# Patient Record
Sex: Female | Born: 1990 | Race: White | Hispanic: No | Marital: Married | Smoking: Never smoker
Health system: Southern US, Community
[De-identification: ages and names within clinical notes are randomized; demographics above are authoritative.]

## PROBLEM LIST (undated history)

## (undated) DIAGNOSIS — E119 Type 2 diabetes mellitus without complications: Secondary | ICD-10-CM

## (undated) DIAGNOSIS — M549 Dorsalgia, unspecified: Secondary | ICD-10-CM

## (undated) DIAGNOSIS — K760 Fatty (change of) liver, not elsewhere classified: Secondary | ICD-10-CM

## (undated) DIAGNOSIS — E282 Polycystic ovarian syndrome: Secondary | ICD-10-CM

## (undated) DIAGNOSIS — G8929 Other chronic pain: Secondary | ICD-10-CM

## (undated) DIAGNOSIS — F419 Anxiety disorder, unspecified: Secondary | ICD-10-CM

## (undated) DIAGNOSIS — Z8619 Personal history of other infectious and parasitic diseases: Secondary | ICD-10-CM

## (undated) HISTORY — DX: Type 2 diabetes mellitus without complications: E11.9

## (undated) HISTORY — PX: EYE SURGERY: SHX253

## (undated) HISTORY — DX: Polycystic ovarian syndrome: E28.2

## (undated) HISTORY — DX: Fatty (change of) liver, not elsewhere classified: K76.0

## (undated) HISTORY — DX: Anxiety disorder, unspecified: F41.9

## (undated) HISTORY — DX: Other chronic pain: G89.29

## (undated) HISTORY — DX: Dorsalgia, unspecified: M54.9

## (undated) HISTORY — DX: Personal history of other infectious and parasitic diseases: Z86.19

---

## 2010-07-06 HISTORY — PX: WISDOM TOOTH EXTRACTION: SHX21

## 2014-02-03 LAB — HM PAP SMEAR: HM Pap smear: NORMAL

## 2014-09-19 ENCOUNTER — Telehealth: Payer: Self-pay

## 2014-09-19 NOTE — Telephone Encounter (Addendum)
Medication: Review, verify sig & reconcile(including outside meds):  yes Duplicates discarded: n/a DM supply source: n/a  Local pharmacy: [id number] - [place], [place] - [address]   Allergies verified: yes  Immunization Status: Prompted for insurance verification: yes Flu vaccine--declined Tdap--4-5 years ago  A/P:   Changes to FH, PSH or Personal Hx: Reviewed and updated Pap--02/2014-normal per patient--Jill Loreta AveWagner at [facility]   Care Teams Updated: ED/Hospital/Urgent Care Visits: No Prompted for: Updated insurance, contact information, forms:  yes Remind to bring: DPR information, advance directives: yes  To Discuss with Provider: Nothing at this time.

## 2014-09-20 ENCOUNTER — Ambulatory Visit (INDEPENDENT_AMBULATORY_CARE_PROVIDER_SITE_OTHER): Payer: BC Managed Care – PPO | Admitting: Family Medicine

## 2014-09-20 ENCOUNTER — Encounter: Payer: Self-pay | Admitting: Family Medicine

## 2014-09-20 VITALS — BP 120/78 | HR 86 | Temp 98.3°F | Resp 16 | Ht 65.5 in | Wt 210.4 lb

## 2014-09-20 DIAGNOSIS — F411 Generalized anxiety disorder: Secondary | ICD-10-CM | POA: Insufficient documentation

## 2014-09-20 DIAGNOSIS — E669 Obesity, unspecified: Secondary | ICD-10-CM

## 2014-09-20 DIAGNOSIS — E282 Polycystic ovarian syndrome: Secondary | ICD-10-CM | POA: Insufficient documentation

## 2014-09-20 NOTE — Progress Notes (Signed)
   Subjective:    Patient ID: Kimberly Diaz, female    DOB: 1991-02-07, 24 y.o.   MRN: [id number]  HPI New to establish.  OB/GYN- Dennard SchaumannJill Wagoner.  PCOS- no official dx b/c no US.  Started on OCPs in 2006.  Stopped OCPs last month.  Plan is to attempt pregnancy when desired and if difficulty will have complete work up.  Obesity- pt has family hx of DM, HTN.  Pt reports that recently she is very aware of hypoglycemia sxs.  Has acanthosis nigricans since HS.  Getting ~10,000 steps daily.  Not following a particular diet but is attempting healthy choices and moderation.  Pt admits to difficulty w/ soda or sugary drinks.  No CP, SOB, HAs, visual changes, abd pain, N/V.  Anxiety- ongoing problem.  Pt feels sxs are well managed on Celexa.   Review of Systems For ROS see HPI     Objective:   Physical Exam  Constitutional: She is oriented to person, place, and time. She appears well-developed and well-nourished. No distress.  HENT:  Head: Normocephalic and atraumatic.  Eyes: Conjunctivae and EOM are normal. Pupils are equal, round, and reactive to light.  Neck: Normal range of motion. Neck supple. No thyromegaly present.  Cardiovascular: Normal rate, regular rhythm, normal heart sounds and intact distal pulses.   No murmur heard. Pulmonary/Chest: Effort normal and breath sounds normal. No respiratory distress.  Abdominal: Soft. She exhibits no distension. There is no tenderness.  Musculoskeletal: She exhibits no edema.  Lymphadenopathy:    She has no cervical adenopathy.  Neurological: She is alert and oriented to person, place, and time.  Skin: Skin is warm and dry.  Psychiatric: She has a normal mood and affect. Her behavior is normal.  Vitals reviewed.         Assessment & Plan:

## 2014-09-20 NOTE — Patient Instructions (Signed)
Schedule your complete physical in 6 months We'll notify you of your lab results and make any changes if needed Continue to make healthy food choices and get regular exercise Call with any questions or concerns Welcome!  We're glad to have you!

## 2014-09-20 NOTE — Assessment & Plan Note (Signed)
New to provider, ongoing for pt.  Just stopped OCPs.  Following w/ GYN.  This, along w/ family hx and obesity, predisposes pt to glucost intolerance.  Check labs.  Will follow.

## 2014-09-20 NOTE — Assessment & Plan Note (Signed)
New to provider, ongoing for pt.  Will check labs to risk stratify and r/o thyroid abnormality or DM.  Stressed need for healthy food choices and regular exercise.  Will follow.

## 2014-09-20 NOTE — Progress Notes (Signed)
Pre visit review using our clinic review tool, if applicable. No additional management support is needed unless otherwise documented below in the visit note. 

## 2014-09-20 NOTE — Assessment & Plan Note (Signed)
New to provider, ongoing for pt.  sxs are well controlled on current dose of Celexa.  No changes at this time.

## 2014-09-21 LAB — BASIC METABOLIC PANEL
BUN: 11 mg/dL (ref 6–23)
CO2: 29 mEq/L (ref 19–32)
Calcium: 9.5 mg/dL (ref 8.4–10.5)
Chloride: 100 mEq/L (ref 96–112)
Creatinine, Ser: 0.64 mg/dL (ref 0.40–1.20)
GFR: 120.52 mL/min (ref >60.00)
Glucose, Bld: 181 mg/dL — ABNORMAL HIGH (ref 70–99)
Potassium: 3.9 mEq/L (ref 3.5–5.1)
Sodium: 135 mEq/L (ref 135–145)

## 2014-09-21 LAB — CBC WITH DIFFERENTIAL/PLATELET
Basophils Absolute: 0.1 10*3/uL (ref 0.0–0.1)
Basophils Relative: 0.5 % (ref 0.0–3.0)
Eosinophils Absolute: 0.3 10*3/uL (ref 0.0–0.7)
Eosinophils Relative: 2.8 % (ref 0.0–5.0)
HCT: 40.7 % (ref 36.0–46.0)
Hemoglobin: 14 g/dL (ref 12.0–15.0)
Lymphocytes Relative: 31.1 % (ref 12.0–46.0)
Lymphs Abs: 3.3 10*3/uL (ref 0.7–4.0)
MCHC: 34.5 g/dL (ref 30.0–36.0)
MCV: 85.2 fl (ref 78.0–100.0)
Monocytes Absolute: 0.5 10*3/uL (ref 0.1–1.0)
Monocytes Relative: 4.5 % (ref 3.0–12.0)
Neutro Abs: 6.5 10*3/uL (ref 1.4–7.7)
Neutrophils Relative %: 61.1 % (ref 43.0–77.0)
Platelets: 346 10*3/uL (ref 150.0–400.0)
RBC: 4.78 Mil/uL (ref 3.87–5.11)
RDW: 12.6 % (ref 11.5–15.5)
WBC: 10.7 10*3/uL — ABNORMAL HIGH (ref 4.0–10.5)

## 2014-09-21 LAB — HEPATIC FUNCTION PANEL
ALT: 63 U/L — ABNORMAL HIGH (ref 0–35)
AST: 61 U/L — ABNORMAL HIGH (ref 0–37)
Albumin: 4.2 g/dL (ref 3.5–5.2)
Alkaline Phosphatase: 68 U/L (ref 39–117)
Bilirubin, Direct: 0.1 mg/dL (ref 0.0–0.3)
Total Bilirubin: 0.6 mg/dL (ref 0.2–1.2)
Total Protein: 7.8 g/dL (ref 6.0–8.3)

## 2014-09-21 LAB — LIPID PANEL
Cholesterol: 142 mg/dL (ref 0–200)
HDL: 33.8 mg/dL — ABNORMAL LOW (ref >39.00)
LDL Cholesterol: 75 mg/dL (ref 0–99)
NonHDL: 108.2
Total CHOL/HDL Ratio: 4
Triglycerides: 167 mg/dL — ABNORMAL HIGH (ref 0.0–149.0)
VLDL: 33.4 mg/dL (ref 0.0–40.0)

## 2014-09-21 LAB — HEMOGLOBIN A1C: Hgb A1c MFr Bld: 6.8 % — ABNORMAL HIGH (ref 4.6–6.5)

## 2014-09-21 LAB — TSH: TSH: 1.79 u[IU]/mL (ref 0.35–4.50)

## 2014-09-26 ENCOUNTER — Other Ambulatory Visit: Payer: Self-pay | Admitting: General Practice

## 2014-09-26 DIAGNOSIS — R7309 Other abnormal glucose: Secondary | ICD-10-CM

## 2014-09-26 MED ORDER — METFORMIN HCL 500 MG PO TABS: 500.0000 mg | ORAL_TABLET | Freq: Two times a day (BID) | ORAL | Status: DC

## 2014-10-15 ENCOUNTER — Telehealth: Payer: Self-pay | Admitting: Family Medicine

## 2014-10-15 NOTE — Telephone Encounter (Signed)
[facility] Day - Client TELEPHONE ADVICE RECORD [facility] Patient Name: Kimberly KosJESSICA Diaz DOB: 1990-11-20 Initial Comment Caller states he has a fever of 101.7 and is congested. Nurse Assessment Nurse: Elwyn LadeBurress, RN, Misty StanleyLisa Date/Time (Eastern Time): 10/15/2014 3:32:35 PM Confirm and document reason for call. If symptomatic, describe symptoms. ---Caller states he has a fever of 101.7 and is congested; began yesterday with chest congestion and began fever yesterday; feels achy and weak with neck stiffness; mild cough and hurts neck more to cough; has vomited once today Has the patient traveled out of the country within the last 30 days? ---No Does the patient require triage? ---Yes Related visit to physician within the last 2 weeks? ---No Does the PT have any chronic conditions? (i.e. diabetes, asthma, etc.) ---Yes List chronic conditions. ---NIDDM Did the patient indicate they were pregnant? ---No Guidelines Guideline Title Affirmed Question Affirmed Notes Neck Pain or Stiffness [1] Fever > 100.5 F (38.1 C) AND [2] diabetes mellitus or weak immune system (e.g., HIV positive, cancer chemo, splenectomy, organ transplant, chronic steroids) Final Disposition User See Physician within 4 Hours (or PCP triage) Burress, RN, Misty StanleyLisa Comments No appts available today, [facility]

## 2014-10-16 NOTE — Telephone Encounter (Signed)
Called patient to follow-up.  Patient states she went to urgent care and is positive for flu.  She is resting and staying hydrated.

## 2014-10-29 ENCOUNTER — Ambulatory Visit: Payer: BC Managed Care – PPO | Admitting: Family Medicine

## 2014-10-31 ENCOUNTER — Ambulatory Visit: Payer: BC Managed Care – PPO | Admitting: Family Medicine

## 2014-11-15 ENCOUNTER — Ambulatory Visit: Payer: BC Managed Care – PPO | Admitting: Family Medicine

## 2014-11-19 ENCOUNTER — Ambulatory Visit (INDEPENDENT_AMBULATORY_CARE_PROVIDER_SITE_OTHER): Payer: BC Managed Care – PPO | Admitting: Family Medicine

## 2014-11-19 ENCOUNTER — Encounter: Payer: Self-pay | Admitting: Family Medicine

## 2014-11-19 VITALS — BP 122/80 | HR 84 | Temp 98.0°F | Resp 16 | Wt 201.0 lb

## 2014-11-19 DIAGNOSIS — E119 Type 2 diabetes mellitus without complications: Secondary | ICD-10-CM | POA: Insufficient documentation

## 2014-11-19 LAB — BASIC METABOLIC PANEL
BUN: 10 mg/dL (ref 6–23)
CO2: 27 mEq/L (ref 19–32)
Calcium: 9.6 mg/dL (ref 8.4–10.5)
Chloride: 100 mEq/L (ref 96–112)
Creatinine, Ser: 0.62 mg/dL (ref 0.40–1.20)
GFR: 124.85 mL/min (ref >60.00)
Glucose, Bld: 111 mg/dL — ABNORMAL HIGH (ref 70–99)
Potassium: 3.9 mEq/L (ref 3.5–5.1)
Sodium: 135 mEq/L (ref 135–145)

## 2014-11-19 NOTE — Assessment & Plan Note (Signed)
New dx for pt.  Last labs showed A1C of 6.8.  Pt is tolerating metformin w/o difficulty.  Has lost 9 lbs in 1 month- unclear if this was due to recent illness or improved dietary choices.  Check BMP to assess Cr and glucose.  Reviewed importance of regular exercise, low carb diet and what her new dx entails- quarterly visits, routine eye exams.  Pt expressed understanding and is in agreement w/ plan.

## 2014-11-19 NOTE — Patient Instructions (Signed)
Follow up in 3 months to recheck sugars We'll notify you of your lab results and make any changes if needed Keep up the good work on healthy, low carb food choices and regular exercise Call with any questions or concerns Happy Memorial Day!!!

## 2014-11-19 NOTE — Progress Notes (Signed)
Pre visit review using our clinic review tool, if applicable. No additional management support is needed unless otherwise documented below in the visit note. 

## 2014-11-19 NOTE — Progress Notes (Signed)
   Subjective:    Patient ID: Kimberly Diaz, female    DOB: 08-14-1990, 24 y.o.   MRN: [id number]  HPI DM- new dx for pt.  At last OV, A1C was 6.8.  Was started on Metformin.  Has lost 9 lbs in last month but had flu for last 3 weeks.  No nausea, vomiting, diarrhea w/ metformin.  Pt admits to missing AM dose frequently but is at least taking medication once daily.  No abd pain.   Review of Systems For ROS see HPI     Objective:   Physical Exam  Constitutional: She is oriented to person, place, and time. She appears well-developed and well-nourished. No distress.  HENT:  Head: Normocephalic and atraumatic.  Eyes: Conjunctivae and EOM are normal. Pupils are equal, round, and reactive to light.  Neck: Normal range of motion. Neck supple. No thyromegaly present.  Cardiovascular: Normal rate, regular rhythm, normal heart sounds and intact distal pulses.   No murmur heard. Pulmonary/Chest: Effort normal and breath sounds normal. No respiratory distress.  Abdominal: Soft. She exhibits no distension. There is no tenderness.  Musculoskeletal: She exhibits no edema.  Lymphadenopathy:    She has no cervical adenopathy.  Neurological: She is alert and oriented to person, place, and time.  Skin: Skin is warm and dry.  Psychiatric: She has a normal mood and affect. Her behavior is normal.  Vitals reviewed.         Assessment & Plan:

## 2015-01-09 LAB — HM DIABETES EYE EXAM

## 2015-02-05 ENCOUNTER — Telehealth: Payer: Self-pay | Admitting: Family Medicine

## 2015-02-05 NOTE — Telephone Encounter (Signed)
Called pt and LMOVM, advised of Dr. Abner Greenspan recommendations and left number for [facility] [phone]. She should contact them for further review of testing and follow up from travels.

## 2015-02-05 NOTE — Telephone Encounter (Signed)
There is a test in the computer but I am not sure where insurance sits with this if no symptoms if she went to [facility]/immunization clinic they would know better.

## 2015-02-05 NOTE — Telephone Encounter (Signed)
Please advise, can we do testing here or should pt contact [facility]?

## 2015-02-05 NOTE — Telephone Encounter (Signed)
Caller name: Stefanee Mckell Relationship to patient: self Can be reached: [phone]  Reason for call: Pt called to see if we offer Zeka testing. She was in [place] and was bit by mosquitos. She isn't showing symptoms but wanted to test as a precaution. Please f/u with pt if needed.

## 2015-02-19 ENCOUNTER — Ambulatory Visit (INDEPENDENT_AMBULATORY_CARE_PROVIDER_SITE_OTHER): Payer: BC Managed Care – PPO | Admitting: Family Medicine

## 2015-02-19 ENCOUNTER — Encounter: Payer: Self-pay | Admitting: Family Medicine

## 2015-02-19 ENCOUNTER — Telehealth: Payer: Self-pay | Admitting: Family Medicine

## 2015-02-19 VITALS — BP 120/80 | HR 84 | Temp 98.0°F | Resp 16 | Ht 66.0 in | Wt 201.5 lb

## 2015-02-19 DIAGNOSIS — E119 Type 2 diabetes mellitus without complications: Secondary | ICD-10-CM

## 2015-02-19 LAB — BASIC METABOLIC PANEL
BUN: 13 mg/dL (ref 6–23)
CO2: 29 mEq/L (ref 19–32)
Calcium: 9.9 mg/dL (ref 8.4–10.5)
Chloride: 101 mEq/L (ref 96–112)
Creatinine, Ser: 0.57 mg/dL (ref 0.40–1.20)
GFR: 137.29 mL/min (ref >60.00)
Glucose, Bld: 99 mg/dL (ref 70–99)
Potassium: 4 mEq/L (ref 3.5–5.1)
Sodium: 136 mEq/L (ref 135–145)

## 2015-02-19 LAB — MICROALBUMIN / CREATININE URINE RATIO
Creatinine,U: 139.8 mg/dL
Microalb Creat Ratio: 0.8 mg/g (ref 0.0–30.0)
Microalb, Ur: 1.1 mg/dL (ref 0.0–1.9)

## 2015-02-19 LAB — HEMOGLOBIN A1C: Hgb A1c MFr Bld: 5.5 % (ref 4.6–6.5)

## 2015-02-19 MED ORDER — METFORMIN HCL ER 500 MG PO TB24: 500.0000 mg | ORAL_TABLET | Freq: Every day | ORAL | Status: DC

## 2015-02-19 NOTE — Progress Notes (Signed)
Pre visit review using our clinic review tool, if applicable. No additional management support is needed unless otherwise documented below in the visit note. 

## 2015-02-19 NOTE — Patient Instructions (Signed)
Schedule your complete physical in 3-4 months We'll notify you of your lab results and make any changes if needed Continue to work on healthy diet and regular exercise Call with any questions or concerns GOOD LUCK WITH BACK TO SCHOOL!!!!

## 2015-02-19 NOTE — Telephone Encounter (Signed)
will notify provider that appt is for January. Ok to add to cancellation list. For fasting it is ok for pt's to eat breakfast and skip lunch for afternoon appts.

## 2015-02-19 NOTE — Telephone Encounter (Signed)
°  Relation to EX:BMWU Call back number:[phone] Pharmacy:  Reason for call: dr.t wanted to cancel pt appt for sept for her CPE, there are no available slots for Nov. Ok to use 2 slots? Pt states she need morning appt because she has to fast

## 2015-02-19 NOTE — Assessment & Plan Note (Signed)
Pt is tolerating metformin w/o difficulty.  UTD on eye exam.  Will check microalbumin today.  Goal is to eventually wean metformin as A1C is controlled w/ healthy diet and regular exercise.  Will continue to follow closely.  Referral made to diabetes education.

## 2015-02-19 NOTE — Progress Notes (Signed)
   Subjective:    Patient ID: Kimberly Diaz, female    DOB: Nov 17, 1990, 24 y.o.   MRN: [id number]  HPI DM- ongoing issue for pt.  On Metformin  BID- will occasionally forget the AM dose.  UTD on eye exam.  Due for microalbumin and foot exam.  Not checking CBGs at home.  No CP, SOB, HAs, visual changes, abd pain, N/V.  Some symptomatic lows when pt has not eaten.  No numbness/tingling of hands/feet.   Review of Systems For ROS see HPI     Objective:   Physical Exam  Constitutional: She is oriented to person, place, and time. She appears well-developed and well-nourished. No distress.  HENT:  Head: Normocephalic and atraumatic.  Eyes: Conjunctivae and EOM are normal. Pupils are equal, round, and reactive to light.  Neck: Normal range of motion. Neck supple. No thyromegaly present.  Cardiovascular: Normal rate, regular rhythm, normal heart sounds and intact distal pulses.   No murmur heard. Pulmonary/Chest: Effort normal and breath sounds normal. No respiratory distress.  Abdominal: Soft. She exhibits no distension. There is no tenderness.  Musculoskeletal: She exhibits no edema.  Lymphadenopathy:    She has no cervical adenopathy.  Neurological: She is alert and oriented to person, place, and time.  Skin: Skin is warm and dry.  Psychiatric: She has a normal mood and affect. Her behavior is normal.  Vitals reviewed.         Assessment & Plan:

## 2015-02-19 NOTE — Telephone Encounter (Signed)
Pt has been scheduled for 07/28/14

## 2015-02-20 ENCOUNTER — Encounter: Payer: Self-pay | Admitting: General Practice

## 2015-03-13 ENCOUNTER — Ambulatory Visit: Payer: BC Managed Care – PPO | Admitting: Family Medicine

## 2015-04-09 ENCOUNTER — Ambulatory Visit: Payer: BC Managed Care – PPO | Admitting: *Deleted

## 2015-07-29 ENCOUNTER — Ambulatory Visit (INDEPENDENT_AMBULATORY_CARE_PROVIDER_SITE_OTHER): Payer: BC Managed Care – PPO | Admitting: Family Medicine

## 2015-07-29 ENCOUNTER — Encounter: Payer: Self-pay | Admitting: Family Medicine

## 2015-07-29 VITALS — BP 138/83 | HR 83 | Temp 98.2°F | Ht 66.0 in | Wt 210.0 lb

## 2015-07-29 DIAGNOSIS — Z0000 Encounter for general adult medical examination without abnormal findings: Secondary | ICD-10-CM | POA: Diagnosis not present

## 2015-07-29 DIAGNOSIS — E119 Type 2 diabetes mellitus without complications: Secondary | ICD-10-CM | POA: Diagnosis not present

## 2015-07-29 DIAGNOSIS — Z23 Encounter for immunization: Secondary | ICD-10-CM

## 2015-07-29 LAB — BASIC METABOLIC PANEL
BUN: 9 mg/dL (ref 6–23)
CO2: 25 mEq/L (ref 19–32)
Calcium: 9.1 mg/dL (ref 8.4–10.5)
Chloride: 102 mEq/L (ref 96–112)
Creatinine, Ser: 0.57 mg/dL (ref 0.40–1.20)
GFR: 136.81 mL/min (ref >60.00)
Glucose, Bld: 114 mg/dL — ABNORMAL HIGH (ref 70–99)
Potassium: 4.1 mEq/L (ref 3.5–5.1)
Sodium: 137 mEq/L (ref 135–145)

## 2015-07-29 LAB — CBC WITH DIFFERENTIAL/PLATELET
Basophils Absolute: 0 10*3/uL (ref 0.0–0.1)
Basophils Relative: 0.4 % (ref 0.0–3.0)
Eosinophils Absolute: 0.4 10*3/uL (ref 0.0–0.7)
Eosinophils Relative: 3.4 % (ref 0.0–5.0)
HCT: 43.2 % (ref 36.0–46.0)
Hemoglobin: 14.6 g/dL (ref 12.0–15.0)
Lymphocytes Relative: 33.1 % (ref 12.0–46.0)
Lymphs Abs: 3.5 10*3/uL (ref 0.7–4.0)
MCHC: 33.9 g/dL (ref 30.0–36.0)
MCV: 86.2 fl (ref 78.0–100.0)
Monocytes Absolute: 0.5 10*3/uL (ref 0.1–1.0)
Monocytes Relative: 4.5 % (ref 3.0–12.0)
Neutro Abs: 6.3 10*3/uL (ref 1.4–7.7)
Neutrophils Relative %: 58.6 % (ref 43.0–77.0)
Platelets: 346 10*3/uL (ref 150.0–400.0)
RBC: 5.01 Mil/uL (ref 3.87–5.11)
RDW: 12.7 % (ref 11.5–15.5)
WBC: 10.7 10*3/uL — ABNORMAL HIGH (ref 4.0–10.5)

## 2015-07-29 LAB — HEPATIC FUNCTION PANEL
ALT: 59 U/L — ABNORMAL HIGH (ref 0–35)
AST: 43 U/L — ABNORMAL HIGH (ref 0–37)
Albumin: 4.4 g/dL (ref 3.5–5.2)
Alkaline Phosphatase: 64 U/L (ref 39–117)
Bilirubin, Direct: 0.1 mg/dL (ref 0.0–0.3)
Total Bilirubin: 0.6 mg/dL (ref 0.2–1.2)
Total Protein: 7.8 g/dL (ref 6.0–8.3)

## 2015-07-29 LAB — HEMOGLOBIN A1C: Hgb A1c MFr Bld: 5.7 % (ref 4.6–6.5)

## 2015-07-29 LAB — LIPID PANEL
Cholesterol: 144 mg/dL (ref 0–200)
HDL: 36.1 mg/dL — ABNORMAL LOW (ref >39.00)
LDL Cholesterol: 78 mg/dL (ref 0–99)
NonHDL: 107.64
Total CHOL/HDL Ratio: 4
Triglycerides: 147 mg/dL (ref 0.0–149.0)
VLDL: 29.4 mg/dL (ref 0.0–40.0)

## 2015-07-29 LAB — VITAMIN D 25 HYDROXY (VIT D DEFICIENCY, FRACTURES): VITD: 22.55 ng/mL — ABNORMAL LOW (ref 30.00–100.00)

## 2015-07-29 LAB — TSH: TSH: 1.59 u[IU]/mL (ref 0.35–4.50)

## 2015-07-29 NOTE — Progress Notes (Signed)
Pre visit review using our clinic review tool, if applicable. No additional management support is needed unless otherwise documented below in the visit note. 

## 2015-07-29 NOTE — Addendum Note (Signed)
Addended by: Lurline Hare on: 07/29/2015 08:46 AM   Modules accepted: Orders

## 2015-07-29 NOTE — Assessment & Plan Note (Signed)
Pt's PE WNL w/ exception of obesity.  UTD on GYN.  Check labs.  Anticipatory guidance provided.  

## 2015-07-29 NOTE — Patient Instructions (Signed)
Follow up in 6 months to recheck sugar and A1C We'll notify you of your lab results and make any changes if needed Continue to work on healthy diet and regular exercise- you can do it! Call with any questions or concerns If you want to join [place] at the new [place] office, any scheduled appointments will automatically transfer and we will see you at [address] [place] [address], [place], [place] [address] (OPENING [place]) Happy New Year!!!

## 2015-07-29 NOTE — Assessment & Plan Note (Signed)
Chronic problem.  Byproduct of her PCOS.  A1C's have been very well controlled.  Now on 1500 mg Metformin daily per GYN.  UTD on foot exam, eye exam, microalbumin.  Check labs.  Adjust meds prn

## 2015-07-29 NOTE — Progress Notes (Signed)
   Subjective:    Patient ID: Kimberly Diaz, female    DOB: 1990/10/14, 25 y.o.   MRN: [id number]  HPI CPE- UTD on pap w/ Dr Ardelle Anton.  UTD on foot exam, eye exam, microalbumin.  Pt has gained 8 lbs since August.  Pt is currently on  Metformin XR w/ Dr Ardelle Anton.  Also on Femara and Progestin for cycle regularity.   Review of Systems Patient reports no vision/ hearing changes, adenopathy,fever, weight change,  persistant/recurrent hoarseness , swallowing issues, chest pain, palpitations, edema, persistant/recurrent cough, hemoptysis, dyspnea (rest/exertional/paroxysmal nocturnal), gastrointestinal bleeding (melena, rectal bleeding), abdominal pain, significant heartburn, bowel changes, GU symptoms (dysuria, hematuria, incontinence), Gyn symptoms (abnormal  bleeding, pain),  syncope, focal weakness, memory loss, numbness & tingling, skin/hair/nail changes, abnormal bruising or bleeding, anxiety, or depression.     Objective:   Physical Exam General Appearance:    Alert, cooperative, no distress, appears stated age, obese  Head:    Normocephalic, without obvious abnormality, atraumatic  Eyes:    PERRL, conjunctiva/corneas clear, EOM's intact, fundi    benign, both eyes  Ears:    Normal TM's and external ear canals, both ears  Nose:   Nares normal, septum midline, mucosa normal, no drainage    or sinus tenderness  Throat:   Lips, mucosa, and tongue normal; teeth and gums normal  Neck:   Supple, symmetrical, trachea midline, no adenopathy;    Thyroid: no enlargement/tenderness/nodules  Back:     Symmetric, no curvature, ROM normal, no CVA tenderness  Lungs:     Clear to auscultation bilaterally, respirations unlabored  Chest Wall:    No tenderness or deformity   Heart:    Regular rate and rhythm, S1 and S2 normal, no murmur, rub   or gallop  Breast Exam:    Deferred to GYN  Abdomen:     Soft, non-tender, bowel sounds active all four quadrants,    no masses, no organomegaly  Genitalia:     Deferred to GYN  Rectal:    Extremities:   Extremities normal, atraumatic, no cyanosis or edema  Pulses:   2+ and symmetric all extremities  Skin:   Skin color, texture, turgor normal, no rashes or lesions  Lymph nodes:   Cervical, supraclavicular, and axillary nodes normal  Neurologic:   CNII-XII intact, normal strength, sensation and reflexes    throughout          Assessment & Plan:

## 2015-07-30 ENCOUNTER — Other Ambulatory Visit: Payer: Self-pay

## 2015-07-30 ENCOUNTER — Telehealth: Payer: Self-pay | Admitting: Family Medicine

## 2015-07-30 MED ORDER — VITAMIN D (ERGOCALCIFEROL) 1.25 MG (50000 UNIT) PO CAPS: 50000.0000 [IU] | ORAL_CAPSULE | ORAL | Status: DC

## 2015-07-30 NOTE — Telephone Encounter (Signed)
Pt is returning your call for lab results   Please assist.  CB: [phone]   Thanks.

## 2015-08-02 ENCOUNTER — Telehealth: Payer: Self-pay | Admitting: Family Medicine

## 2015-08-02 ENCOUNTER — Ambulatory Visit: Payer: BC Managed Care – PPO | Admitting: Medical

## 2015-08-02 ENCOUNTER — Encounter: Payer: Self-pay | Admitting: Family Medicine

## 2015-08-02 ENCOUNTER — Ambulatory Visit (INDEPENDENT_AMBULATORY_CARE_PROVIDER_SITE_OTHER): Payer: BC Managed Care – PPO | Admitting: Family Medicine

## 2015-08-02 VITALS — BP 121/75 | HR 98 | Temp 98.0°F | Resp 20 | Wt 211.5 lb

## 2015-08-02 DIAGNOSIS — H65192 Other acute nonsuppurative otitis media, left ear: Secondary | ICD-10-CM | POA: Insufficient documentation

## 2015-08-02 DIAGNOSIS — J029 Acute pharyngitis, unspecified: Secondary | ICD-10-CM | POA: Diagnosis not present

## 2015-08-02 DIAGNOSIS — J039 Acute tonsillitis, unspecified: Secondary | ICD-10-CM | POA: Diagnosis not present

## 2015-08-02 LAB — POCT RAPID STREP A (OFFICE): Rapid Strep A Screen: NEGATIVE

## 2015-08-02 MED ORDER — PREDNISONE 50 MG PO TABS
50.0000 mg | ORAL_TABLET | Freq: Every day | ORAL | Status: DC
Start: 2015-08-02 — End: 2016-11-08

## 2015-08-02 MED ORDER — AMOXICILLIN-POT CLAVULANATE 875-125 MG PO TABS: 1.0000 | ORAL_TABLET | Freq: Two times a day (BID) | ORAL | Status: DC

## 2015-08-02 NOTE — Patient Instructions (Signed)

## 2015-08-02 NOTE — Telephone Encounter (Signed)
Relation to BJ:YNWG Call back [phone]   Reason for call:  Patient scheduled an acute appointment today at 1pm with KUNEFF, RENEE A at the [facility] office patient prefers to see PCP, patient wanted me to inform PCP is there anyway she can be squeezed in today. Please advise

## 2015-08-02 NOTE — Progress Notes (Signed)
Patient ID: Kimberly Diaz, female   DOB: 28-Oct-1990, 25 y.o.   MRN: [id number]   Subjective:    Patient ID: Kimberly Diaz   DOB: 10-Dec-1990, 25 y.o.    MRN: [id number]  HPI  Sore throat: Patient states she woke this morning with an extreme sore throat, it brings tears to swallow. She is feeling achy, mild congestion and fatigued.  She has been able to eat and drink. No fever, chills,nausea, vomit, diarrhea or rash. She is a first [employer]. She denies shortness of breath,but states it is difficult to swallow her own saliva.  Flu UTD 4 days ago, UTD with tdap    Past Medical History  Diagnosis Date  . PCOS (polycystic ovarian syndrome)     possible   . Chronic back pain     muscle stiffness  . Diabetes mellitus without complication (HCC)    No Known Allergies Past Surgical History  Procedure Laterality Date  . Wisdom tooth extraction  2012   Social History  Substance Use Topics  . Smoking status: Never Smoker   . Smokeless tobacco: Never Used  . Alcohol Use: Yes    Review of Systems Negative, with the exception of above mentioned in HPI     Objective:   Physical Exam BP 121/75 mmHg  Pulse 98  Temp(Src) 98 F (36.7 C)  Resp 20  Wt 211 lb 8 oz (95.936 kg)  SpO2 96%  LMP 07/27/2015 Body mass index is 34.15 kg/(m^2). Gen: Afebrile. No acute distress. Nontoxic in appearance. Well developed, well nourished, obese pleasant female HENT: AT. [place]. Bilateral TM visualized, with left TM effusion, mild erythema. MMM, no oral lesions. Bilateral nares with erythema and swelling. Throat with erythema, no exudates. Enlarged tonsils.  Cough not present, Hoarseness present TTP facial sinus not present. Difficulty swallowing Eyes:Pupils Equal Round Reactive to light, Extraocular movements intact,  Conjunctiva without redness, discharge or icterus. Neck/lymp/endocrine: Supple, bilateral shotty cervical anterior lymphadenopathy CV: RRR  Chest: CTAB, no wheeze or crackles Abd: Soft.  NTND. BS present Skin: no rashes, purpura or petechiae.    Assessment & Plan:  Kimberly Diaz is a 25 y.o. present for acute OV tonsillitis and left ear effusion.  1. Sore throat/tonsillitis/Acute effusion of left ear - POCT rapid strep A--> Negative.  - Flu  Swab --> negative - augmentin prescribed. Prednisone burst - rest, hydrate - F/U PRN

## 2015-09-13 ENCOUNTER — Telehealth: Payer: Self-pay | Admitting: Family Medicine

## 2015-09-13 ENCOUNTER — Ambulatory Visit: Payer: BC Managed Care – PPO | Admitting: Family Medicine

## 2015-09-13 NOTE — Telephone Encounter (Signed)
Please see if this is pt's first no show/late cancellation

## 2015-09-13 NOTE — Telephone Encounter (Signed)
Pt scheduled appt this morning at 8:11. Pt called back and  lvm at 12:43 to cancel appt for today at 2:15.   Should pt be charged?

## 2015-09-16 ENCOUNTER — Encounter: Payer: Self-pay | Admitting: Family Medicine

## 2015-09-16 NOTE — Telephone Encounter (Signed)
Waiving fee - mailing no show letter

## 2015-09-16 NOTE — Telephone Encounter (Signed)
No charge since 1st no-show.  There will be a charge in the future if no reason given

## 2015-09-16 NOTE — Telephone Encounter (Signed)
1st w/in 12 months

## 2015-11-13 DIAGNOSIS — N926 Irregular menstruation, unspecified: Secondary | ICD-10-CM | POA: Diagnosis not present

## 2015-12-30 DIAGNOSIS — N926 Irregular menstruation, unspecified: Secondary | ICD-10-CM | POA: Diagnosis not present

## 2016-01-06 LAB — HEMOGLOBIN A1C: Hemoglobin A1C: 6.6

## 2016-01-09 DIAGNOSIS — E119 Type 2 diabetes mellitus without complications: Secondary | ICD-10-CM | POA: Diagnosis not present

## 2016-01-09 DIAGNOSIS — N970 Female infertility associated with anovulation: Secondary | ICD-10-CM | POA: Diagnosis not present

## 2016-01-09 DIAGNOSIS — E282 Polycystic ovarian syndrome: Secondary | ICD-10-CM | POA: Diagnosis not present

## 2016-01-09 DIAGNOSIS — Z7984 Long term (current) use of oral hypoglycemic drugs: Secondary | ICD-10-CM | POA: Diagnosis not present

## 2016-01-09 DIAGNOSIS — F419 Anxiety disorder, unspecified: Secondary | ICD-10-CM | POA: Diagnosis not present

## 2016-01-09 DIAGNOSIS — Z79899 Other long term (current) drug therapy: Secondary | ICD-10-CM | POA: Diagnosis not present

## 2016-01-15 LAB — HM DIABETES EYE EXAM

## 2016-01-27 ENCOUNTER — Ambulatory Visit: Payer: BC Managed Care – PPO | Admitting: Family Medicine

## 2016-02-06 ENCOUNTER — Ambulatory Visit (INDEPENDENT_AMBULATORY_CARE_PROVIDER_SITE_OTHER): Payer: BC Managed Care – PPO | Admitting: Family Medicine

## 2016-02-06 ENCOUNTER — Encounter: Payer: Self-pay | Admitting: Family Medicine

## 2016-02-06 VITALS — BP 122/84 | HR 82 | Temp 98.6°F | Resp 16 | Ht 66.0 in | Wt 204.4 lb

## 2016-02-06 DIAGNOSIS — E119 Type 2 diabetes mellitus without complications: Secondary | ICD-10-CM | POA: Diagnosis not present

## 2016-02-06 DIAGNOSIS — Z3141 Encounter for fertility testing: Secondary | ICD-10-CM | POA: Diagnosis not present

## 2016-02-06 LAB — LIPID PANEL
Cholesterol: 162 mg/dL (ref 0–200)
HDL: 41.9 mg/dL (ref >39.00)
LDL Cholesterol: 92 mg/dL (ref 0–99)
NonHDL: 120.08
Total CHOL/HDL Ratio: 4
Triglycerides: 139 mg/dL (ref 0.0–149.0)
VLDL: 27.8 mg/dL (ref 0.0–40.0)

## 2016-02-06 LAB — BASIC METABOLIC PANEL
BUN: 6 mg/dL (ref 6–23)
CO2: 28 mEq/L (ref 19–32)
Calcium: 9.8 mg/dL (ref 8.4–10.5)
Chloride: 99 mEq/L (ref 96–112)
Creatinine, Ser: 0.54 mg/dL (ref 0.40–1.20)
GFR: 145.01 mL/min (ref >60.00)
Glucose, Bld: 91 mg/dL (ref 70–99)
Potassium: 4.3 mEq/L (ref 3.5–5.1)
Sodium: 136 mEq/L (ref 135–145)

## 2016-02-06 LAB — HEPATIC FUNCTION PANEL
ALT: 111 U/L — ABNORMAL HIGH (ref 0–35)
AST: 149 U/L — ABNORMAL HIGH (ref 0–37)
Albumin: 4.6 g/dL (ref 3.5–5.2)
Alkaline Phosphatase: 63 U/L (ref 39–117)
Bilirubin, Direct: 0.2 mg/dL (ref 0.0–0.3)
Total Bilirubin: 1 mg/dL (ref 0.2–1.2)
Total Protein: 8.1 g/dL (ref 6.0–8.3)

## 2016-02-06 LAB — MICROALBUMIN / CREATININE URINE RATIO
Creatinine,U: 144.4 mg/dL
Microalb Creat Ratio: 2.4 mg/g (ref 0.0–30.0)
Microalb, Ur: 3.5 mg/dL — ABNORMAL HIGH (ref 0.0–1.9)

## 2016-02-06 MED ORDER — METFORMIN HCL ER (MOD) 1000 MG PO TB24
2000.0000 mg | ORAL_TABLET | Freq: Every day | ORAL | 3 refills | Status: DC
Start: 2016-02-06 — End: 2016-03-27

## 2016-02-06 NOTE — Progress Notes (Signed)
Pre visit review using our clinic review tool, if applicable. No additional management support is needed unless otherwise documented below in the visit note. 

## 2016-02-06 NOTE — Assessment & Plan Note (Signed)
Pt's A1C had climbed to 6.6 last month and fertility specialist increased Metformin to 2000mg  XR daily.  UTD on eye exam.  Foot exam done today.  Microalbumin collected.  Stressed need for low carb diet and regular exercise.  Will follow.

## 2016-02-06 NOTE — Progress Notes (Signed)
   Subjective:    Patient ID: Kimberly Diaz, female    DOB: [birth year], 25 y.o.   MRN: [id number]  HPI DM- new dx.  Had labs checked last month w/ fertility specialist and A1C was 6.6  Metformin was increased to 2000mg  XR daily.  Not having N/V/D w/ medication changes.  Due for microalbumin, foot exam.  UTD on eye exam- .  Denies symptomatic lows.  No CP, SOB, HAs, visual changes, edema, numbness/tingling of hands/feet.   Review of Systems For ROS see HPI     Objective:   Physical Exam  Constitutional: She is oriented to person, place, and time. She appears well-developed and well-nourished. No distress.  obese  HENT:  Head: Normocephalic and atraumatic.  Eyes: Conjunctivae and EOM are normal. Pupils are equal, round, and reactive to light.  Neck: Normal range of motion. Neck supple. No thyromegaly present.  Cardiovascular: Normal rate, regular rhythm, normal heart sounds and intact distal pulses.   No murmur heard. Pulmonary/Chest: Effort normal and breath sounds normal. No respiratory distress.  Abdominal: Soft. She exhibits no distension. There is no tenderness.  Musculoskeletal: She exhibits no edema.  Lymphadenopathy:    She has no cervical adenopathy.  Neurological: She is alert and oriented to person, place, and time.  Skin: Skin is warm and dry.  Psychiatric: She has a normal mood and affect. Her behavior is normal.  Vitals reviewed.         Assessment & Plan:

## 2016-02-06 NOTE — Patient Instructions (Signed)
Schedule your complete physical for after Jan 23rd [facility] notify you of your lab results and make any changes if needed Continue to work on healthy diet and regular exercise- you can do it!! Call with any questions or concerns GOOD LUCK!!!!

## 2016-02-11 ENCOUNTER — Other Ambulatory Visit: Payer: Self-pay | Admitting: Family Medicine

## 2016-02-11 DIAGNOSIS — R945 Abnormal results of liver function studies: Principal | ICD-10-CM

## 2016-02-11 DIAGNOSIS — R7989 Other specified abnormal findings of blood chemistry: Secondary | ICD-10-CM

## 2016-02-12 DIAGNOSIS — Z3141 Encounter for fertility testing: Secondary | ICD-10-CM | POA: Diagnosis not present

## 2016-02-15 DIAGNOSIS — Z3141 Encounter for fertility testing: Secondary | ICD-10-CM | POA: Diagnosis not present

## 2016-02-18 DIAGNOSIS — Z3141 Encounter for fertility testing: Secondary | ICD-10-CM | POA: Diagnosis not present

## 2016-02-26 ENCOUNTER — Other Ambulatory Visit (INDEPENDENT_AMBULATORY_CARE_PROVIDER_SITE_OTHER): Payer: BC Managed Care – PPO

## 2016-02-26 DIAGNOSIS — R945 Abnormal results of liver function studies: Principal | ICD-10-CM

## 2016-02-26 DIAGNOSIS — R7989 Other specified abnormal findings of blood chemistry: Secondary | ICD-10-CM

## 2016-02-26 LAB — HEPATIC FUNCTION PANEL
ALT: 91 U/L — ABNORMAL HIGH (ref 6–29)
AST: 97 U/L — ABNORMAL HIGH (ref 10–30)
Albumin: 4.4 g/dL (ref 3.6–5.1)
Alkaline Phosphatase: 60 U/L (ref 33–115)
Bilirubin, Direct: 0.1 mg/dL (ref <=0.2)
Indirect Bilirubin: 0.5 mg/dL (ref 0.2–1.2)
Total Bilirubin: 0.6 mg/dL (ref 0.2–1.2)
Total Protein: 7.1 g/dL (ref 6.1–8.1)

## 2016-02-27 ENCOUNTER — Other Ambulatory Visit: Payer: Self-pay | Admitting: Family Medicine

## 2016-02-27 DIAGNOSIS — R7989 Other specified abnormal findings of blood chemistry: Secondary | ICD-10-CM

## 2016-02-27 DIAGNOSIS — R945 Abnormal results of liver function studies: Principal | ICD-10-CM

## 2016-02-27 LAB — HEPATITIS PANEL, ACUTE
HCV Ab: NEGATIVE
Hep A IgM: NONREACTIVE
Hep B C IgM: NONREACTIVE
Hepatitis B Surface Ag: NEGATIVE

## 2016-02-27 NOTE — Progress Notes (Signed)
Called pt and lmovm to inform of the lab results and to schedule a follow up appt.  labs ordered

## 2016-03-04 DIAGNOSIS — Z3200 Encounter for pregnancy test, result unknown: Secondary | ICD-10-CM | POA: Diagnosis not present

## 2016-03-05 DIAGNOSIS — Z3200 Encounter for pregnancy test, result unknown: Secondary | ICD-10-CM | POA: Diagnosis not present

## 2016-03-06 DIAGNOSIS — Z3200 Encounter for pregnancy test, result unknown: Secondary | ICD-10-CM | POA: Diagnosis not present

## 2016-03-11 ENCOUNTER — Telehealth: Payer: Self-pay | Admitting: Emergency Medicine

## 2016-03-11 NOTE — Telephone Encounter (Signed)
[facility] approved for Metformin XR covered by the [facility] [employer] [facility] Approved 03/10/2016-03/10/2017 [facility] by [facility] of [facility]

## 2016-03-25 DIAGNOSIS — O0901 Supervision of pregnancy with history of infertility, first trimester: Secondary | ICD-10-CM | POA: Diagnosis not present

## 2016-03-25 DIAGNOSIS — Z3A00 Weeks of gestation of pregnancy not specified: Secondary | ICD-10-CM | POA: Diagnosis not present

## 2016-03-26 ENCOUNTER — Telehealth: Payer: Self-pay | Admitting: Family Medicine

## 2016-03-26 NOTE — Telephone Encounter (Signed)
Pt states that the metforin that was sent into pharmacy is not covered by ins and asking for a generic to be sent in. Pt states that she wants to stay on 1000mg  [facility] [address]

## 2016-03-27 MED ORDER — METFORMIN HCL ER (OSM) 1000 MG PO TB24: 1000.0000 mg | ORAL_TABLET | Freq: Every day | ORAL | 6 refills | Status: DC

## 2016-03-27 NOTE — Telephone Encounter (Signed)
Medication filled to pharmacy as requested.   

## 2016-03-30 LAB — HM PAP SMEAR

## 2016-04-01 DIAGNOSIS — O0901 Supervision of pregnancy with history of infertility, first trimester: Secondary | ICD-10-CM | POA: Diagnosis not present

## 2016-04-01 DIAGNOSIS — Z36 Encounter for antenatal screening of mother: Secondary | ICD-10-CM | POA: Diagnosis not present

## 2016-04-01 DIAGNOSIS — Z6834 Body mass index (BMI) 34.0-34.9, adult: Secondary | ICD-10-CM | POA: Diagnosis not present

## 2016-04-01 DIAGNOSIS — Z01419 Encounter for gynecological examination (general) (routine) without abnormal findings: Secondary | ICD-10-CM | POA: Diagnosis not present

## 2016-04-01 DIAGNOSIS — Z124 Encounter for screening for malignant neoplasm of cervix: Secondary | ICD-10-CM | POA: Diagnosis not present

## 2016-04-09 DIAGNOSIS — Z3A00 Weeks of gestation of pregnancy not specified: Secondary | ICD-10-CM | POA: Diagnosis not present

## 2016-04-09 DIAGNOSIS — O09811 Supervision of pregnancy resulting from assisted reproductive technology, first trimester: Secondary | ICD-10-CM | POA: Diagnosis not present

## 2016-04-28 ENCOUNTER — Encounter: Payer: Self-pay | Admitting: Family Medicine

## 2016-04-28 ENCOUNTER — Telehealth: Payer: Self-pay | Admitting: Family Medicine

## 2016-04-28 NOTE — Telephone Encounter (Signed)
Left voicemail having patient to contact her GYN to find out which OTC medications best to take during her pregnancy for possible sinus infection.

## 2016-04-28 NOTE — Telephone Encounter (Signed)
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

## 2016-04-28 NOTE — Telephone Encounter (Signed)
Pt would need to contact GYN for that recommendation.

## 2016-04-29 DIAGNOSIS — N760 Acute vaginitis: Secondary | ICD-10-CM | POA: Diagnosis not present

## 2016-04-29 DIAGNOSIS — Z3480 Encounter for supervision of other normal pregnancy, unspecified trimester: Secondary | ICD-10-CM | POA: Diagnosis not present

## 2016-04-29 DIAGNOSIS — Z3682 Encounter for antenatal screening for nuchal translucency: Secondary | ICD-10-CM | POA: Diagnosis not present

## 2016-04-29 DIAGNOSIS — Z6833 Body mass index (BMI) 33.0-33.9, adult: Secondary | ICD-10-CM | POA: Diagnosis not present

## 2016-04-29 LAB — OB RESULTS CONSOLE ABO/RH: RH Type: POSITIVE

## 2016-04-29 LAB — OB RESULTS CONSOLE GC/CHLAMYDIA
Chlamydia: NEGATIVE
Gonorrhea: NEGATIVE

## 2016-04-29 LAB — OB RESULTS CONSOLE HIV ANTIBODY (ROUTINE TESTING): HIV: NONREACTIVE

## 2016-04-29 LAB — OB RESULTS CONSOLE HEPATITIS B SURFACE ANTIGEN: Hepatitis B Surface Ag: NEGATIVE

## 2016-04-29 LAB — OB RESULTS CONSOLE RUBELLA ANTIBODY, IGM: Rubella: IMMUNE

## 2016-04-29 LAB — OB RESULTS CONSOLE RPR: RPR: NONREACTIVE

## 2016-04-29 LAB — OB RESULTS CONSOLE ANTIBODY SCREEN: Antibody Screen: NEGATIVE

## 2016-05-21 ENCOUNTER — Encounter: Payer: BC Managed Care – PPO | Attending: Family Medicine | Admitting: Skilled Nursing Facility1

## 2016-05-21 ENCOUNTER — Encounter: Payer: Self-pay | Admitting: Skilled Nursing Facility1

## 2016-05-21 DIAGNOSIS — Z713 Dietary counseling and surveillance: Secondary | ICD-10-CM | POA: Diagnosis not present

## 2016-05-21 DIAGNOSIS — Z3A15 15 weeks gestation of pregnancy: Secondary | ICD-10-CM | POA: Insufficient documentation

## 2016-05-21 DIAGNOSIS — O24112 Pre-existing diabetes mellitus, type 2, in pregnancy, second trimester: Secondary | ICD-10-CM | POA: Insufficient documentation

## 2016-05-21 DIAGNOSIS — O24111 Pre-existing diabetes mellitus, type 2, in pregnancy, first trimester: Secondary | ICD-10-CM

## 2016-05-21 NOTE — Patient Instructions (Addendum)
-  Try to go for a walk 4 times a week for 30 minutes  -How many Carb choices do I need:   How is my wt  How's my energy level  What are my numbers  How is my hunger  -Check your feet every day   -Always bring your meter with you everywhere you go -Always Properly dispose of your needles:  -Discard in a hard plastic/metal container with a lid (something the needle can't puncture)  -Write Do Not Recycle on the outside of the container  -Example: A laundry detergent bottle -Never use the same needle more than once

## 2016-05-21 NOTE — Progress Notes (Signed)
Diabetes Self-Management Education  Visit Type: First/Initial  Appt. Start Time: 4:00 Appt. End Time: 5:00  05/21/2016  [name], identified by name and date of birth, is a 25 y.o. female with a diagnosis of Diabetes: Type 2.   ASSESSMENT  Height 5' 5.5" (1.664 m), weight 198 lb (89.8 kg), last menstrual period 07/27/2015. Body mass index is 32.45 kg/m. Pt states she is currently pregnant. Pt states in July her A1C was 6.6. Pt states she is [redacted] weeks along in her pregnancy. Pt states she has had her feet and eyes checked which came out normal. Pt states since conception she has lost 10 pounds. Pt checked her blood sugar with a number of 89 having eaten at 12:30.     Diabetes Self-Management Education - 05/21/16 1559      Visit Information   Visit Type First/Initial     Initial Visit   Diabetes Type Type 2   Are you taking your medications as prescribed? Yes   Date Diagnosed 2016     Health Coping   How would you rate your overall health? Good     Psychosocial Assessment   Patient Belief/Attitude about Diabetes Afraid     Pre-Education Assessment   Patient understands the diabetes disease and treatment process. Needs Instruction   Patient understands incorporating nutritional management into lifestyle. Needs Instruction   Patient undertands incorporating physical activity into lifestyle. Needs Instruction   Patient understands using medications safely. Needs Instruction   Patient understands monitoring blood glucose, interpreting and using results Needs Instruction   Patient understands prevention, detection, and treatment of acute complications. Needs Instruction   Patient understands prevention, detection, and treatment of chronic complications. Needs Instruction   Patient understands how to develop strategies to address psychosocial issues. Needs Instruction   Patient understands how to develop strategies to promote health/change behavior. Needs Instruction      Complications   Last HgB A1C per patient/outside source 6.6 %   How often do you check your blood sugar? 1-2 times/day   Fasting Blood glucose range (mg/dL) [phone]   Have you had a dilated eye exam in the past 12 months? Yes   Have you had a dental exam in the past 12 months? Yes   Are you checking your feet? No     Exercise   Exercise Type Light (walking / raking leaves)   How many days per week to you exercise? 2   How many minutes per day do you exercise? 30   Total minutes per week of exercise 60     Patient Education   Previous Diabetes Education No   Disease state  Factors that contribute to the development of diabetes   Nutrition management  Role of diet in the treatment of diabetes and the relationship between the three main macronutrients and blood glucose level;Food label reading, portion sizes and measuring food.;Carbohydrate counting;Reviewed blood glucose goals for pre and post meals and how to evaluate the patients' food intake on their blood glucose level.;Meal timing in regards to the patients' current diabetes medication.;Effects of alcohol on blood glucose and safety factors with consumption of alcohol.;Information on hints to eating out and maintain blood glucose control.   Physical activity and exercise  Role of exercise on diabetes management, blood pressure control and cardiac health.;Identified with patient nutritional and/or medication changes necessary with exercise.;Helped patient identify appropriate exercises in relation to his/her diabetes, diabetes complications and other health issue.   Monitoring Purpose and frequency of SMBG.;Daily foot  exams;Yearly dilated eye exam   Chronic complications Lipid levels, blood glucose control and heart disease;Dental care;Assessed and discussed foot care and prevention of foot problems   Psychosocial adjustment Worked with patient to identify barriers to care and solutions;Role of stress on diabetes;Helped patient identify a  support system for diabetes management   Preconception care Reviewed with patient blood glucose goals with pregnancy;Role of family planning for patients with diabetes     Individualized Goals (developed by patient)   Nutrition Follow meal plan discussed;Adjust meds/carbs with exercise as discussed   Physical Activity Exercise 1-2 times per week;15 minutes per day   Medications take my medication as prescribed   Monitoring  test my blood glucose as discussed;test blood glucose pre and post meals as discussed     Post-Education Assessment   Patient understands the diabetes disease and treatment process. Demonstrates understanding / competency   Patient understands incorporating nutritional management into lifestyle. Demonstrates understanding / competency   Patient undertands incorporating physical activity into lifestyle. Demonstrates understanding / competency   Patient understands using medications safely. Demonstrates understanding / competency   Patient understands monitoring blood glucose, interpreting and using results Demonstrates understanding / competency   Patient understands prevention, detection, and treatment of acute complications. Demonstrates understanding / competency   Patient understands prevention, detection, and treatment of chronic complications. Demonstrates understanding / competency   Patient understands how to develop strategies to address psychosocial issues. Demonstrates understanding / competency   Patient understands how to develop strategies to promote health/change behavior. Demonstrates understanding / competency     Outcomes   Expected Outcomes Demonstrated interest in learning. Expect positive outcomes   Future DMSE PRN   Program Status Completed      Individualized Plan for Diabetes Self-Management Training:   Learning Objective:  Patient will have a greater understanding of diabetes self-management. Patient education plan is to attend individual  and/or group sessions per assessed needs and concerns.   Plan:   Patient Instructions  -Try to go for a walk 4 times a week for 30 minutes  -How many Carb choices do I need:   How is my wt  How's my energy level  What are my numbers  How is my hunger  -Check your feet every day   -Always bring your meter with you everywhere you go -Always Properly dispose of your needles:  -Discard in a hard plastic/metal container with a lid (something the needle can't puncture)  -Write Do Not Recycle on the outside of the container  -Example: A laundry detergent bottle -Never use the same needle more than once    Expected Outcomes:  Demonstrated interest in learning. Expect positive outcomes  Education material provided: Living Well with Diabetes, Food label handouts, Meal plan card, My Plate, Snack sheet and Carbohydrate counting sheet  If problems or questions, patient to contact team via:  Phone  Future DSME appointment: PRN

## 2016-05-26 DIAGNOSIS — Z3480 Encounter for supervision of other normal pregnancy, unspecified trimester: Secondary | ICD-10-CM | POA: Diagnosis not present

## 2016-06-15 DIAGNOSIS — Z363 Encounter for antenatal screening for malformations: Secondary | ICD-10-CM | POA: Diagnosis not present

## 2016-07-14 DIAGNOSIS — O99810 Abnormal glucose complicating pregnancy: Secondary | ICD-10-CM | POA: Diagnosis not present

## 2016-07-14 DIAGNOSIS — O359XX1 Maternal care for (suspected) fetal abnormality and damage, unspecified, fetus 1: Secondary | ICD-10-CM | POA: Diagnosis not present

## 2016-07-29 ENCOUNTER — Encounter: Payer: BC Managed Care – PPO | Admitting: Family Medicine

## 2016-08-06 DIAGNOSIS — O24119 Pre-existing diabetes mellitus, type 2, in pregnancy, unspecified trimester: Secondary | ICD-10-CM | POA: Diagnosis not present

## 2016-08-26 DIAGNOSIS — Z6834 Body mass index (BMI) 34.0-34.9, adult: Secondary | ICD-10-CM | POA: Diagnosis not present

## 2016-08-26 DIAGNOSIS — Z23 Encounter for immunization: Secondary | ICD-10-CM | POA: Diagnosis not present

## 2016-09-02 DIAGNOSIS — E119 Type 2 diabetes mellitus without complications: Secondary | ICD-10-CM | POA: Diagnosis not present

## 2016-09-02 DIAGNOSIS — Z6834 Body mass index (BMI) 34.0-34.9, adult: Secondary | ICD-10-CM | POA: Diagnosis not present

## 2016-09-17 DIAGNOSIS — Z6833 Body mass index (BMI) 33.0-33.9, adult: Secondary | ICD-10-CM | POA: Diagnosis not present

## 2016-09-17 DIAGNOSIS — E119 Type 2 diabetes mellitus without complications: Secondary | ICD-10-CM | POA: Diagnosis not present

## 2016-09-24 DIAGNOSIS — E119 Type 2 diabetes mellitus without complications: Secondary | ICD-10-CM | POA: Diagnosis not present

## 2016-09-24 DIAGNOSIS — Z6833 Body mass index (BMI) 33.0-33.9, adult: Secondary | ICD-10-CM | POA: Diagnosis not present

## 2016-09-30 DIAGNOSIS — Z6833 Body mass index (BMI) 33.0-33.9, adult: Secondary | ICD-10-CM | POA: Diagnosis not present

## 2016-09-30 DIAGNOSIS — E119 Type 2 diabetes mellitus without complications: Secondary | ICD-10-CM | POA: Diagnosis not present

## 2016-10-08 DIAGNOSIS — O24113 Pre-existing diabetes mellitus, type 2, in pregnancy, third trimester: Secondary | ICD-10-CM | POA: Diagnosis not present

## 2016-10-08 DIAGNOSIS — Z3480 Encounter for supervision of other normal pregnancy, unspecified trimester: Secondary | ICD-10-CM | POA: Diagnosis not present

## 2016-10-08 LAB — OB RESULTS CONSOLE GBS: GBS: NEGATIVE

## 2016-10-12 LAB — OB RESULTS CONSOLE RUBELLA ANTIBODY, IGM: Rubella: NON-IMMUNE/NOT IMMUNE

## 2016-10-13 DIAGNOSIS — Z6835 Body mass index (BMI) 35.0-35.9, adult: Secondary | ICD-10-CM | POA: Diagnosis not present

## 2016-10-13 DIAGNOSIS — O24119 Pre-existing diabetes mellitus, type 2, in pregnancy, unspecified trimester: Secondary | ICD-10-CM | POA: Diagnosis not present

## 2016-10-16 DIAGNOSIS — O24113 Pre-existing diabetes mellitus, type 2, in pregnancy, third trimester: Secondary | ICD-10-CM | POA: Diagnosis not present

## 2016-10-16 DIAGNOSIS — Z6835 Body mass index (BMI) 35.0-35.9, adult: Secondary | ICD-10-CM | POA: Diagnosis not present

## 2016-10-19 DIAGNOSIS — Z6835 Body mass index (BMI) 35.0-35.9, adult: Secondary | ICD-10-CM | POA: Diagnosis not present

## 2016-10-19 DIAGNOSIS — O24119 Pre-existing diabetes mellitus, type 2, in pregnancy, unspecified trimester: Secondary | ICD-10-CM | POA: Diagnosis not present

## 2016-10-22 DIAGNOSIS — E119 Type 2 diabetes mellitus without complications: Secondary | ICD-10-CM | POA: Diagnosis not present

## 2016-10-26 DIAGNOSIS — O24119 Pre-existing diabetes mellitus, type 2, in pregnancy, unspecified trimester: Secondary | ICD-10-CM | POA: Diagnosis not present

## 2016-10-26 DIAGNOSIS — Z6835 Body mass index (BMI) 35.0-35.9, adult: Secondary | ICD-10-CM | POA: Diagnosis not present

## 2016-10-29 ENCOUNTER — Encounter (HOSPITAL_COMMUNITY): Payer: Self-pay | Admitting: *Deleted

## 2016-10-29 ENCOUNTER — Telehealth (HOSPITAL_COMMUNITY): Payer: Self-pay | Admitting: *Deleted

## 2016-10-29 DIAGNOSIS — E119 Type 2 diabetes mellitus without complications: Secondary | ICD-10-CM | POA: Diagnosis not present

## 2016-10-29 DIAGNOSIS — Z6835 Body mass index (BMI) 35.0-35.9, adult: Secondary | ICD-10-CM | POA: Diagnosis not present

## 2016-10-29 NOTE — Telephone Encounter (Signed)
Preadmission screen  

## 2016-10-30 MED ORDER — INSULIN ASPART 100 UNIT/ML ~~LOC~~ SOLN
5.0000 [IU] | Freq: Three times a day (TID) | SUBCUTANEOUS | Status: DC
  Filled 2016-10-30: qty 0.05

## 2016-10-30 MED ORDER — DEXTROSE IN LACTATED RINGERS 5 % IV SOLN
INTRAVENOUS | Status: DC
  Filled 2016-10-30: qty 1000

## 2016-11-02 DIAGNOSIS — O24113 Pre-existing diabetes mellitus, type 2, in pregnancy, third trimester: Secondary | ICD-10-CM | POA: Diagnosis not present

## 2016-11-05 ENCOUNTER — Inpatient Hospital Stay (HOSPITAL_COMMUNITY): Payer: BC Managed Care – PPO | Admitting: Anesthesiology

## 2016-11-05 ENCOUNTER — Inpatient Hospital Stay (HOSPITAL_COMMUNITY)
Admission: RE | Admit: 2016-11-05 | Discharge: 2016-11-08 | DRG: 766 | Disposition: A | Discharge status: Home / Self Care | Payer: BC Managed Care – PPO | Source: Ambulatory Visit | Attending: Obstetrics | Admitting: Obstetrics

## 2016-11-05 ENCOUNTER — Encounter (HOSPITAL_COMMUNITY): Payer: Self-pay

## 2016-11-05 VITALS — BP 159/78 | HR 58 | Temp 97.8°F | Resp 19 | Ht 65.0 in | Wt 211.0 lb

## 2016-11-05 DIAGNOSIS — F419 Anxiety disorder, unspecified: Secondary | ICD-10-CM | POA: Diagnosis present

## 2016-11-05 DIAGNOSIS — Z3A39 39 weeks gestation of pregnancy: Secondary | ICD-10-CM | POA: Diagnosis not present

## 2016-11-05 DIAGNOSIS — E119 Type 2 diabetes mellitus without complications: Secondary | ICD-10-CM | POA: Diagnosis present

## 2016-11-05 DIAGNOSIS — Z9889 Other specified postprocedural states: Secondary | ICD-10-CM

## 2016-11-05 DIAGNOSIS — Z23 Encounter for immunization: Secondary | ICD-10-CM | POA: Diagnosis not present

## 2016-11-05 DIAGNOSIS — Z794 Long term (current) use of insulin: Secondary | ICD-10-CM

## 2016-11-05 DIAGNOSIS — O99344 Other mental disorders complicating childbirth: Secondary | ICD-10-CM | POA: Diagnosis present

## 2016-11-05 DIAGNOSIS — O2412 Pre-existing diabetes mellitus, type 2, in childbirth: Secondary | ICD-10-CM | POA: Diagnosis not present

## 2016-11-05 DIAGNOSIS — O324XX0 Maternal care for high head at term, not applicable or unspecified: Secondary | ICD-10-CM | POA: Diagnosis present

## 2016-11-05 DIAGNOSIS — O24113 Pre-existing diabetes mellitus, type 2, in pregnancy, third trimester: Secondary | ICD-10-CM | POA: Diagnosis not present

## 2016-11-05 DIAGNOSIS — Z3A00 Weeks of gestation of pregnancy not specified: Secondary | ICD-10-CM | POA: Diagnosis not present

## 2016-11-05 LAB — GLUCOSE, CAPILLARY
Glucose-Capillary: 91 mg/dL (ref 65–99)
Glucose-Capillary: 85 mg/dL (ref 65–99)
Glucose-Capillary: 78 mg/dL (ref 65–99)
Glucose-Capillary: 77 mg/dL (ref 65–99)
Glucose-Capillary: 78 mg/dL (ref 65–99)
Glucose-Capillary: 73 mg/dL (ref 65–99)
Glucose-Capillary: 81 mg/dL (ref 65–99)
Glucose-Capillary: 116 mg/dL — ABNORMAL HIGH (ref 65–99)

## 2016-11-05 LAB — BASIC METABOLIC PANEL
Anion gap: 8 (ref 5–15)
BUN: 7 mg/dL (ref 6–20)
CO2: 22 mmol/L (ref 22–32)
Calcium: 8.8 mg/dL — ABNORMAL LOW (ref 8.9–10.3)
Chloride: 103 mmol/L (ref 101–111)
Creatinine, Ser: 0.49 mg/dL (ref 0.44–1.00)
GFR calc Af Amer: >60 mL/min (ref >60)
GFR calc non Af Amer: >60 mL/min (ref >60)
Glucose, Bld: 78 mg/dL (ref 65–99)
Potassium: 3.8 mmol/L (ref 3.5–5.1)
Sodium: 133 mmol/L — ABNORMAL LOW (ref 135–145)

## 2016-11-05 LAB — CBC
HCT: 34.5 % — ABNORMAL LOW (ref 36.0–46.0)
Hemoglobin: 11.5 g/dL — ABNORMAL LOW (ref 12.0–15.0)
MCH: 27.4 pg (ref 26.0–34.0)
MCHC: 33.3 g/dL (ref 30.0–36.0)
MCV: 82.3 fL (ref 78.0–100.0)
Platelets: 298 10*3/uL (ref 150–400)
RBC: 4.19 MIL/uL (ref 3.87–5.11)
RDW: 15.1 % (ref 11.5–15.5)
WBC: 11.7 10*3/uL — ABNORMAL HIGH (ref 4.0–10.5)

## 2016-11-05 LAB — TYPE AND SCREEN
ABO/RH(D): B POS
Antibody Screen: NEGATIVE

## 2016-11-05 LAB — RPR: RPR Ser Ql: NONREACTIVE

## 2016-11-05 LAB — ABO/RH: ABO/RH(D): B POS

## 2016-11-05 MED ORDER — ACETAMINOPHEN 325 MG PO TABS: 650.0000 mg | ORAL_TABLET | ORAL | Status: DC | PRN

## 2016-11-05 MED ORDER — LIDOCAINE HCL (PF) 1 % IJ SOLN
30.0000 mL | INTRAMUSCULAR | Status: DC | PRN
  Filled 2016-11-05: qty 30

## 2016-11-05 MED ORDER — LIDOCAINE HCL (PF) 1 % IJ SOLN
INTRAMUSCULAR | Status: DC | PRN
  Administered 2016-11-05: 4 mL
  Administered 2016-11-05: 6 mL via EPIDURAL

## 2016-11-05 MED ORDER — OXYTOCIN BOLUS FROM INFUSION: 500.0000 mL | Freq: Once | INTRAVENOUS | Status: DC

## 2016-11-05 MED ORDER — PHENYLEPHRINE 40 MCG/ML (10ML) SYRINGE FOR IV PUSH (FOR BLOOD PRESSURE SUPPORT)
80.0000 ug | PREFILLED_SYRINGE | INTRAVENOUS | Status: DC | PRN
  Administered 2016-11-05: 80 ug via INTRAVENOUS

## 2016-11-05 MED ORDER — INSULIN ASPART 100 UNIT/ML ~~LOC~~ SOLN: 0.0000 [IU] | Freq: Three times a day (TID) | SUBCUTANEOUS | Status: DC

## 2016-11-05 MED ORDER — SOD CITRATE-CITRIC ACID 500-334 MG/5ML PO SOLN
30.0000 mL | ORAL | Status: DC | PRN
  Administered 2016-11-06: 30 mL via ORAL
  Filled 2016-11-05: qty 15

## 2016-11-05 MED ORDER — PHENYLEPHRINE 200 MCG/ML FOR PRIAPISM / HYPOTENSION: 80.0000 ug | Freq: Once | INTRAMUSCULAR | Status: DC

## 2016-11-05 MED ORDER — LACTATED RINGERS IV SOLN
500.0000 mL | INTRAVENOUS | Status: DC | PRN
  Administered 2016-11-05 (×3): 500 mL via INTRAVENOUS

## 2016-11-05 MED ORDER — FENTANYL 2.5 MCG/ML BUPIVACAINE 1/10 % EPIDURAL INFUSION (WH - ANES)
INTRAMUSCULAR | Status: AC
  Filled 2016-11-05: qty 100

## 2016-11-05 MED ORDER — LACTATED RINGERS IV SOLN: 500.0000 mL | Freq: Once | INTRAVENOUS | Status: DC

## 2016-11-05 MED ORDER — OXYTOCIN 40 UNITS IN LACTATED RINGERS INFUSION - SIMPLE MED
1.0000 m[IU]/min | INTRAVENOUS | Status: DC
  Administered 2016-11-05: 2 m[IU]/min via INTRAVENOUS
  Administered 2016-11-05: 18 m[IU]/min via INTRAVENOUS

## 2016-11-05 MED ORDER — EPHEDRINE 5 MG/ML INJ: 10.0000 mg | INTRAVENOUS | Status: DC | PRN

## 2016-11-05 MED ORDER — FENTANYL CITRATE (PF) 100 MCG/2ML IJ SOLN: 50.0000 ug | INTRAMUSCULAR | Status: DC | PRN

## 2016-11-05 MED ORDER — ONDANSETRON HCL 4 MG/2ML IJ SOLN: 4.0000 mg | Freq: Four times a day (QID) | INTRAMUSCULAR | Status: DC | PRN

## 2016-11-05 MED ORDER — LACTATED RINGERS IV SOLN
INTRAVENOUS | Status: DC
  Administered 2016-11-05 – 2016-11-06 (×3): via INTRAVENOUS

## 2016-11-05 MED ORDER — OXYCODONE-ACETAMINOPHEN 5-325 MG PO TABS: 1.0000 | ORAL_TABLET | ORAL | Status: DC | PRN

## 2016-11-05 MED ORDER — FENTANYL 2.5 MCG/ML BUPIVACAINE 1/10 % EPIDURAL INFUSION (WH - ANES)
14.0000 mL/h | INTRAMUSCULAR | Status: DC | PRN
  Administered 2016-11-05 (×2): 14 mL/h via EPIDURAL
  Filled 2016-11-05: qty 100

## 2016-11-05 MED ORDER — TERBUTALINE SULFATE 1 MG/ML IJ SOLN: 0.2500 mg | Freq: Once | INTRAMUSCULAR | Status: DC | PRN

## 2016-11-05 MED ORDER — PHENYLEPHRINE 40 MCG/ML (10ML) SYRINGE FOR IV PUSH (FOR BLOOD PRESSURE SUPPORT): 80.0000 ug | PREFILLED_SYRINGE | INTRAVENOUS | Status: DC | PRN

## 2016-11-05 MED ORDER — DIPHENHYDRAMINE HCL 50 MG/ML IJ SOLN: 12.5000 mg | INTRAMUSCULAR | Status: DC | PRN

## 2016-11-05 MED ORDER — PHENYLEPHRINE 40 MCG/ML (10ML) SYRINGE FOR IV PUSH (FOR BLOOD PRESSURE SUPPORT)
PREFILLED_SYRINGE | INTRAVENOUS | Status: AC
  Filled 2016-11-05: qty 20

## 2016-11-05 MED ORDER — OXYCODONE-ACETAMINOPHEN 5-325 MG PO TABS: 2.0000 | ORAL_TABLET | ORAL | Status: DC | PRN

## 2016-11-05 MED ORDER — OXYTOCIN 40 UNITS IN LACTATED RINGERS INFUSION - SIMPLE MED
2.5000 [IU]/h | INTRAVENOUS | Status: DC
  Filled 2016-11-05: qty 1000

## 2016-11-05 NOTE — Anesthesia Preprocedure Evaluation (Addendum)
Anesthesia Evaluation  Patient identified by MRN, date of birth, ID band Patient awake    Reviewed: Allergy & Precautions, H&P , Patient's Chart, lab work & pertinent test results  Airway Mallampati: II  TM Distance: >3 FB Neck ROM: full    Dental  (+) Teeth Intact   Pulmonary    breath sounds clear to auscultation       Cardiovascular  Rhythm:regular Rate:Normal     Neuro/Psych    GI/Hepatic   Endo/Other  diabetes, Type 2  Renal/GU      Musculoskeletal   Abdominal   Peds  Hematology   Anesthesia Other Findings       Reproductive/Obstetrics (+) Pregnancy                             Anesthesia Physical Anesthesia Plan  ASA: II  Anesthesia Plan: Epidural   Post-op Pain Management:    Induction:   Airway Management Planned:   Additional Equipment:   Intra-op Plan:   Post-operative Plan:   Informed Consent: I have reviewed the patients History and Physical, chart, labs and discussed the procedure including the risks, benefits and alternatives for the proposed anesthesia with the patient or authorized representative who has indicated his/her understanding and acceptance.   Dental Advisory Given  Plan Discussed with: CRNA and Surgeon  Anesthesia Plan Comments: (Labs checked- platelets confirmed with RN in room. Fetal heart tracing, per RN, reported to be stable enough for sitting procedure. Discussed epidural, and patient consents to the procedure:  included risk of possible headache,backache, failed block, allergic reaction, and nerve injury. This patient was asked if she had any questions or concerns before the procedure started. For C/S with labor epidural)       Anesthesia Quick Evaluation

## 2016-11-05 NOTE — Progress Notes (Signed)
Mild discomfort w ctx  BP 137/86 (BP Location: Left Arm)   Pulse 78   Temp 98.7 F (37.1 C) (Oral)   Resp 20   Ht 5\' 5"  (1.651 m)   Wt 95.7 kg (211 lb)   LMP 02/05/2016   BMI 35.11 kg/m   Toco: q3-4 min  EFM: 130s, mod var, + accels SVE: 4/60/-3/posterior, AROM clear fluid   BG: 70-90s  A&P: g1 @ 328w1d w IOL for DM2 Cont to titrate pitocin Epidural upon request BG at goal FSR / vtx

## 2016-11-05 NOTE — Anesthesia Procedure Notes (Signed)
Epidural Patient location during procedure: OB  Staffing Anesthesiologist: Dael Howland  Preanesthetic Checklist Completed: patient identified, site marked, surgical consent, pre-op evaluation, timeout performed, IV checked, risks and benefits discussed and monitors and equipment checked  Epidural Patient position: sitting Prep: site prepped and draped and DuraPrep Patient monitoring: continuous pulse ox and blood pressure Approach: midline Location: L3-L4 Injection technique: LOR air  Needle:  Needle type: Tuohy  Needle gauge: 17 G Needle length: 9 cm and 9 Needle insertion depth: 6 cm Catheter type: closed end flexible Catheter size: 19 Gauge Catheter at skin depth: 12 cm Test dose: negative  Assessment Events: blood not aspirated, injection not painful, no injection resistance, negative IV test and no paresthesia  Additional Notes Dosing of Epidural:  1st dose, through catheter .............................................  Xylocaine 40 mg  2nd dose, through catheter, after waiting 3 minutes.........Xylocaine 60 mg    As each dose occurred, patient was free of IV sx; and patient exhibited no evidence of SA injection.  Patient is more comfortable after epidural dosed. Please see RN's note for documentation of vital signs,and FHR which are stable.  Patient reminded not to try to ambulate with numb legs, and that an RN must be present when she attempts to get up.        

## 2016-11-05 NOTE — Anesthesia Pain Management Evaluation Note (Signed)
  CRNA Pain Management Visit Note  Patient: Kimberly Diaz, 26 y.o., female  "Hello I am a member of the anesthesia team at [facility]. We have an anesthesia team available at all times to provide care throughout the hospital, including epidural management and anesthesia for C-section. I don't know your plan for the delivery whether it a natural birth, water birth, IV sedation, nitrous supplementation, doula or epidural, but we want to meet your pain goals."   1.Was your pain managed to your expectations on prior hospitalizations?   No prior hospitalizations  2.What is your expectation for pain management during this hospitalization?     Epidural  3.How can we help you reach that goal?   Record the patient's initial score and the patient's pain goal.   Pain: 0  Pain Goal: 5 [facility] wants you to be able to say your pain was always managed very well.  Kimberly EmperorMalinova,Daveda Larock Diaz 11/05/2016

## 2016-11-05 NOTE — H&P (Signed)
26 y.o. G1P0 @ 3171w1d presents with for IOL for DM2.  Otherwise has good fetal movement and no bleeding.  Pregnancy c/b:  1. Type 2 DM:  Was on metformin 2000mg  qhs prior to start of pregnancy.  Now also on insulin (NPH 36/12, Novolog 0/6/6).  Last growth US on 3/28 5lb 15oz, 70%).  A1c was 6.6 prior to pregnancy, most recent 4.6 at 23 weeks.  Normal fetal ECHO 2. Anxiety: on celexa  Past Medical History:  Diagnosis Date  . Anxiety   . Chronic back pain    muscle stiffness  . Diabetes mellitus without complication (HCC)    metformin  . Fatty liver   . Hx of varicella   . PCOS (polycystic ovarian syndrome)    possible     Past Surgical History:  Procedure Laterality Date  . WISDOM TOOTH EXTRACTION  2012    OB History  Gravida Para Term Preterm AB Living  1            SAB TAB Ectopic Multiple Live Births               # Outcome Date GA Lbr Len/2nd Weight Sex Delivery Anes PTL Lv  1 Current               Social History   Social History  . Marital status: Married    Spouse name: N/A  . Number of children: N/A  . Years of education: N/A   Occupational History  . Not on file.   Social History Main Topics  . Smoking status: Never Smoker  . Smokeless tobacco: Never Used  . Alcohol use No  . Drug use: No  . Sexual activity: Yes    Birth control/ protection: Other-see comments     Comment: will discuss with MD    Other Topics Concern  . Not on file   Social History Narrative  . No narrative on file   Patient has no known allergies.    Prenatal Transfer Tool  Maternal Diabetes: Yes:  Diabetes Type:  Pre-pregnancy Genetic Screening: Normal Maternal Ultrasounds/Referrals: Normal Fetal Ultrasounds or other Referrals:  Fetal echo--wnl Maternal Substance Abuse:  No Significant Maternal Medications:  Meds include: Other:  celexa, metformin, insulin Significant Maternal Lab Results: Lab values include: Group B Strep negative, hgb A1c 4.6 at 23 wks  ABO, Rh:  B/Positive/-- (10/25 0000) Antibody: Negative (10/25 0000) Rubella: Nonimmune (04/09 0000) RPR: Nonreactive (10/25 0000)  HBsAg: Negative (10/25 0000)  HIV: Non-reactive (10/25 0000)  GBS: Negative (04/05 0000)     Vitals:   11/05/16 1013 11/05/16 1043  BP: 139/89 138/88  Pulse: 82 76  Resp: 18 18  Temp:       General:  NAD Abdomen:  soft, gravid, EFW 8# Ex:  trace edema SVE:  3/60/-3 FHTs:  130s, mod var, + accels, no decels Toco:  quiet  BG 91 on admission  A/P   26 y.o. G1P0 4971w1d presents with IOL for type 2 DM Admit to L&D Cervix favorable--start pitocin.  Epidural upon request DM2: Took AM NPH.  q2h FSBS with goal < 120.  SSI for meal coverage.  Insulin gtt as needed  FSR/ vtx/ GBS neg  Kayren Holck GEFFEL Cylah Fannin

## 2016-11-06 ENCOUNTER — Encounter (HOSPITAL_COMMUNITY)
Admission: RE | Disposition: A | Discharge status: Home / Self Care | Payer: Self-pay | Source: Ambulatory Visit | Attending: Obstetrics

## 2016-11-06 ENCOUNTER — Encounter (HOSPITAL_COMMUNITY): Payer: Self-pay

## 2016-11-06 LAB — GLUCOSE, CAPILLARY
Glucose-Capillary: 109 mg/dL — ABNORMAL HIGH (ref 65–99)
Glucose-Capillary: 133 mg/dL — ABNORMAL HIGH (ref 65–99)
Glucose-Capillary: 142 mg/dL — ABNORMAL HIGH (ref 65–99)
Glucose-Capillary: 67 mg/dL (ref 65–99)
Glucose-Capillary: 67 mg/dL (ref 65–99)
Glucose-Capillary: 70 mg/dL (ref 65–99)
Glucose-Capillary: 77 mg/dL (ref 65–99)
Glucose-Capillary: 79 mg/dL (ref 65–99)
Glucose-Capillary: 89 mg/dL (ref 65–99)

## 2016-11-06 SURGERY — Surgical Case
Anesthesia: Epidural

## 2016-11-06 MED ORDER — HYDROMORPHONE HCL 1 MG/ML IJ SOLN: 0.2500 mg | INTRAMUSCULAR | Status: DC | PRN

## 2016-11-06 MED ORDER — DIBUCAINE 1 % RE OINT: 1.0000 "application " | TOPICAL_OINTMENT | RECTAL | Status: DC | PRN

## 2016-11-06 MED ORDER — OXYTOCIN 40 UNITS IN LACTATED RINGERS INFUSION - SIMPLE MED: 2.5000 [IU]/h | INTRAVENOUS | Status: AC

## 2016-11-06 MED ORDER — NALBUPHINE HCL 10 MG/ML IJ SOLN: 5.0000 mg | INTRAMUSCULAR | Status: DC | PRN

## 2016-11-06 MED ORDER — CEFAZOLIN SODIUM-DEXTROSE 2-4 GM/100ML-% IV SOLN
2.0000 g | Freq: Once | INTRAVENOUS | Status: AC
  Administered 2016-11-06: 2 g via INTRAVENOUS

## 2016-11-06 MED ORDER — MORPHINE SULFATE (PF) 0.5 MG/ML IJ SOLN
INTRAMUSCULAR | Status: AC
  Filled 2016-11-06: qty 10

## 2016-11-06 MED ORDER — MENTHOL 3 MG MT LOZG: 1.0000 | LOZENGE | OROMUCOSAL | Status: DC | PRN

## 2016-11-06 MED ORDER — IBUPROFEN 600 MG PO TABS
600.0000 mg | ORAL_TABLET | Freq: Four times a day (QID) | ORAL | Status: DC | PRN
  Administered 2016-11-06: 600 mg via ORAL

## 2016-11-06 MED ORDER — ACETAMINOPHEN 500 MG PO TABS
1000.0000 mg | ORAL_TABLET | Freq: Four times a day (QID) | ORAL | Status: AC
  Administered 2016-11-06 (×2): 1000 mg via ORAL
  Filled 2016-11-06 (×2): qty 2

## 2016-11-06 MED ORDER — SIMETHICONE 80 MG PO CHEW: 80.0000 mg | CHEWABLE_TABLET | ORAL | Status: DC | PRN

## 2016-11-06 MED ORDER — ONDANSETRON HCL 4 MG/2ML IJ SOLN
INTRAMUSCULAR | Status: DC | PRN
  Administered 2016-11-06: 4 mg via INTRAVENOUS

## 2016-11-06 MED ORDER — TETANUS-DIPHTH-ACELL PERTUSSIS 5-2.5-18.5 LF-MCG/0.5 IM SUSP: 0.5000 mL | Freq: Once | INTRAMUSCULAR | Status: DC

## 2016-11-06 MED ORDER — SIMETHICONE 80 MG PO CHEW
80.0000 mg | CHEWABLE_TABLET | ORAL | Status: DC
  Administered 2016-11-06 – 2016-11-08 (×2): 80 mg via ORAL
  Filled 2016-11-06 (×2): qty 1

## 2016-11-06 MED ORDER — MEPERIDINE HCL 25 MG/ML IJ SOLN: 6.2500 mg | INTRAMUSCULAR | Status: DC | PRN

## 2016-11-06 MED ORDER — MEPERIDINE HCL 25 MG/ML IJ SOLN
INTRAMUSCULAR | Status: DC | PRN
  Administered 2016-11-06 (×2): 12.5 mg via INTRAVENOUS

## 2016-11-06 MED ORDER — DIPHENHYDRAMINE HCL 50 MG/ML IJ SOLN: 12.5000 mg | INTRAMUSCULAR | Status: DC | PRN

## 2016-11-06 MED ORDER — OXYTOCIN 10 UNIT/ML IJ SOLN
INTRAVENOUS | Status: DC | PRN
  Administered 2016-11-06: 40 [IU] via INTRAVENOUS

## 2016-11-06 MED ORDER — NALOXONE HCL 0.4 MG/ML IJ SOLN: 0.4000 mg | INTRAMUSCULAR | Status: DC | PRN

## 2016-11-06 MED ORDER — DEXTROSE 5 % IV SOLN
INTRAVENOUS | Status: AC
  Filled 2016-11-06: qty 3000

## 2016-11-06 MED ORDER — SCOPOLAMINE 1 MG/3DAYS TD PT72
MEDICATED_PATCH | TRANSDERMAL | Status: DC | PRN
  Administered 2016-11-06: 1 via TRANSDERMAL

## 2016-11-06 MED ORDER — OXYTOCIN 10 UNIT/ML IJ SOLN
INTRAMUSCULAR | Status: AC
  Filled 2016-11-06: qty 4

## 2016-11-06 MED ORDER — SCOPOLAMINE 1 MG/3DAYS TD PT72
MEDICATED_PATCH | TRANSDERMAL | Status: AC
  Filled 2016-11-06: qty 1

## 2016-11-06 MED ORDER — INSULIN NPH (HUMAN) (ISOPHANE) 100 UNIT/ML ~~LOC~~ SUSP
18.0000 [IU] | Freq: Every day | SUBCUTANEOUS | Status: DC
  Filled 2016-11-06: qty 10

## 2016-11-06 MED ORDER — PROMETHAZINE HCL 25 MG/ML IJ SOLN: 6.2500 mg | INTRAMUSCULAR | Status: DC | PRN

## 2016-11-06 MED ORDER — MORPHINE SULFATE (PF) 0.5 MG/ML IJ SOLN
INTRAMUSCULAR | Status: DC | PRN
  Administered 2016-11-06: 4 mg via EPIDURAL
  Administered 2016-11-06: 1 mg via INTRAVENOUS

## 2016-11-06 MED ORDER — OXYCODONE HCL 5 MG PO TABS: 5.0000 mg | ORAL_TABLET | ORAL | Status: DC | PRN

## 2016-11-06 MED ORDER — ACETAMINOPHEN 10 MG/ML IV SOLN: 1000.0000 mg | Freq: Once | INTRAVENOUS | Status: DC | PRN

## 2016-11-06 MED ORDER — CITALOPRAM HYDROBROMIDE 40 MG PO TABS
40.0000 mg | ORAL_TABLET | Freq: Every day | ORAL | Status: DC
  Administered 2016-11-06 – 2016-11-08 (×3): 40 mg via ORAL
  Filled 2016-11-06 (×4): qty 1

## 2016-11-06 MED ORDER — ONDANSETRON HCL 4 MG/2ML IJ SOLN
INTRAMUSCULAR | Status: AC
  Filled 2016-11-06: qty 2

## 2016-11-06 MED ORDER — LACTATED RINGERS IV SOLN
INTRAVENOUS | Status: DC
  Administered 2016-11-06: 14:00:00 via INTRAVENOUS

## 2016-11-06 MED ORDER — DIPHENHYDRAMINE HCL 25 MG PO CAPS: 25.0000 mg | ORAL_CAPSULE | ORAL | Status: DC | PRN

## 2016-11-06 MED ORDER — NALOXONE HCL 2 MG/2ML IJ SOSY
1.0000 ug/kg/h | PREFILLED_SYRINGE | INTRAVENOUS | Status: DC | PRN
  Filled 2016-11-06: qty 2

## 2016-11-06 MED ORDER — SCOPOLAMINE 1 MG/3DAYS TD PT72
1.0000 | MEDICATED_PATCH | Freq: Once | TRANSDERMAL | Status: DC
  Filled 2016-11-06: qty 1

## 2016-11-06 MED ORDER — NALBUPHINE HCL 10 MG/ML IJ SOLN: 5.0000 mg | Freq: Once | INTRAMUSCULAR | Status: DC | PRN

## 2016-11-06 MED ORDER — SODIUM BICARBONATE 8.4 % IV SOLN
INTRAVENOUS | Status: DC | PRN
  Administered 2016-11-06: 10 mL via EPIDURAL

## 2016-11-06 MED ORDER — ACETAMINOPHEN 325 MG PO TABS
650.0000 mg | ORAL_TABLET | ORAL | Status: DC | PRN
  Administered 2016-11-07: 650 mg via ORAL
  Filled 2016-11-06: qty 2

## 2016-11-06 MED ORDER — COCONUT OIL OIL
1.0000 "application " | TOPICAL_OIL | Status: DC | PRN
  Administered 2016-11-06: 1 via TOPICAL
  Filled 2016-11-06: qty 120

## 2016-11-06 MED ORDER — SENNOSIDES-DOCUSATE SODIUM 8.6-50 MG PO TABS
2.0000 | ORAL_TABLET | ORAL | Status: DC
  Administered 2016-11-06 – 2016-11-08 (×2): 2 via ORAL
  Filled 2016-11-06 (×2): qty 2

## 2016-11-06 MED ORDER — INSULIN NPH (HUMAN) (ISOPHANE) 100 UNIT/ML ~~LOC~~ SUSP
6.0000 [IU] | Freq: Every day | SUBCUTANEOUS | Status: DC
  Filled 2016-11-06 (×8): qty 10

## 2016-11-06 MED ORDER — LACTATED RINGERS IV SOLN
INTRAVENOUS | Status: DC | PRN
  Administered 2016-11-06: 03:00:00 via INTRAVENOUS

## 2016-11-06 MED ORDER — DIPHENHYDRAMINE HCL 25 MG PO CAPS
25.0000 mg | ORAL_CAPSULE | Freq: Four times a day (QID) | ORAL | Status: DC | PRN
Start: 2016-11-06 — End: 2016-11-08

## 2016-11-06 MED ORDER — MEPERIDINE HCL 25 MG/ML IJ SOLN
INTRAMUSCULAR | Status: AC
  Filled 2016-11-06: qty 1

## 2016-11-06 MED ORDER — IBUPROFEN 600 MG PO TABS
600.0000 mg | ORAL_TABLET | Freq: Four times a day (QID) | ORAL | Status: DC
  Administered 2016-11-06 – 2016-11-08 (×7): 600 mg via ORAL
  Filled 2016-11-06 (×8): qty 1

## 2016-11-06 MED ORDER — SODIUM CHLORIDE 0.9% FLUSH: 3.0000 mL | INTRAVENOUS | Status: DC | PRN

## 2016-11-06 MED ORDER — SODIUM CHLORIDE 0.9 % IR SOLN
Status: DC | PRN
  Administered 2016-11-06: 1000 mL

## 2016-11-06 MED ORDER — METFORMIN HCL ER 500 MG PO TB24
1000.0000 mg | ORAL_TABLET | Freq: Every day | ORAL | Status: DC
  Administered 2016-11-06: 1000 mg via ORAL
  Filled 2016-11-06: qty 2

## 2016-11-06 MED ORDER — CEFAZOLIN SODIUM-DEXTROSE 2-4 GM/100ML-% IV SOLN
INTRAVENOUS | Status: AC
  Filled 2016-11-06: qty 100

## 2016-11-06 MED ORDER — KETOROLAC TROMETHAMINE 30 MG/ML IJ SOLN: 30.0000 mg | Freq: Four times a day (QID) | INTRAMUSCULAR | Status: AC | PRN

## 2016-11-06 MED ORDER — SIMETHICONE 80 MG PO CHEW
80.0000 mg | CHEWABLE_TABLET | Freq: Three times a day (TID) | ORAL | Status: DC
  Administered 2016-11-06 – 2016-11-08 (×7): 80 mg via ORAL
  Filled 2016-11-06 (×7): qty 1

## 2016-11-06 MED ORDER — INSULIN ASPART 100 UNIT/ML ~~LOC~~ SOLN: 0.0000 [IU] | Freq: Three times a day (TID) | SUBCUTANEOUS | Status: DC

## 2016-11-06 MED ORDER — PRENATAL MULTIVITAMIN CH
1.0000 | ORAL_TABLET | Freq: Every day | ORAL | Status: DC
  Administered 2016-11-07 – 2016-11-08 (×2): 1 via ORAL
  Filled 2016-11-06 (×2): qty 1

## 2016-11-06 MED ORDER — OXYCODONE HCL 5 MG PO TABS
10.0000 mg | ORAL_TABLET | ORAL | Status: DC | PRN
Start: 2016-11-06 — End: 2016-11-08

## 2016-11-06 MED ORDER — WITCH HAZEL-GLYCERIN EX PADS: 1.0000 "application " | MEDICATED_PAD | CUTANEOUS | Status: DC | PRN

## 2016-11-06 MED ORDER — ONDANSETRON HCL 4 MG/2ML IJ SOLN: 4.0000 mg | Freq: Three times a day (TID) | INTRAMUSCULAR | Status: DC | PRN

## 2016-11-06 SURGICAL SUPPLY — 35 items
BENZOIN TINCTURE PRP APPL 2/3 (GAUZE/BANDAGES/DRESSINGS) ×2 IMPLANT
CHLORAPREP W/TINT 26ML (MISCELLANEOUS) ×2 IMPLANT
CLAMP CORD UMBIL (MISCELLANEOUS) IMPLANT
CLOTH BEACON ORANGE TIMEOUT ST (SAFETY) ×2 IMPLANT
DRSG OPSITE POSTOP 4X10 (GAUZE/BANDAGES/DRESSINGS) ×2 IMPLANT
ELECT REM PT RETURN 9FT ADLT (ELECTROSURGICAL) ×2
ELECTRODE REM PT RTRN 9FT ADLT (ELECTROSURGICAL) ×1 IMPLANT
EXTRACTOR VACUUM KIWI (MISCELLANEOUS) IMPLANT
GLOVE BIOGEL PI IND STRL 6.5 (GLOVE) ×1 IMPLANT
GLOVE BIOGEL PI IND STRL 7.0 (GLOVE) ×1 IMPLANT
GLOVE BIOGEL PI INDICATOR 6.5 (GLOVE) ×1
GLOVE BIOGEL PI INDICATOR 7.0 (GLOVE) ×1
GLOVE ECLIPSE 6.0 STRL STRAW (GLOVE) ×2 IMPLANT
GOWN STRL REUS W/TWL LRG LVL3 (GOWN DISPOSABLE) ×4 IMPLANT
KIT ABG SYR 3ML LUER SLIP (SYRINGE) IMPLANT
NEEDLE HYPO 25X5/8 SAFETYGLIDE (NEEDLE) IMPLANT
NS IRRIG 1000ML POUR BTL (IV SOLUTION) ×2 IMPLANT
PACK C SECTION WH (CUSTOM PROCEDURE TRAY) ×2 IMPLANT
PAD OB MATERNITY 4.3X12.25 (PERSONAL CARE ITEMS) ×2 IMPLANT
PENCIL SMOKE EVAC W/HOLSTER (ELECTROSURGICAL) ×2 IMPLANT
RTRCTR C-SECT PINK 25CM LRG (MISCELLANEOUS) ×2 IMPLANT
STRIP CLOSURE SKIN 1/2X4 (GAUZE/BANDAGES/DRESSINGS) ×2 IMPLANT
SUT MNCRL 0 VIOLET CTX 36 (SUTURE) ×2 IMPLANT
SUT MNCRL AB 3-0 PS2 27 (SUTURE) ×2 IMPLANT
SUT MONOCRYL 0 CTX 36 (SUTURE) ×2
SUT PDS AB 0 CTX 60 (SUTURE) ×4 IMPLANT
SUT PLAIN 0 NONE (SUTURE) IMPLANT
SUT PLAIN 2 0 (SUTURE) ×1
SUT PLAIN ABS 2-0 CT1 27XMFL (SUTURE) ×1 IMPLANT
SUT VIC AB 0 CTX 36 (SUTURE) ×2
SUT VIC AB 0 CTX36XBRD ANBCTRL (SUTURE) ×2 IMPLANT
SUT VIC AB 2-0 CT1 27 (SUTURE) ×1
SUT VIC AB 2-0 CT1 TAPERPNT 27 (SUTURE) ×1 IMPLANT
TOWEL OR 17X24 6PK STRL BLUE (TOWEL DISPOSABLE) ×2 IMPLANT
TRAY FOLEY BAG SILVER LF 14FR (SET/KITS/TRAYS/PACK) ×2 IMPLANT

## 2016-11-06 NOTE — Lactation Note (Signed)
This note was copied from a baby's chart. Lactation Consultation Note  Patient Name: Kimberly Diaz [id number]'XToday's Date: 11/06/2016 Reason for consult: Initial assessment    with this first time mom of a term baby, now 447 hours old. Mom has full, soft breasts, evert nipples that are small and short. The baby is having trouble staying latched, and mom feels pinching with latch. 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 After a few attempts at latching, mom agreed to trying  nipple shield. The baby was intermittently trying to suckle, but when latched to the shield, he just slept.  Mom was shown how to hand express, and she demonstrated very good technique. Mom was able to express at least 3 ml's , which was spoon fed to the baby. Dad helped mom with hand expressing and spoon feeding. Breast milk storage reviewed. Lactation information left with mom. Basic breast feeding teaching done while working with mom.  I set up a DEP, and had mom begin pumping in initiation setting, followed by hand expression.  Mom knows to call for questions/concerns, e.g.. Help with latching.    Maternal Data    Feeding Feeding Type: Breast Milk  LATCH Score/Interventions Latch: Too sleepy or reluctant, no latch achieved, no sucking elicited. Intervention(s): Skin to skin;Teach feeding cues;Waking techniques Intervention(s): Adjust position;Assist with latch;Breast compression  Audible Swallowing: None (mom taught HE and we spoon fed baby 3 ml'sof colostrum) Intervention(s): Skin to skin;Hand expression  Type of Nipple: Everted at rest and after stimulation (everted, small, soft, breast tissue, baby not able to maintain latch, cleft noted in tip of her tongue) Intervention(s): Double electric pump  Comfort (Breast/Nipple): Soft / non-tender (nipples feel compressed with latach, baby with cleft in tip of tongue)     Hold (Positioning): Assistance needed to correctly position infant at breast and maintain  latch. Intervention(s): Breastfeeding basics reviewed;Support Pillows;Position options;Skin to skin  LATCH Score: 5  Lactation Tools Discussed/Used Tools: Nipple Shields Nipple shield size: 20 WIC Program: No Pump Review: Setup, frequency, and cleaning;Milk Storage;Other (comment) Initiated by:: [name], [name] Date initiated:: 11/06/16   Consult Status Consult Status: Follow-up Date: 11/07/16 Follow-up type: In-patient    Alfred LevinsLee, Amberrose Friebel Anne 11/06/2016, 12:23 PM

## 2016-11-06 NOTE — Brief Op Note (Signed)
11/05/2016 - 11/06/2016  3:35 AM  PATIENT:  Theodoro KosJessica Jaskot  26 y.o. female  PRE-OPERATIVE DIAGNOSIS:  primary c/s, arrest of dilation, type 2 diabetes  POST-OPERATIVE DIAGNOSIS:  primary c/s, arrest of dilation, type 2 diabetes  PROCEDURE:  Procedure(s): CESAREAN SECTION (N/A)  SURGEON:  Surgeon(s) and Role:    * Marlow Baarsyanna Jeffery Bachmeier, MD - Primary  ANESTHESIA:   epidural  EBL:  Total I/O In: 1000 [I.V.:1000] Out: 1250 [Urine:450; Blood:800]  BLOOD ADMINISTERED:none  DRAINS: none   LOCAL MEDICATIONS USED:  NONE  SPECIMEN:  No Specimen  DISPOSITION OF SPECIMEN:  N/A  COUNTS:  YES  TOURNIQUET:  * No tourniquets in log *  DICTATION: .Note written in EPIC  PLAN OF CARE: Admit to inpatient   PATIENT DISPOSITION:  PACU - hemodynamically stable.   Delay start of Pharmacological VTE agent (>24hrs) due to surgical blood loss or risk of bleeding: not applicable

## 2016-11-06 NOTE — Progress Notes (Signed)
Pt blood sugar was 142 before dinner; states that she had already started eating a little bit before sugar was checked; states she has pizza coming for dinner; educated on carb modified diet because of diabetes; will recheck sugar 2 hours after meal

## 2016-11-06 NOTE — Anesthesia Postprocedure Evaluation (Addendum)
Anesthesia Post Note  Patient: Kimberly Diaz  Procedure(s) Performed: Procedure(s) (LRB): CESAREAN SECTION (N/A)  Patient location during evaluation: Mother Baby Anesthesia Type: Epidural Level of consciousness: awake and alert and oriented Pain management: pain level controlled Vital Signs Assessment: post-procedure vital signs reviewed and stable Respiratory status: spontaneous breathing and nonlabored ventilation Cardiovascular status: stable Postop Assessment: no headache, no backache, patient able to bend at knees, no signs of nausea or vomiting and adequate PO intake Anesthetic complications: no        Last Vitals:  Vitals:   11/06/16 0800 11/06/16 1000  BP: 120/66 115/66  Pulse: 70 81  Resp: 20 18  Temp: 37.1 C 36.9 C    Last Pain:  Vitals:   11/06/16 1210  TempSrc:   PainSc: 0-No pain   Pain Goal:                 GREGORY,SUZANNE

## 2016-11-06 NOTE — Progress Notes (Signed)
CBG (last 3)   Recent Labs  11/06/16 0224 11/06/16 0455 11/06/16 0742  GLUCAP [phone]

## 2016-11-06 NOTE — Progress Notes (Signed)
Patient rechecked and unchanged at 9/100/-1.  IUPC was placed two hours ago and MVUs more than adequate at 240.  Patient is a type 2 DM, requiring insulin, EFW 8-8.5lb, and still at -1 station, now arrested at 9 cm x 6 hours, I recommend proceeding to the OR for arrest of dilation at this time.  Patient agrees.  Discussed risks to include infection, bleeding, damage to surrounding structures (including but not limited to bowel, bladder, tubes, ovaries, nerves, vessels, baby), need for additional procedures, vte, risk of blood transfusion.  Consent signed.  Hgb 11.5, blood glucose 89

## 2016-11-06 NOTE — Op Note (Addendum)
Cesarean Section Procedure Note PRE-OPERATIVE DIAGNOSIS:  primary c/s, arrest of dilation, type 2 diabetes  POST-OPERATIVE DIAGNOSIS:  primary c/s, arrest of dilation, type 2 diabetes  PROCEDURE:  Procedure(s): CESAREAN SECTION (N/A)  SURGEON:  Surgeon(s) and Role:    * Marlow Baarsyanna Jaleen Finch, MD - Primary  ANESTHESIA:   epidural  EBL:  Total I/O In: 1000 [I.V.:1000] Out: 1250 [Urine:450; Blood:800]  BLOOD ADMINISTERED:none  DRAINS: none   LOCAL MEDICATIONS USED:  NONE  SPECIMEN:  No Specimen         Complications:  None; patient tolerated the procedure well.         Disposition: PACU - hemodynamically stable.  Findings:  Normal uterus, tubes and ovaries bilaterally.  Viable female infant, weight pending Apgars 6, 9.    Procedure Details   After epidural anesthesia was found to adequate , the patient was placed in the dorsal supine position with a leftward tilt, draped and prepped in the usual sterile manner. A Pfannenstiel incision was made and carried down through the subcutaneous tissue to the fascia. The fascia was incised in the midline and the fascial incision was extended laterally with Mayo scissors. The superior aspect of the fascial incision was grasped with two Kocher clamp, tented up and the rectus muscles dissected off sharply. The rectus was then dissected off with blunt dissection and Mayo scissors inferiorly. The rectus muscles were separated in the midline. The abdominal peritoneum was identified, tented up, entered sharply, and the incision was extended superiorly and inferiorly with good visualization of the bladder. The Alexis retractor was deployed. The vesicouterine peritoneum was identified and the bladder was noted to be low. A scalpel was then used to make a low transverse incision on the uterus which was extended in the cephalad-caudad direction with blunt dissection. The fluid was clear. The fetal vertex was identified, elevated out of the pelvis and  brought to the hysterotomy. The head was delivered easily followed by the shoulders and body. After a sixty second delayed cord clamping per neonatology protocol, the cord was clamped and cut and the infant was passed to the waiting neonatologist. Placenta was then delivered spontaneously, intact and appear normal, the uterus was cleared of all clot and debris   The hysterotomy was repaired with #0 Monocryl in running locked fashion.  A second imbricating layer was placed.     The hysterotomy was reexamined and excellent hemostasis was noted.  The Alexis retractor was removed from the abdomen. The peritoneum was examined and all vessels noted to be hemostatic. The abdominal cavity was cleared of all clot and debris.  The peritoneum was closed with 2-0 vicryl in a running fashion. The fascia and rectus muscles were inspected and were hemostatic. The fascia was closed with 0 PDS in a running fashion. The subcuticular layer was irrigated and all bleeders cauterized.  The subcutaneous layer was re approximated with interrupted 3-0 plain gut.  The skin was closed with 3-0 monocryl in a subcuticular fashion. The incision was dressed with benzoine, steri strips and pressure dressing. All sponge lap and needle counts were correct x3. Patient tolerated the procedure well and recovered in stable condition following the procedure.

## 2016-11-06 NOTE — Addendum Note (Signed)
Addendum  created 11/06/16 1333 by Shanon PayorSuzanne M Elisea Khader, CRNA   Sign clinical note

## 2016-11-06 NOTE — Progress Notes (Signed)
Patient seen and examined.  Was called 9.5/100/-1 at 2000.   On my exam, she is 9/100/-1.  OA position  Toco: q2-3 min EFM: 120s, mod var, + accels IUPC placed    A&P: g1 @ 2371w2d w IOL for DM2 BS have all been at goal Protracted active phase.  IUPC placed. Will re-evaluate in 1 hour to determine if contractions are adequate FSR

## 2016-11-06 NOTE — Transfer of Care (Signed)
Immediate Anesthesia Transfer of Care Note  Patient: Kimberly KosJessica Diaz  Procedure(s) Performed: Procedure(s): CESAREAN SECTION (N/A)  Patient Location: PACU  Anesthesia Type:Epidural  Level of Consciousness: awake, alert  and oriented  Airway & Oxygen Therapy: Patient Spontanous Breathing  Post-op Assessment: Report given to RN and Post -op Vital signs reviewed and stable  Post vital signs: Reviewed and stable  Last Vitals:  Vitals:   11/06/16 0201 11/06/16 0222  BP: (!) 138/108 113/67  Pulse: (!) 101 84  Resp:    Temp:      Last Pain:  Vitals:   11/06/16 0201  TempSrc:   PainSc: 0-No pain         Complications: No apparent anesthesia complications

## 2016-11-06 NOTE — Lactation Note (Signed)
This note was copied from a baby's chart. Mom noted to have significant edema in nipples bilaterally.  Baby was unable to achieve and hold deep latch.  Baby able to feed x 2 with teacup hold, but with repeated latches, unable to sustain latch for very long.  Mom assisted with putting on bra, inverted nipple shells placed.  Hand pump used to pre-pump. Able to hand express approx. of colostrum & fed to baby post-feed.  Mom noted to have linear stripe above nipple on areola.

## 2016-11-06 NOTE — Progress Notes (Signed)
RN was called to patient's room @ 1415 because pt didn't feel well after waking up from a nap (slept for an hour per family).  Felt like her BP was low, though she states she has never had low BP or low blood sugar.  BP is 115/65.  O2 sats dropped to 88% so RN had pt use her IS, then they came up to 95%.  Per H+P, pt has type 2 DM.  CBG was checked and is 67.  Pt has not eaten for about 4 hours and her lunch is available.  Encouraged pt to eat.  During the time the RN was assessing the pt, she started feeling better and looking more alert.  Delice BisonBettie Terrick Allred RCharity fundraiser

## 2016-11-06 NOTE — Progress Notes (Signed)
DM Coordinator called- believes pt does NOTneed insulin pp- sugars are currently very low.   Agree- will stop all meds for now and add back metformin if sugars increase.

## 2016-11-06 NOTE — Consult Note (Signed)
Neonatology Note:   Attendance at C-section:    I was asked by Dr. Chestine Sporelark to attend this primary C/S at term due to arrest of descent. The mother is a G1P0 B pos, GBS neg, and Rubella NI with Type 2 DM, on Metformin prior to pregnancy, now on insulin with good control during pregnancy. She also has PCOS and anxiety, on Celexa. ROM 14 hours prior to delivery, fluid bloody. Infant had good tone but a weak intermittent cry at birth; given stimulation and bulb suctioning during delayed cord clamping. Needed only minimal bulb suctioning, but the baby had marked periodic breathing and required stimulation several times in the first 1-3 minutes, with increasingly regular breathing noted. She was still dusky at 3-4 minutes, so we placed a pulse oximeter, O2 sat in 60s, BBO2 started, with good response. Lungs with coarse breath sounds, so chest PT performed and DeLee suctioning for retrieval of about 5 ml clear mucous. Gradually removed BBO2 and observed her for 3-4 more minutes, maintaining O2 sats in the 90s, no distress seen. Ap 6/9. Lungs clear to ausc in DR after above interventions. To CN to care of Pediatrician.   Doretha Souhristie C. Rainn Zupko, MD

## 2016-11-06 NOTE — Progress Notes (Signed)
Patient ID: [name], female   DOB: 1990/10/19, 26 y.o.   MRN: [id number]    Patient is eating, ambulating, voiding.  Pain control is good.  Vitals:   11/06/16 0504 11/06/16 0505 11/06/16 0517 11/06/16 0640  BP:   129/70 (!) 102/57  Pulse: 78 86 75 69  Resp: 16 (!) 22  18  Temp:   99.5 F (37.5 C) 98.6 F (37 C)  TempSrc:   Oral Oral  SpO2: 99% 98% 93% 94%  Weight:      Height:        lungs:   clear to auscultation cor:    RRR Abdomen:  soft, appropriate tenderness, incisions intact and without erythema or exudate ex:    no cords   Lab Results  Component Value Date   WBC 11.7 (H) 11/05/2016   HGB 11.5 (L) 11/05/2016   HCT 34.5 (L) 11/05/2016   MCV 82.3 11/05/2016   PLT 298 11/05/2016   CBG (last 3)   Recent Labs  11/06/16 0002 11/06/16 0224 11/06/16 0742  GLUCAP 89 77 67       --/--/B POS, B POS (05/03 0812)/RI  A/P    Post operative day 1. Type 2 DM- on half of her predelivery insulin.  Follow sugars and adjust as necessary.  Routine post op and postpartum care.  Expect d/c tomorrow.  Percocet for pain control.

## 2016-11-06 NOTE — Progress Notes (Signed)
MOB was referred for history of depression/anxiety. * Referral screened out by Clinical Social Worker because none of the following criteria appear to apply: ~ History of anxiety/depression during this pregnancy, or of post-partum depression. ~ Diagnosis of anxiety and/or depression within last 3 years OR * MOB's symptoms currently being treated with medication and/or therapy. Please contact the Clinical Social Worker if needs arise, or if MOB requests.  MOB has Rx for Celexa.

## 2016-11-06 NOTE — Progress Notes (Signed)
Spoke with patient and verified was patient only taking Metformin prior to pregnancy. Spoke with  Dr. Henderson CloudHorvath by phone and plans to D/C current insulin orders and continue to monitor CBGs. Will follow.  Thank you, Billy FischerJudy E. Laaibah Wartman, RN, MSN, CDE  Diabetes Coordinator Inpatient Glycemic Control Team Team Pager [phone] (8am-5pm) 11/06/2016 8:15 AM

## 2016-11-06 NOTE — Anesthesia Postprocedure Evaluation (Signed)
Anesthesia Post Note  Patient: Kimberly Diaz  Procedure(s) Performed: Procedure(s) (LRB): CESAREAN SECTION (N/A)  Patient location during evaluation: Mother Baby Anesthesia Type: Epidural Level of consciousness: awake Pain management: pain level controlled Vital Signs Assessment: post-procedure vital signs reviewed and stable Respiratory status: spontaneous breathing Cardiovascular status: stable Postop Assessment: no signs of nausea or vomiting, no headache, no backache, epidural receding and patient able to bend at knees Anesthetic complications: no        Last Vitals:  Vitals:   11/06/16 0517 11/06/16 0640  BP: 129/70 (!) 102/57  Pulse: 75 69  Resp:  18  Temp: 37.5 C     Last Pain:  Vitals:   11/06/16 0530  TempSrc:   PainSc: 3    Pain Goal:                 Kimberly Diaz,Kimberly Diaz

## 2016-11-07 LAB — GLUCOSE, CAPILLARY
Glucose-Capillary: 68 mg/dL (ref 65–99)
Glucose-Capillary: 80 mg/dL (ref 65–99)
Glucose-Capillary: 86 mg/dL (ref 65–99)

## 2016-11-07 LAB — CBC
HCT: 28 % — ABNORMAL LOW (ref 36.0–46.0)
Hemoglobin: 9.4 g/dL — ABNORMAL LOW (ref 12.0–15.0)
MCH: 28 pg (ref 26.0–34.0)
MCHC: 33.6 g/dL (ref 30.0–36.0)
MCV: 83.3 fL (ref 78.0–100.0)
Platelets: 234 10*3/uL (ref 150–400)
RBC: 3.36 MIL/uL — ABNORMAL LOW (ref 3.87–5.11)
RDW: 15.5 % (ref 11.5–15.5)
WBC: 13 10*3/uL — ABNORMAL HIGH (ref 4.0–10.5)

## 2016-11-07 NOTE — Progress Notes (Signed)
Patient is eating, ambulating, voiding.  Pain control is good.  Vitals:   11/06/16 1715 11/06/16 2210 11/07/16 0200 11/07/16 0603  BP:  (!) 123/57 112/67 112/71  Pulse: 82 75 64 64  Resp: 18 18 18 16   Temp: 98.7 F (37.1 C) 98 F (36.7 C) 97.7 F (36.5 C) 98 F (36.7 C)  TempSrc: Oral Axillary Oral Oral  SpO2: 94% 98% 97%   Weight:      Height:        Fundus firm Perineum without swelling.  Lab Results  Component Value Date   WBC 13.0 (H) 11/07/2016   HGB 9.4 (L) 11/07/2016   HCT 28.0 (L) 11/07/2016   MCV 83.3 11/07/2016   PLT 234 11/07/2016   CBG (last 3)   Recent Labs  11/06/16 2020 11/06/16 2334 11/07/16 0559  GLUCAP 142* 133* 68      --/--/B POS, B POS (05/03 0812)/RI  A/P Post partum day 2.  Routine care.  Expect d/c today.  Suagrs are excellent- will have pt track at home and RTC in 2 weeks for eval of restarting PO meds.   Annalucia Laino A

## 2016-11-07 NOTE — Lactation Note (Signed)
This note was copied from a baby's chart. Lactation Consultation Note  Patient Name: Kimberly Theodoro KosJessica Holstad ZOXWR'UToday's Date: 11/07/2016 Reason for consult: Follow-up assessment Infant is 3436 hours old & seen by Lactation for follow-up assessment. Mom was waking [facility]. Last feeding was ~2 hrs before. Mom had baby skin-to-skin and baby started rooting. Suggested mom try laid-back breastfeeding so mom did on right breast. First tried without the nipple shield but baby suckled on mom's areola and would not latch to mom's nipple (mom had bruising on her areola and this could be one reason). Once nipple shield was applied, after a few attempts, baby latched and suckled. Baby needed frequent stimulation to continue suckling. A few swallows were noted but there was only trace amounts of breastmilk in the nipple shield after baby unlatched. Baby fed for ~10 mins and then fell asleep. FOB burped baby and placed her skin-to-skin with mom after baby woke up again. Mom applied nipple shield to right breast and after several attempts, baby did latch in laid-back position but after a few suckles, baby fell asleep again. Mom did hand expression and was able to get ~322mL, which they spoon fed to baby. Mom then pumped with DEBP on Initiate setting. LC left while mom was pumping. Encouraged mom to continue BF 8-12 x in 24hrs followed by pumping and hand expression, which they will then supplement baby with the EBM. Mom reports no questions at this time. Encouraged mom to ask for help as needed.   Maternal Data    Feeding Feeding Type: Breast Milk Length of feed: 10 min  LATCH Score/Interventions Latch: Repeated attempts needed to sustain latch, nipple held in mouth throughout feeding, stimulation needed to elicit sucking reflex. Intervention(s): Skin to skin;Waking techniques Intervention(s): Adjust position;Assist with latch;Breast compression;Breast massage  Audible Swallowing: A few with  stimulation Intervention(s): Hand expression;Skin to skin Intervention(s): Alternate breast massage;Hand expression  Type of Nipple: Flat Intervention(s): Shells;Double electric pump  Comfort (Breast/Nipple): Filling, red/small blisters or bruises, mild/mod discomfort  Problem noted: Cracked, bleeding, blisters, bruises Interventions  (Cracked/bleeding/bruising/blister): Double electric pump;Expressed breast milk to nipple (brusing on areola)  Hold (Positioning): No assistance needed to correctly position infant at breast. Intervention(s): Breastfeeding basics reviewed;Support Pillows;Position options;Skin to skin  LATCH Score: 6  Lactation Tools Discussed/Used Tools: Nipple Shields Nipple shield size: 20   Consult Status Consult Status: Follow-up Date: 11/08/16 Follow-up type: In-patient    Oneal GroutLaura C Janiyah Beery 11/07/2016, 4:20 PM

## 2016-11-07 NOTE — Plan of Care (Signed)
Problem: Urinary Elimination: Goal: Ability to reestablish a normal urinary elimination pattern will improve Outcome: Progressing Has voided x1 post catheter removal; still needs 1 more

## 2016-11-08 ENCOUNTER — Encounter (HOSPITAL_COMMUNITY): Payer: Self-pay | Admitting: *Deleted

## 2016-11-08 LAB — GLUCOSE, CAPILLARY: Glucose-Capillary: 63 mg/dL — ABNORMAL LOW (ref 65–99)

## 2016-11-08 LAB — POCT CBG (FASTING - GLUCOSE)-MANUAL ENTRY: Glucose Fasting, POC: 92 mg/dL (ref 70–99)

## 2016-11-08 MED ORDER — OXYCODONE HCL 10 MG PO TABS: 10.0000 mg | ORAL_TABLET | ORAL | 0 refills | Status: DC | PRN

## 2016-11-08 NOTE — Progress Notes (Signed)
CBG did not manually transfer. CBG was 92.

## 2016-11-08 NOTE — Progress Notes (Signed)
  Patient is eating, ambulating, voiding.  Pain control is good.  Vitals:   11/07/16 0200 11/07/16 0603 11/07/16 1749 11/08/16 0546  BP: 112/67 112/71 123/82 (!) 159/78  Pulse: 64 64 76 (!) 58  Resp: 18 16 12 19   Temp: 97.7 F (36.5 C) 98 F (36.7 C) 98.6 F (37 C) 97.8 F (36.6 C)  TempSrc: Oral Oral Oral Oral  SpO2: 97%  100%   Weight:      Height:        lungs:   clear to auscultation cor:    RRR Abdomen:  soft, appropriate tenderness, incisions intact and without erythema or exudate ex:    no cords   Lab Results  Component Value Date   WBC 13.0 (H) 11/07/2016   HGB 9.4 (L) 11/07/2016   HCT 28.0 (L) 11/07/2016   MCV 83.3 11/07/2016   PLT 234 11/07/2016   CBG (last 3)   Recent Labs  11/07/16 1146 11/07/16 2046 11/08/16 0847  GLUCAP 80 86 63*     --/--/B POS, B POS (05/03 0812)/RI  A/P    Post operative day 2.  Routine post op and postpartum care.  Expect d/c today.  Percocet for pain control. Pt received once dose of metformin the night before but sugars have been great without metformin last night.  Pt to check sugars at home and call if elevated persistently above 150s.  Check with us at two weeks in office.

## 2016-11-08 NOTE — Lactation Note (Signed)
This note was copied from a baby's chart. Lactation Consultation Note Mom is gaining more confidence with breast feeding. Assisted with positioning. Optimal positioning is very important for Emerson to maintain latch.  She suckled and swallowed but suck:swallow ratio was high at 5-6:1.  Breast compression improved ratio.  Mom reported more comfort with better positioning. Encouraged her to post-pump 4-6 times in 24 hours related to NS use.  Stressed importance of breast softening. Deatra Cantermerson is occasionally spoon fed EBM. Gave parents foley cup with instructions in the event that supplementation amounts become greater. Follow-up with ped tomorrow. Informed parents that an OP appointment is important related to NS use. They may follow-up with Jimmye NormanBeth Sanders.   [facility]   Patient Name: Girl Theodoro KosJessica Lowdermilk ZOXWR'UToday's Date: 11/08/2016 Reason for consult: Follow-up assessment   Maternal Data    Feeding Feeding Type: Breast Fed Length of feed: 40 min  LATCH Score/Interventions Latch: Repeated attempts needed to sustain latch, nipple held in mouth throughout feeding, stimulation needed to elicit sucking reflex.  Audible Swallowing: A few with stimulation  Type of Nipple: Flat  Comfort (Breast/Nipple): Soft / non-tender     Hold (Positioning): Assistance needed to correctly position infant at breast and maintain latch.  LATCH Score: 6  Lactation Tools Discussed/Used Tools: Nipple Shields Nipple shield size: 20   Consult Status Consult Status: Follow-up Date: 11/09/16 Follow-up type: Physician    Soyla DryerJoseph, Tamala Manzer 11/08/2016, 10:17 AM

## 2016-11-20 NOTE — Discharge Summary (Signed)
Obstetric Discharge Summary Reason for Admission: induction of labor Prenatal Procedures: NST Intrapartum Procedures: cesarean: low cervical, transverse Postpartum Procedures: none Complications-Operative and Postpartum: none Hemoglobin  Date Value Ref Range Status  11/07/2016 9.4 (L) 12.0 - 15.0 g/dL Final   HCT  Date Value Ref Range Status  11/07/2016 28.0 (L) 36.0 - 46.0 % Final    Discharge Diagnoses: Term Pregnancy-delivered  Discharge Information: Date: 11/20/2016 Activity: pelvic rest Diet: routine Medications: Ibuprofen and Iron Condition: stable Instructions: refer to practice specific booklet Discharge to: home   Newborn Data: Live born female  Birth Weight: 7 lb 8.1 oz (3405 g) APGAR: 6, 9  Home with mother.  Porscha Axley A 11/20/2016, 1:36 AM

## 2016-12-07 DIAGNOSIS — Z6831 Body mass index (BMI) 31.0-31.9, adult: Secondary | ICD-10-CM | POA: Diagnosis not present

## 2016-12-11 NOTE — Addendum Note (Signed)
Addendum  created 12/11/16 1227 by Kamrin Spath, MD   Sign clinical note    

## 2017-01-03 LAB — HM DIABETES EYE EXAM

## 2017-03-10 ENCOUNTER — Telehealth: Payer: Self-pay | Admitting: Family Medicine

## 2017-03-10 NOTE — Telephone Encounter (Signed)
Pt states her baby has been diagnosed and being treated for thrush.  She was advised to contact her pcp to see if she needs to be treated as well since she is breast feeding.  Please advise.   Pharmacy:  [id number] [place] - [address] [phone] (Phone) [phone] (Fax)

## 2017-03-10 NOTE — Telephone Encounter (Signed)
Pt states that she is not having any irritations to the nipple are or any itching. Please advise?

## 2017-03-11 NOTE — Telephone Encounter (Signed)
Called and left a detailed message on pt phone to inform of PCP recommendations.

## 2017-03-11 NOTE — Telephone Encounter (Signed)
The literature is split as to whether mother's need to be treated.  Often babies are not treated unless the yeast is causing difficulty w/ feeding.  Since pt is asymptomatic at this time, no need for treatment

## 2017-03-15 NOTE — Telephone Encounter (Signed)
Will defer to PCP

## 2017-03-15 NOTE — Telephone Encounter (Signed)
Patient states that she now has symptoms of yeast on her right side from nursing the baby.  Skin irritation.  She is wondering of something can be called in for her.       [facility] [address]

## 2017-03-16 ENCOUNTER — Encounter: Payer: Self-pay | Admitting: Family Medicine

## 2017-03-16 ENCOUNTER — Ambulatory Visit: Payer: BC Managed Care – PPO | Admitting: Physician Assistant

## 2017-03-16 ENCOUNTER — Ambulatory Visit (INDEPENDENT_AMBULATORY_CARE_PROVIDER_SITE_OTHER): Payer: BLUE CROSS/BLUE SHIELD | Admitting: Family Medicine

## 2017-03-16 VITALS — BP 122/80 | HR 78 | Temp 98.0°F | Resp 16 | Ht 65.0 in | Wt 197.1 lb

## 2017-03-16 DIAGNOSIS — B369 Superficial mycosis, unspecified: Secondary | ICD-10-CM | POA: Diagnosis not present

## 2017-03-16 MED ORDER — CLOTRIMAZOLE-BETAMETHASONE 1-0.05 % EX CREA: TOPICAL_CREAM | CUTANEOUS | 0 refills | Status: DC

## 2017-03-16 NOTE — Telephone Encounter (Signed)
Offered 9:45 with PCP - patient states she could not get here by then.    Patient chose 11:30 with PA today.

## 2017-03-16 NOTE — Progress Notes (Signed)
Pre visit review using our clinic review tool, if applicable. No additional management support is needed unless otherwise documented below in the visit note. 

## 2017-03-16 NOTE — Telephone Encounter (Signed)
Can you see if you can schedule pt?

## 2017-03-16 NOTE — Telephone Encounter (Signed)
Skin irritation is not necessarily yeast- this is common when breastfeeding.  I would recommend an evaluation at GYN or in the office to assess

## 2017-03-16 NOTE — Patient Instructions (Signed)
Schedule a diabetes check in 2-3 weeks (we will also recheck your breasts) Apply the Lotrisone cream after feeding- apply a thin layer.  Wipe any visible medication off your nipple prior to feeding Call with any questions or concerns Hang in there!!! CONGRATS!!!

## 2017-03-16 NOTE — Progress Notes (Signed)
   Subjective:    Patient ID: Kimberly KosJessica Diaz, female    DOB: 12-22-1990, 26 y.o.   MRN: [id number]  HPI Yeast- infant daughter was diagnosed with yeast last week and pt had noticed increased irritation around her nipples.  Some discomfort w/ feeding.  Pt is also pumping in addition to breast feeding.   Review of Systems For ROS see HPI     Objective:   Physical Exam  Constitutional: She is oriented to person, place, and time. She appears well-developed and well-nourished. No distress.  HENT:  Head: Normocephalic and atraumatic.  Neurological: She is alert and oriented to person, place, and time.  Skin: Skin is warm and dry. There is erythema (R nipple is erythematous, almost violacious in color, with dry, cracking skin).  Psychiatric: She has a normal mood and affect. Her behavior is normal. Thought content normal.  Vitals reviewed.         Assessment & Plan:

## 2017-03-30 ENCOUNTER — Ambulatory Visit (INDEPENDENT_AMBULATORY_CARE_PROVIDER_SITE_OTHER): Payer: BLUE CROSS/BLUE SHIELD | Admitting: Family Medicine

## 2017-03-30 ENCOUNTER — Encounter: Payer: Self-pay | Admitting: Family Medicine

## 2017-03-30 VITALS — BP 110/78 | HR 79 | Temp 98.9°F | Resp 16 | Ht 65.0 in | Wt 197.5 lb

## 2017-03-30 DIAGNOSIS — E119 Type 2 diabetes mellitus without complications: Secondary | ICD-10-CM

## 2017-03-30 LAB — CBC WITH DIFFERENTIAL/PLATELET
Basophils Absolute: 0.1 10*3/uL (ref 0.0–0.1)
Basophils Relative: 0.8 % (ref 0.0–3.0)
Eosinophils Absolute: 0.3 10*3/uL (ref 0.0–0.7)
Eosinophils Relative: 2.8 % (ref 0.0–5.0)
HCT: 41.2 % (ref 36.0–46.0)
Hemoglobin: 13.7 g/dL (ref 12.0–15.0)
Lymphocytes Relative: 31.1 % (ref 12.0–46.0)
Lymphs Abs: 3 10*3/uL (ref 0.7–4.0)
MCHC: 33.4 g/dL (ref 30.0–36.0)
MCV: 81.1 fl (ref 78.0–100.0)
Monocytes Absolute: 0.5 10*3/uL (ref 0.1–1.0)
Monocytes Relative: 5.6 % (ref 3.0–12.0)
Neutro Abs: 5.7 10*3/uL (ref 1.4–7.7)
Neutrophils Relative %: 59.7 % (ref 43.0–77.0)
Platelets: 363 10*3/uL (ref 150.0–400.0)
RBC: 5.07 Mil/uL (ref 3.87–5.11)
RDW: 14.7 % (ref 11.5–15.5)
WBC: 9.6 10*3/uL (ref 4.0–10.5)

## 2017-03-30 LAB — LIPID PANEL
Cholesterol: 148 mg/dL (ref 0–200)
HDL: 39.9 mg/dL (ref >39.00)
LDL Cholesterol: 80 mg/dL (ref 0–99)
NonHDL: 108.07
Total CHOL/HDL Ratio: 4
Triglycerides: 140 mg/dL (ref 0.0–149.0)
VLDL: 28 mg/dL (ref 0.0–40.0)

## 2017-03-30 LAB — HEPATIC FUNCTION PANEL
ALT: 30 U/L (ref 0–35)
AST: 22 U/L (ref 0–37)
Albumin: 4.6 g/dL (ref 3.5–5.2)
Alkaline Phosphatase: 88 U/L (ref 39–117)
Bilirubin, Direct: 0.1 mg/dL (ref 0.0–0.3)
Total Bilirubin: 0.7 mg/dL (ref 0.2–1.2)
Total Protein: 7.5 g/dL (ref 6.0–8.3)

## 2017-03-30 LAB — BASIC METABOLIC PANEL
BUN: 11 mg/dL (ref 6–23)
CO2: 31 mEq/L (ref 19–32)
Calcium: 10.5 mg/dL (ref 8.4–10.5)
Chloride: 102 mEq/L (ref 96–112)
Creatinine, Ser: 0.55 mg/dL (ref 0.40–1.20)
GFR: 140.74 mL/min (ref >60.00)
Glucose, Bld: 103 mg/dL — ABNORMAL HIGH (ref 70–99)
Potassium: 4.3 mEq/L (ref 3.5–5.1)
Sodium: 139 mEq/L (ref 135–145)

## 2017-03-30 LAB — HEMOGLOBIN A1C: Hgb A1c MFr Bld: 5.7 % (ref 4.6–6.5)

## 2017-03-30 LAB — MICROALBUMIN / CREATININE URINE RATIO
Creatinine,U: 172.1 mg/dL
Microalb Creat Ratio: 1 mg/g (ref 0.0–30.0)
Microalb, Ur: 1.7 mg/dL (ref 0.0–1.9)

## 2017-03-30 LAB — TSH: TSH: 1.14 u[IU]/mL (ref 0.35–4.50)

## 2017-03-30 NOTE — Progress Notes (Signed)
   Subjective:    Patient ID: Kimberly Diaz, female    DOB: 1990/12/13, 26 y.o.   MRN: [id number]  HPI DM- chronic problem.  Pt was previously on Metformin and during pregnancy was on both short and long acting insulin, but now attempting to control w/ diet and exercise.  UTD on eye exam.  Due for foot exam and microalbumin.  Denies symptomatic lows.  Pt notes increased fatigue, feeling 'sluggish'.  Some muscle and joint aches.   Review of Systems For ROS see HPI     Objective:   Physical Exam  Constitutional: She is oriented to person, place, and time. She appears well-developed and well-nourished. No distress.  obese  HENT:  Head: Normocephalic and atraumatic.  Eyes: Pupils are equal, round, and reactive to light. Conjunctivae and EOM are normal.  Neck: Normal range of motion. Neck supple. No thyromegaly present.  Cardiovascular: Normal rate, regular rhythm, normal heart sounds and intact distal pulses.   No murmur heard. Pulmonary/Chest: Effort normal and breath sounds normal. No respiratory distress.  Abdominal: Soft. She exhibits no distension. There is no tenderness.  Musculoskeletal: She exhibits no edema.  Lymphadenopathy:    She has no cervical adenopathy.  Neurological: She is alert and oriented to person, place, and time.  Skin: Skin is warm and dry.  Psychiatric: She has a normal mood and affect. Her behavior is normal.  Vitals reviewed.         Assessment & Plan:

## 2017-03-30 NOTE — Assessment & Plan Note (Signed)
Pt has hx of DM and required insulin in 3rd trimester of recent pregnancy.  Was told that she needed to stop Metformin at time of postpartum d/c.  She is again gaining weight and feeling poorly, and feels this may be related to her sugar.  Check labs.  Restart meds prn.  Pt expressed understanding and is in agreement w/ plan.

## 2017-03-30 NOTE — Progress Notes (Signed)
Pre visit review using our clinic review tool, if applicable. No additional management support is needed unless otherwise documented below in the visit note. 

## 2017-03-30 NOTE — Patient Instructions (Signed)
Follow up as needed or as scheduled We'll notify you of your lab results and make any changes if needed Continue to work on healthy diet and regular exercise- you can do it! Please get your flu shot to protect you and baby!!! Call with any questions or concerns Hang in there!!  We'll figure this out!

## 2017-04-20 ENCOUNTER — Telehealth: Payer: Self-pay | Admitting: Family Medicine

## 2017-04-20 NOTE — Telephone Encounter (Signed)
Pt states that rash on her chest has not going away and is getting worse, pt asking if something stronger can be called in, she is concerned about this due to breast feeding, please advise

## 2017-04-21 NOTE — Telephone Encounter (Signed)
Pt will need appt to make sure we are treating this appropriately- especially in the setting of breast feeding.  If not able to get here, GYN would also be able to evaluate

## 2017-04-21 NOTE — Telephone Encounter (Signed)
Left detailed message on patient's Vm that this needs reassessment.  Let patient know that she can do that here at our office, or the GYN.   Asked that patient call to schedule appointment.

## 2017-04-21 NOTE — Telephone Encounter (Signed)
Could you call pt?

## 2017-04-21 NOTE — Telephone Encounter (Signed)
Please advise 

## 2017-05-14 ENCOUNTER — Other Ambulatory Visit: Payer: Self-pay

## 2017-05-14 ENCOUNTER — Ambulatory Visit (INDEPENDENT_AMBULATORY_CARE_PROVIDER_SITE_OTHER): Payer: BLUE CROSS/BLUE SHIELD | Admitting: Family Medicine

## 2017-05-14 ENCOUNTER — Encounter: Payer: Self-pay | Admitting: Family Medicine

## 2017-05-14 VITALS — BP 123/80 | HR 80 | Temp 98.2°F | Resp 16 | Ht 65.0 in | Wt 202.0 lb

## 2017-05-14 DIAGNOSIS — B369 Superficial mycosis, unspecified: Secondary | ICD-10-CM | POA: Diagnosis not present

## 2017-05-14 MED ORDER — FLUCONAZOLE 200 MG PO TABS: ORAL_TABLET | ORAL | 0 refills | Status: DC

## 2017-05-14 NOTE — Progress Notes (Signed)
   Subjective:    Patient ID: Kimberly Diaz, female    DOB: 12-01-1990, 26 y.o.   MRN: [id number]  HPI Breast rash- pt has been treating recurrent rash with topical antifungal and rash will improve but once she stops medication, rash will return.  Denies nipple pain but has round, flaky patches on breasts bilaterally.  Daughter w/ thrush.  Started as '1 or 2 spots in the cleavage area'.  Now washing bras regularly, changing breast pads.  Has sanitized pumping supplies.     Review of Systems For ROS see HPI     Objective:   Physical Exam  Constitutional: She is oriented to person, place, and time. She appears well-developed and well-nourished. No distress.  HENT:  Head: Normocephalic and atraumatic.  Neurological: She is alert and oriented to person, place, and time.  Skin: Skin is warm and dry. Rash (well circumscribed erythematous macules w/ central flaking on breasts bilaterally consistent w/ fungal rash) noted. There is erythema.  Vitals reviewed.         Assessment & Plan:  Fungal dermatitis- recurrent issue for pt.  Sxs improve w/ topical Lotrisone but recur when she stops treating.  Daughter continues to have thrush.  Start oral diflucan.  Reviewed supportive care and red flags that should prompt return.  Pt expressed understanding and is in agreement w/ plan.

## 2017-05-14 NOTE — Patient Instructions (Signed)
Follow up by phone or MyChart if no improvement in 2 weeks and we'll send you to derm Start the Diflucan as directed- take w/ food Continue the topical cream on the affected areas Call with any questions or concerns Hang in there!!!

## 2017-05-20 ENCOUNTER — Other Ambulatory Visit: Payer: Self-pay | Admitting: Family Medicine

## 2017-06-01 ENCOUNTER — Telehealth: Payer: Self-pay | Admitting: *Deleted

## 2017-06-01 DIAGNOSIS — R21 Rash and other nonspecific skin eruption: Secondary | ICD-10-CM

## 2017-06-01 NOTE — Telephone Encounter (Signed)
Patient states when she runs out of medication, rash comes right back.   She is asking for a referral to dermatology.  Okay to place referral?   Copied from CRM [phone]. Topic: Inquiry >> Jun 01, 2017 10:06 AM Cipriano BunkerLambe, Annette S wrote: Reason for CRM:  patient called said symptoms got a little better until ran out of medication and then rash came back.  Asking for referral for Derm.

## 2017-06-01 NOTE — Telephone Encounter (Signed)
Referral has been placed. Patient is aware.  

## 2017-06-01 NOTE — Telephone Encounter (Signed)
Ok to refer to derm for rash

## 2017-06-03 DIAGNOSIS — B354 Tinea corporis: Secondary | ICD-10-CM | POA: Diagnosis not present

## 2017-06-09 ENCOUNTER — Ambulatory Visit (INDEPENDENT_AMBULATORY_CARE_PROVIDER_SITE_OTHER): Payer: BLUE CROSS/BLUE SHIELD | Admitting: Family Medicine

## 2017-06-09 ENCOUNTER — Other Ambulatory Visit: Payer: Self-pay

## 2017-06-09 ENCOUNTER — Encounter: Payer: Self-pay | Admitting: Family Medicine

## 2017-06-09 VITALS — BP 121/81 | HR 81 | Temp 98.1°F | Resp 16 | Ht 65.0 in | Wt 200.0 lb

## 2017-06-09 DIAGNOSIS — E119 Type 2 diabetes mellitus without complications: Secondary | ICD-10-CM | POA: Diagnosis not present

## 2017-06-09 DIAGNOSIS — Z0000 Encounter for general adult medical examination without abnormal findings: Secondary | ICD-10-CM | POA: Diagnosis not present

## 2017-06-09 LAB — LIPID PANEL
Cholesterol: 189 mg/dL (ref 0–200)
HDL: 49.1 mg/dL (ref >39.00)
LDL Cholesterol: 105 mg/dL — ABNORMAL HIGH (ref 0–99)
NonHDL: 139.88
Total CHOL/HDL Ratio: 4
Triglycerides: 176 mg/dL — ABNORMAL HIGH (ref 0.0–149.0)
VLDL: 35.2 mg/dL (ref 0.0–40.0)

## 2017-06-09 LAB — HEPATIC FUNCTION PANEL
ALT: 51 U/L — ABNORMAL HIGH (ref 0–35)
AST: 34 U/L (ref 0–37)
Albumin: 5 g/dL (ref 3.5–5.2)
Alkaline Phosphatase: 88 U/L (ref 39–117)
Bilirubin, Direct: 0.1 mg/dL (ref 0.0–0.3)
Total Bilirubin: 0.6 mg/dL (ref 0.2–1.2)
Total Protein: 8.1 g/dL (ref 6.0–8.3)

## 2017-06-09 LAB — CBC WITH DIFFERENTIAL/PLATELET
Basophils Absolute: 0.1 10*3/uL (ref 0.0–0.1)
Basophils Relative: 0.6 % (ref 0.0–3.0)
Eosinophils Absolute: 0.3 10*3/uL (ref 0.0–0.7)
Eosinophils Relative: 2.5 % (ref 0.0–5.0)
HCT: 42.4 % (ref 36.0–46.0)
Hemoglobin: 14.5 g/dL (ref 12.0–15.0)
Lymphocytes Relative: 36.4 % (ref 12.0–46.0)
Lymphs Abs: 3.8 10*3/uL (ref 0.7–4.0)
MCHC: 34.2 g/dL (ref 30.0–36.0)
MCV: 82.7 fl (ref 78.0–100.0)
Monocytes Absolute: 0.6 10*3/uL (ref 0.1–1.0)
Monocytes Relative: 5.7 % (ref 3.0–12.0)
Neutro Abs: 5.7 10*3/uL (ref 1.4–7.7)
Neutrophils Relative %: 54.8 % (ref 43.0–77.0)
Platelets: 366 10*3/uL (ref 150.0–400.0)
RBC: 5.12 Mil/uL — ABNORMAL HIGH (ref 3.87–5.11)
RDW: 13.5 % (ref 11.5–15.5)
WBC: 10.4 10*3/uL (ref 4.0–10.5)

## 2017-06-09 LAB — BASIC METABOLIC PANEL
BUN: 12 mg/dL (ref 6–23)
CO2: 29 mEq/L (ref 19–32)
Calcium: 10 mg/dL (ref 8.4–10.5)
Chloride: 101 mEq/L (ref 96–112)
Creatinine, Ser: 0.56 mg/dL (ref 0.40–1.20)
GFR: 137.64 mL/min (ref >60.00)
Glucose, Bld: 82 mg/dL (ref 70–99)
Potassium: 4.2 mEq/L (ref 3.5–5.1)
Sodium: 138 mEq/L (ref 135–145)

## 2017-06-09 LAB — HEMOGLOBIN A1C: Hgb A1c MFr Bld: 5.8 % (ref 4.6–6.5)

## 2017-06-09 LAB — TSH: TSH: 2.37 u[IU]/mL (ref 0.35–4.50)

## 2017-06-09 NOTE — Patient Instructions (Signed)
Follow up in 6 months to recheck weight loss progress and sugar We'll notify you of your lab results and make any changes if needed Continue to work on healthy diet and regular exercise- you can do it! Don't forget to get your flu shot! Call with any questions or concerns Happy Holidays!!!

## 2017-06-09 NOTE — Assessment & Plan Note (Signed)
Pt's PE WNL w/ exception of obesity.  UTD on Tdap.  Due for flu- [facility].  UTD on GYN.  Check labs.  Anticipatory guidance provided.

## 2017-06-09 NOTE — Assessment & Plan Note (Signed)
Chronic problem.  Pt's sugars have been well controlled since delivery.  Pt is working on [employer] Grouphealthy diet and regular exercise.  UTD on eye exam, foot exam, and microalbumin.  Check labs and adjust tx plan prn.

## 2017-06-09 NOTE — Progress Notes (Signed)
   Subjective:    Patient ID: Kimberly Diaz, female    DOB: 17-Jun-1991, 26 y.o.   MRN: [id number]  HPI CPE- UTD on GYN, Tdap, due for flu (will get w/ husband at [facility]).     Review of Systems Patient reports no vision/ hearing changes, adenopathy,fever, weight change,  persistant/recurrent hoarseness , swallowing issues, chest pain, palpitations, edema, persistant/recurrent cough, hemoptysis, dyspnea (rest/exertional/paroxysmal nocturnal), gastrointestinal bleeding (melena, rectal bleeding), abdominal pain, significant heartburn, bowel changes, GU symptoms (dysuria, hematuria, incontinence), Gyn symptoms (abnormal  bleeding, pain),  syncope, focal weakness, memory loss, numbness & tingling, hair/nail changes, abnormal bruising or bleeding, anxiety, or depression.   + ongoing rash on breasts due to breastfeeding.  Seeing derm.    Objective:   Physical Exam General Appearance:    Alert, cooperative, no distress, appears stated age  Head:    Normocephalic, without obvious abnormality, atraumatic  Eyes:    PERRL, conjunctiva/corneas clear, EOM's intact, fundi    benign, both eyes  Ears:    Normal TM's and external ear canals, both ears  Nose:   Nares normal, septum midline, mucosa normal, no drainage    or sinus tenderness  Throat:   Lips, mucosa, and tongue normal; teeth and gums normal  Neck:   Supple, symmetrical, trachea midline, no adenopathy;    Thyroid: no enlargement/tenderness/nodules  Back:     Symmetric, no curvature, ROM normal, no CVA tenderness  Lungs:     Clear to auscultation bilaterally, respirations unlabored  Chest Wall:    No tenderness or deformity   Heart:    Regular rate and rhythm, S1 and S2 normal, no murmur, rub   or gallop  Breast Exam:    Deferred to GYN  Abdomen:     Soft, non-tender, bowel sounds active all four quadrants,    no masses, no organomegaly  Genitalia:    Deferred to GYN  Rectal:    Extremities:   Extremities normal, atraumatic, no cyanosis or  edema  Pulses:   2+ and symmetric all extremities  Skin:   Skin color, texture, turgor normal, no rashes or lesions  Lymph nodes:   Cervical, supraclavicular, and axillary nodes normal  Neurologic:   CNII-XII intact, normal strength, sensation and reflexes    throughout          Assessment & Plan:

## 2017-06-10 ENCOUNTER — Encounter: Payer: Self-pay | Admitting: General Practice

## 2017-09-01 ENCOUNTER — Telehealth: Payer: Self-pay | Admitting: Family Medicine

## 2017-09-01 NOTE — Telephone Encounter (Signed)
Copied from CRM [id number]. Topic: Quick Communication - See Telephone Encounter >> Sep 01, 2017 12:35 PM Lelon FrohlichGolden, Tashia, RMA wrote: CRM for notification. See Telephone encounter for:   09/01/17. Pt would like a call to discuss a billing and coding issues for visit on 06/09/17 please call pt at [phone] Pt stated that she had already spoken to billing and needs someone in the office to change the codes

## 2017-09-15 ENCOUNTER — Other Ambulatory Visit: Payer: Self-pay | Admitting: General Practice

## 2017-09-15 MED ORDER — CITALOPRAM HYDROBROMIDE 40 MG PO TABS: 40.0000 mg | ORAL_TABLET | Freq: Every day | ORAL | 0 refills | Status: DC

## 2017-11-27 ENCOUNTER — Other Ambulatory Visit: Payer: Self-pay | Admitting: Family Medicine

## 2017-12-06 ENCOUNTER — Ambulatory Visit (INDEPENDENT_AMBULATORY_CARE_PROVIDER_SITE_OTHER): Payer: BLUE CROSS/BLUE SHIELD | Admitting: Family Medicine

## 2017-12-06 ENCOUNTER — Other Ambulatory Visit: Payer: Self-pay

## 2017-12-06 ENCOUNTER — Encounter: Payer: Self-pay | Admitting: Family Medicine

## 2017-12-06 VITALS — BP 118/80 | HR 81 | Temp 98.1°F | Resp 16 | Ht 65.0 in | Wt 210.5 lb

## 2017-12-06 DIAGNOSIS — E119 Type 2 diabetes mellitus without complications: Secondary | ICD-10-CM

## 2017-12-06 DIAGNOSIS — F411 Generalized anxiety disorder: Secondary | ICD-10-CM | POA: Diagnosis not present

## 2017-12-06 DIAGNOSIS — E669 Obesity, unspecified: Secondary | ICD-10-CM

## 2017-12-06 LAB — BASIC METABOLIC PANEL
BUN: 8 mg/dL (ref 6–23)
CO2: 30 mEq/L (ref 19–32)
Calcium: 9.3 mg/dL (ref 8.4–10.5)
Chloride: 100 mEq/L (ref 96–112)
Creatinine, Ser: 0.55 mg/dL (ref 0.40–1.20)
GFR: 140.03 mL/min (ref >60.00)
Glucose, Bld: 206 mg/dL — ABNORMAL HIGH (ref 70–99)
Potassium: 4.2 mEq/L (ref 3.5–5.1)
Sodium: 136 mEq/L (ref 135–145)

## 2017-12-06 LAB — HEMOGLOBIN A1C: Hgb A1c MFr Bld: 7.5 % — ABNORMAL HIGH (ref 4.6–6.5)

## 2017-12-06 MED ORDER — CITALOPRAM HYDROBROMIDE 20 MG PO TABS: 20.0000 mg | ORAL_TABLET | Freq: Every day | ORAL | 3 refills | Status: DC

## 2017-12-06 NOTE — Assessment & Plan Note (Signed)
Pt was previously on Metformin but was able to stop when pregnant and last A1C was excellent.  Applauded her efforts at healthy diet and medication.  UTD on foot exam, eye exam, microalbumin.  Check labs.  Adjust tx prn.

## 2017-12-06 NOTE — Progress Notes (Signed)
   Subjective:    Patient ID: Kimberly Diaz, female    DOB: [birth year], 27 y.o.   MRN: [id number]  HPI DM- chronic problem.  Currently diet controlled.  No CP, SOB, HAs, visual changes, edema, numbness/tingling of hands feet.  No abd pain, N/V.  Obesity- pt is up 11 lbs since last visit.  [facility] and is walking ~2 miles 2-3x/week.  Pt feels she is eating well- knows she needs to eliminate sugary drinks.  Anxiety- ongoing issue.  She is interested in decreasing Citalopram.  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   Review of Systems For ROS see HPI     Objective:   Physical Exam  Constitutional: She is oriented to person, place, and time. She appears well-developed and well-nourished. No distress.  HENT:  Head: Normocephalic and atraumatic.  Eyes: Pupils are equal, round, and reactive to light. Conjunctivae and EOM are normal.  Neck: Normal range of motion. Neck supple. No thyromegaly present.  Cardiovascular: Normal rate, regular rhythm, normal heart sounds and intact distal pulses.  No murmur heard. Pulmonary/Chest: Effort normal and breath sounds normal. No respiratory distress.  Abdominal: Soft. She exhibits no distension. There is no tenderness.  Musculoskeletal: She exhibits no edema.  Lymphadenopathy:    She has no cervical adenopathy.  Neurological: She is alert and oriented to person, place, and time.  Skin: Skin is warm and dry.  Psychiatric: She has a normal mood and affect. Her behavior is normal.  Vitals reviewed.         Assessment & Plan:

## 2017-12-06 NOTE — Patient Instructions (Signed)
Schedule your complete physical in 6 months We'll notify you of your lab results and make any changes if needed Continue to work on healthy diet and regular exercise- you're doing great! DECREASE to 1/2 tab of Citalopram daily Call with any questions or concerns Have a great summer!!

## 2017-12-06 NOTE — Assessment & Plan Note (Signed)
Improved since last visit.  Pt is interested in weaning off Celexa.  Decrease to 20mg  daily.  Will follow.

## 2017-12-06 NOTE — Assessment & Plan Note (Signed)
Ongoing issue.  Pt has gained 11 lbs since last visit.  Encouraged healthy diet and regular exercise.  Will continue to follow.

## 2017-12-07 ENCOUNTER — Other Ambulatory Visit: Payer: Self-pay | Admitting: Emergency Medicine

## 2017-12-07 MED ORDER — METFORMIN HCL ER 500 MG PO TB24: 500.0000 mg | ORAL_TABLET | Freq: Two times a day (BID) | ORAL | 0 refills | Status: DC

## 2017-12-28 DIAGNOSIS — Z01419 Encounter for gynecological examination (general) (routine) without abnormal findings: Secondary | ICD-10-CM | POA: Diagnosis not present

## 2017-12-28 DIAGNOSIS — Z124 Encounter for screening for malignant neoplasm of cervix: Secondary | ICD-10-CM | POA: Diagnosis not present

## 2017-12-28 DIAGNOSIS — Z6835 Body mass index (BMI) 35.0-35.9, adult: Secondary | ICD-10-CM | POA: Diagnosis not present

## 2017-12-28 LAB — HM PAP SMEAR

## 2017-12-31 ENCOUNTER — Encounter: Payer: Self-pay | Admitting: General Practice

## 2018-02-19 ENCOUNTER — Other Ambulatory Visit: Payer: Self-pay | Admitting: Family Medicine

## 2018-03-03 DIAGNOSIS — E119 Type 2 diabetes mellitus without complications: Secondary | ICD-10-CM | POA: Diagnosis not present

## 2018-03-21 DIAGNOSIS — Z6835 Body mass index (BMI) 35.0-35.9, adult: Secondary | ICD-10-CM | POA: Diagnosis not present

## 2018-03-21 DIAGNOSIS — N939 Abnormal uterine and vaginal bleeding, unspecified: Secondary | ICD-10-CM | POA: Diagnosis not present

## 2018-04-13 LAB — HM DIABETES EYE EXAM

## 2018-04-25 DIAGNOSIS — Z6834 Body mass index (BMI) 34.0-34.9, adult: Secondary | ICD-10-CM | POA: Diagnosis not present

## 2018-04-25 DIAGNOSIS — Q51810 Arcuate uterus: Secondary | ICD-10-CM | POA: Diagnosis not present

## 2018-04-25 DIAGNOSIS — N921 Excessive and frequent menstruation with irregular cycle: Secondary | ICD-10-CM | POA: Diagnosis not present

## 2018-05-09 ENCOUNTER — Telehealth: Payer: Self-pay | Admitting: General Practice

## 2018-05-09 DIAGNOSIS — E119 Type 2 diabetes mellitus without complications: Secondary | ICD-10-CM

## 2018-05-09 DIAGNOSIS — E559 Vitamin D deficiency, unspecified: Secondary | ICD-10-CM

## 2018-05-09 NOTE — Telephone Encounter (Signed)
Would I be ok to have pt come in earlier for labs? Last CPE was 06/09/17.   Copied from CRM [id number]. Topic: General - Other >> May 09, 2018 11:16 AM Marylen Ponto wrote: Reason for CRM: Pt states she has a form (Biometric screening) that needs to be completed by 06/10/18. Pt has an appt for a physical scheduled on 06/13/18 at 10 am. Pt would like to know if her previous labs could be used to complete the form. Pt requests a call back.

## 2018-05-10 NOTE — Addendum Note (Signed)
Addended by: Geannie Risen on: 05/10/2018 11:47 AM   Modules accepted: Orders

## 2018-05-10 NOTE — Telephone Encounter (Signed)
Ok for labs- Dx- DM CBC BMP LFTs Lipids TSH A1C Urine Microalbumin Dx- Vit D deficiency Vit D 25 hydroxy

## 2018-05-10 NOTE — Telephone Encounter (Signed)
Labs ordered, will call pt to schedule.

## 2018-05-10 NOTE — Telephone Encounter (Signed)
Pt scheduled for 06/07/18. Labs ordered.

## 2018-05-22 ENCOUNTER — Other Ambulatory Visit: Payer: Self-pay | Admitting: Family Medicine

## 2018-05-31 ENCOUNTER — Other Ambulatory Visit (INDEPENDENT_AMBULATORY_CARE_PROVIDER_SITE_OTHER): Payer: BLUE CROSS/BLUE SHIELD

## 2018-05-31 DIAGNOSIS — E559 Vitamin D deficiency, unspecified: Secondary | ICD-10-CM

## 2018-05-31 DIAGNOSIS — E669 Obesity, unspecified: Secondary | ICD-10-CM | POA: Diagnosis not present

## 2018-05-31 DIAGNOSIS — E119 Type 2 diabetes mellitus without complications: Secondary | ICD-10-CM | POA: Diagnosis not present

## 2018-05-31 LAB — LIPID PANEL
Cholesterol: 148 mg/dL (ref 0–200)
HDL: 34.1 mg/dL — ABNORMAL LOW (ref >39.00)
NonHDL: 113.7
Total CHOL/HDL Ratio: 4
Triglycerides: 271 mg/dL — ABNORMAL HIGH (ref 0.0–149.0)
VLDL: 54.2 mg/dL — ABNORMAL HIGH (ref 0.0–40.0)

## 2018-05-31 LAB — CBC WITH DIFFERENTIAL/PLATELET
Basophils Absolute: 0.1 10*3/uL (ref 0.0–0.1)
Basophils Relative: 0.9 % (ref 0.0–3.0)
Eosinophils Absolute: 0.2 10*3/uL (ref 0.0–0.7)
Eosinophils Relative: 2 % (ref 0.0–5.0)
HCT: 40.2 % (ref 36.0–46.0)
Hemoglobin: 13.4 g/dL (ref 12.0–15.0)
Lymphocytes Relative: 26 % (ref 12.0–46.0)
Lymphs Abs: 2.2 10*3/uL (ref 0.7–4.0)
MCHC: 33.3 g/dL (ref 30.0–36.0)
MCV: 81.1 fl (ref 78.0–100.0)
Monocytes Absolute: 0.4 10*3/uL (ref 0.1–1.0)
Monocytes Relative: 4.8 % (ref 3.0–12.0)
Neutro Abs: 5.6 10*3/uL (ref 1.4–7.7)
Neutrophils Relative %: 66.3 % (ref 43.0–77.0)
Platelets: 351 10*3/uL (ref 150.0–400.0)
RBC: 4.95 Mil/uL (ref 3.87–5.11)
RDW: 14 % (ref 11.5–15.5)
WBC: 8.5 10*3/uL (ref 4.0–10.5)

## 2018-05-31 LAB — HEMOGLOBIN A1C: Hgb A1c MFr Bld: 9 % — ABNORMAL HIGH (ref 4.6–6.5)

## 2018-05-31 LAB — BASIC METABOLIC PANEL
BUN: 8 mg/dL (ref 6–23)
CO2: 24 mEq/L (ref 19–32)
Calcium: 9.7 mg/dL (ref 8.4–10.5)
Chloride: 96 mEq/L (ref 96–112)
Creatinine, Ser: 0.57 mg/dL (ref 0.40–1.20)
GFR: 133.91 mL/min (ref >60.00)
Glucose, Bld: 308 mg/dL — ABNORMAL HIGH (ref 70–99)
Potassium: 4.3 mEq/L (ref 3.5–5.1)
Sodium: 132 mEq/L — ABNORMAL LOW (ref 135–145)

## 2018-05-31 LAB — HEPATIC FUNCTION PANEL
ALT: 52 U/L — ABNORMAL HIGH (ref 0–35)
AST: 142 U/L — ABNORMAL HIGH (ref 0–37)
Albumin: 4.3 g/dL (ref 3.5–5.2)
Alkaline Phosphatase: 70 U/L (ref 39–117)
Bilirubin, Direct: 0.1 mg/dL (ref 0.0–0.3)
Total Bilirubin: 0.5 mg/dL (ref 0.2–1.2)
Total Protein: 8 g/dL (ref 6.0–8.3)

## 2018-05-31 LAB — MICROALBUMIN / CREATININE URINE RATIO
Creatinine,U: 208.9 mg/dL
Microalb Creat Ratio: 15.9 mg/g (ref 0.0–30.0)
Microalb, Ur: 33.2 mg/dL — ABNORMAL HIGH (ref 0.0–1.9)

## 2018-05-31 LAB — LDL CHOLESTEROL, DIRECT: Direct LDL: 99 mg/dL

## 2018-05-31 LAB — VITAMIN D 25 HYDROXY (VIT D DEFICIENCY, FRACTURES): VITD: 23.72 ng/mL — ABNORMAL LOW (ref 30.00–100.00)

## 2018-05-31 LAB — TSH: TSH: 3.78 u[IU]/mL (ref 0.35–4.50)

## 2018-06-01 ENCOUNTER — Other Ambulatory Visit: Payer: Self-pay | Admitting: General Practice

## 2018-06-01 DIAGNOSIS — R7989 Other specified abnormal findings of blood chemistry: Secondary | ICD-10-CM

## 2018-06-01 DIAGNOSIS — R945 Abnormal results of liver function studies: Principal | ICD-10-CM

## 2018-06-01 MED ORDER — METFORMIN HCL 1000 MG PO TABS: 1000.0000 mg | ORAL_TABLET | Freq: Two times a day (BID) | ORAL | 1 refills | Status: DC

## 2018-06-01 MED ORDER — VITAMIN D (ERGOCALCIFEROL) 1.25 MG (50000 UNIT) PO CAPS: 50000.0000 [IU] | ORAL_CAPSULE | ORAL | 0 refills | Status: DC

## 2018-06-07 ENCOUNTER — Other Ambulatory Visit: Payer: BLUE CROSS/BLUE SHIELD

## 2018-06-13 ENCOUNTER — Other Ambulatory Visit: Payer: Self-pay

## 2018-06-13 ENCOUNTER — Encounter: Payer: Self-pay | Admitting: Family Medicine

## 2018-06-13 ENCOUNTER — Other Ambulatory Visit: Payer: BLUE CROSS/BLUE SHIELD

## 2018-06-13 ENCOUNTER — Ambulatory Visit (INDEPENDENT_AMBULATORY_CARE_PROVIDER_SITE_OTHER): Payer: BLUE CROSS/BLUE SHIELD | Admitting: Family Medicine

## 2018-06-13 VITALS — BP 121/81 | HR 89 | Temp 98.1°F | Resp 16 | Ht 65.0 in | Wt 199.2 lb

## 2018-06-13 DIAGNOSIS — R945 Abnormal results of liver function studies: Secondary | ICD-10-CM | POA: Diagnosis not present

## 2018-06-13 DIAGNOSIS — Z0000 Encounter for general adult medical examination without abnormal findings: Secondary | ICD-10-CM | POA: Diagnosis not present

## 2018-06-13 DIAGNOSIS — R7989 Other specified abnormal findings of blood chemistry: Secondary | ICD-10-CM

## 2018-06-13 LAB — HEPATIC FUNCTION PANEL
ALT: 58 U/L — ABNORMAL HIGH (ref 0–35)
AST: 124 U/L — ABNORMAL HIGH (ref 0–37)
Albumin: 4.3 g/dL (ref 3.5–5.2)
Alkaline Phosphatase: 64 U/L (ref 39–117)
Bilirubin, Direct: 0.1 mg/dL (ref 0.0–0.3)
Total Bilirubin: 0.5 mg/dL (ref 0.2–1.2)
Total Protein: 8 g/dL (ref 6.0–8.3)

## 2018-06-13 LAB — BASIC METABOLIC PANEL
BUN: 7 mg/dL (ref 6–23)
CO2: 25 mEq/L (ref 19–32)
Calcium: 9.5 mg/dL (ref 8.4–10.5)
Chloride: 100 mEq/L (ref 96–112)
Creatinine, Ser: 0.51 mg/dL (ref 0.40–1.20)
GFR: 152.21 mL/min (ref >60.00)
Glucose, Bld: 175 mg/dL — ABNORMAL HIGH (ref 70–99)
Potassium: 4.3 mEq/L (ref 3.5–5.1)
Sodium: 135 mEq/L (ref 135–145)

## 2018-06-13 NOTE — Patient Instructions (Addendum)
Follow up in Feb/March to recheck diabetes Continue to work on healthy diet and regular exercise- you can do it! Take the Metformin as directed. Call with any questions or concerns Happy Holidays!

## 2018-06-13 NOTE — Progress Notes (Signed)
   Subjective:    Patient ID: Kimberly Diaz, female    DOB: 1990/07/18, 27 y.o.   MRN: [id number]  HPI CPE- UTD on eye exam, due for foot exam.  UTD on GYN.  Pt is now exercising and working on low carb diet.   Review of Systems Patient reports no vision/ hearing changes, adenopathy,fever, weight change,  persistant/recurrent hoarseness , swallowing issues, chest pain, palpitations, edema, persistant/recurrent cough, hemoptysis, dyspnea (rest/exertional/paroxysmal nocturnal), gastrointestinal bleeding (melena, rectal bleeding), abdominal pain, significant heartburn, bowel changes, GU symptoms (dysuria, hematuria, incontinence), Gyn symptoms (abnormal  bleeding, pain),  syncope, focal weakness, memory loss, numbness & tingling, skin/hair/nail changes, abnormal bruising or bleeding, anxiety, or depression.     Objective:   Physical Exam General Appearance:    Alert, cooperative, no distress, appears stated age  Head:    Normocephalic, without obvious abnormality, atraumatic  Eyes:    PERRL, conjunctiva/corneas clear, EOM's intact, fundi    benign, both eyes  Ears:    Normal TM's and external ear canals, both ears  Nose:   Nares normal, septum midline, mucosa normal, no drainage    or sinus tenderness  Throat:   Lips, mucosa, and tongue normal; teeth and gums normal  Neck:   Supple, symmetrical, trachea midline, no adenopathy;    Thyroid: no enlargement/tenderness/nodules  Back:     Symmetric, no curvature, ROM normal, no CVA tenderness  Lungs:     Clear to auscultation bilaterally, respirations unlabored  Chest Wall:    No tenderness or deformity   Heart:    Regular rate and rhythm, S1 and S2 normal, no murmur, rub   or gallop  Breast Exam:    Deferred to GYN  Abdomen:     Soft, non-tender, bowel sounds active all four quadrants,    no masses, no organomegaly  Genitalia:    Deferred to GYN  Rectal:    Extremities:   Extremities normal, atraumatic, no cyanosis or edema  Pulses:   2+  and symmetric all extremities  Skin:   Skin color, texture, turgor normal, no rashes or lesions  Lymph nodes:   Cervical, supraclavicular, and axillary nodes normal  Neurologic:   CNII-XII intact, normal strength, sensation and reflexes    throughout         Assessment & Plan:

## 2018-06-13 NOTE — Assessment & Plan Note (Signed)
Pt's PE WNL w/ exception of obesity.  UTD on GYN, plans to get flu shot at [employer].  Reviewed recent labs.  Anticipatory guidance provided.

## 2018-06-17 ENCOUNTER — Telehealth: Payer: Self-pay | Admitting: Emergency Medicine

## 2018-06-17 NOTE — Telephone Encounter (Signed)
Copied from CRM [phone]. Topic: General - Other >> May 09, 2018 11:16 AM Kimberly Diaz, Ja-Kwan wrote: Reason for CRM: Pt states she has a form (Biometric screening) that needs to be completed by 06/10/18. Pt has an appt for a physical scheduled on 06/13/18 at 10 am. Pt would like to know if her previous labs could be used to complete the form. Pt requests a call back. >> Jun 17, 2018  1:20 PM [name], [name] wrote: Patient is calling in about form. She states she has received a notification from insurance that they havent received it yet. Please advise

## 2018-06-17 NOTE — Telephone Encounter (Signed)
Paperwork ws sent to scanning. Fax was confirmed. Resent again today.

## 2018-07-07 ENCOUNTER — Telehealth: Payer: Self-pay | Admitting: Family Medicine

## 2018-07-07 MED ORDER — METFORMIN HCL 1000 MG PO TABS: 1000.0000 mg | ORAL_TABLET | Freq: Two times a day (BID) | ORAL | 1 refills | Status: DC

## 2018-07-07 NOTE — Telephone Encounter (Signed)
Medication filled to pharmacy as requested.   

## 2018-07-07 NOTE — Telephone Encounter (Signed)
Copied from CRM [id number]. Topic: Quick Communication - Rx Refill/Question >> Jul 07, 2018  3:05 PM Fanny Bien wrote: Medication: metFORMIN (GLUCOPHAGE) 1000 MG tablet [[id number]]  insurance will no longer pay at [facility].   Has the patient contacted their pharmacy? no Preferred Pharmacy (with phone number or street name): [facility] - [place], [place] - [address] [phone] (Phone) [phone] (Fax)    Agent: Please be advised that RX refills may take up to 3 business days. We ask that you follow-up with your pharmacy.

## 2018-08-31 ENCOUNTER — Encounter: Payer: Self-pay | Admitting: Family Medicine

## 2018-08-31 ENCOUNTER — Other Ambulatory Visit: Payer: Self-pay

## 2018-08-31 ENCOUNTER — Ambulatory Visit (INDEPENDENT_AMBULATORY_CARE_PROVIDER_SITE_OTHER): Payer: BLUE CROSS/BLUE SHIELD | Admitting: Family Medicine

## 2018-08-31 VITALS — BP 120/80 | HR 78 | Temp 98.1°F | Resp 16 | Ht 65.0 in | Wt 193.2 lb

## 2018-08-31 DIAGNOSIS — E669 Obesity, unspecified: Secondary | ICD-10-CM | POA: Diagnosis not present

## 2018-08-31 DIAGNOSIS — E119 Type 2 diabetes mellitus without complications: Secondary | ICD-10-CM

## 2018-08-31 LAB — BASIC METABOLIC PANEL
BUN: 9 mg/dL (ref 6–23)
CO2: 28 mEq/L (ref 19–32)
Calcium: 9.5 mg/dL (ref 8.4–10.5)
Chloride: 99 mEq/L (ref 96–112)
Creatinine, Ser: 0.61 mg/dL (ref 0.40–1.20)
GFR: 116.29 mL/min (ref >60.00)
Glucose, Bld: 164 mg/dL — ABNORMAL HIGH (ref 70–99)
Potassium: 4.3 mEq/L (ref 3.5–5.1)
Sodium: 137 mEq/L (ref 135–145)

## 2018-08-31 LAB — HEMOGLOBIN A1C: Hgb A1c MFr Bld: 8.4 % — ABNORMAL HIGH (ref 4.6–6.5)

## 2018-08-31 NOTE — Assessment & Plan Note (Signed)
Ongoing issue for pt.  She is not able to tolerate AM dose of Metformin due to GI issues.  Will decrease to 500mg  daily.  Applauded her efforts at diet and exercise.  UTD on foot exam, eye exam, and microalbumin.  Check labs.  Adjust meds prn

## 2018-08-31 NOTE — Progress Notes (Signed)
   Subjective:    Patient ID: Kimberly Diaz, female    DOB: Mar 02, 1991, 28 y.o.   MRN: [id number]  HPI DM- chronic problem, on Metformin 1000mg  BID.  UTD on eye exam, foot exam, microalbumin.  Pt is down 6 lbs since last visit.  Having difficulty w/ Metformin in the AM due to GI issues.  No CP, SOB, HAs, numbness/tingling hands/feet.  Denies symptomatic lows.  Was able to take Metformin XR 500mg .  Obesity- pt is down 6 lbs.  Pt is tracking meals, trainging for 5k in April.   Review of Systems For ROS see HPI     Objective:   Physical Exam Vitals signs reviewed.  Constitutional:      General: She is not in acute distress.    Appearance: She is well-developed. She is obese.  HENT:     Head: Normocephalic and atraumatic.  Eyes:     Conjunctiva/sclera: Conjunctivae normal.     Pupils: Pupils are equal, round, and reactive to light.  Neck:     Musculoskeletal: Normal range of motion and neck supple.     Thyroid: No thyromegaly.  Cardiovascular:     Rate and Rhythm: Normal rate and regular rhythm.     Heart sounds: Normal heart sounds. No murmur.  Pulmonary:     Effort: Pulmonary effort is normal. No respiratory distress.     Breath sounds: Normal breath sounds.  Abdominal:     General: There is no distension.     Palpations: Abdomen is soft.     Tenderness: There is no abdominal tenderness.  Lymphadenopathy:     Cervical: No cervical adenopathy.  Skin:    General: Skin is warm and dry.  Neurological:     Mental Status: She is alert and oriented to person, place, and time.  Psychiatric:        Behavior: Behavior normal.           Assessment & Plan:

## 2018-08-31 NOTE — Assessment & Plan Note (Signed)
Pt is down 6 lbs.  Applauded her efforts.  Will continue to follow.   

## 2018-08-31 NOTE — Patient Instructions (Addendum)
Follow up in 3-4 months to recheck sugar and cholesterol We'll notify you of your lab results and make any changes if needed Keep up the good work on healthy diet and regular exercise- you're doing great!!! DECREASE the AM Metformin to 1/2 tab Call with any questions or concerns Happy Spring!!!

## 2018-12-26 ENCOUNTER — Ambulatory Visit: Payer: BLUE CROSS/BLUE SHIELD | Admitting: Family Medicine

## 2019-01-02 ENCOUNTER — Encounter: Payer: Self-pay | Admitting: Family Medicine

## 2019-01-02 ENCOUNTER — Ambulatory Visit (INDEPENDENT_AMBULATORY_CARE_PROVIDER_SITE_OTHER): Payer: BC Managed Care – PPO | Admitting: Family Medicine

## 2019-01-02 ENCOUNTER — Other Ambulatory Visit: Payer: Self-pay

## 2019-01-02 VITALS — BP 123/80 | Ht 65.0 in | Wt 191.2 lb

## 2019-01-02 DIAGNOSIS — E119 Type 2 diabetes mellitus without complications: Secondary | ICD-10-CM | POA: Diagnosis not present

## 2019-01-02 DIAGNOSIS — R945 Abnormal results of liver function studies: Secondary | ICD-10-CM | POA: Diagnosis not present

## 2019-01-02 DIAGNOSIS — E669 Obesity, unspecified: Secondary | ICD-10-CM

## 2019-01-02 DIAGNOSIS — R7989 Other specified abnormal findings of blood chemistry: Secondary | ICD-10-CM | POA: Insufficient documentation

## 2019-01-02 NOTE — Progress Notes (Signed)
   Virtual Visit via Video   I connected with patient on 01/02/19 at  9:00 AM EDT by a video enabled telemedicine application and verified that I am speaking with the correct person using two identifiers.  Location patient: Home Location provider: Acupuncturist, Office Persons participating in the virtual visit: Patient, Provider, [place] (Jess B)  I discussed the limitations of evaluation and management by telemedicine and the availability of in person appointments. The patient expressed understanding and agreed to proceed.  Subjective:   HPI:   DM- chronic problem, on Metformin 1000mg  BID but only able to tolerate once daily.  Pt reports feeling good.  Has switched from going to the gym to walking in the neighborhood ~3x/week.  No CP, SOB, HAs, visual changes, abd pain, N/V.  No numbness/tingling of hands/feet.  Denies symptomatic lows.  Not checking home CBGs.  Elevated LFTs- pt has hx of elevated AST/ALT.  Plan was to get sugar under control and work on carb intake to lower these numbers  Obesity- ongoing issue for pt.  She has not lost weight since last visit.  Working on low carb diet.  No longer eating out.  ROS:   See pertinent positives and negatives per HPI.  Patient Active Problem List   Diagnosis Date Noted  . Physical exam 07/29/2015  . Type 2 diabetes mellitus without complication ([place]) 82/50/5397  . Obesity (BMI 30-39.9) 09/20/2014  . Anxiety state 09/20/2014  . PCOS (polycystic ovarian syndrome) 09/20/2014    Social History   Tobacco Use  . Smoking status: Never Smoker  . Smokeless tobacco: Never Used  Substance Use Topics  . Alcohol use: No    Current Outpatient Medications:  .  citalopram (CELEXA) 40 MG tablet, TAKE 1 TABLET DAILY, Disp: 90 tablet, Rfl: 4 .  ESTARYLLA 0.25-35 MG-MCG tablet, , Disp: , Rfl:  .  metFORMIN (GLUCOPHAGE) 1000 MG tablet, Take 1 tablet (1,000 mg total) by mouth 2 (two) times daily with a meal., Disp: 180 tablet, Rfl: 1  No  Known Allergies  Objective:   BP 123/80   Ht 5\' 5"  (1.651 m)   Wt 191 lb 4 oz (86.8 kg)   BMI 31.83 kg/m  AAOx3, NAD NCAT, EOMI No obvious CN deficits Coloring WNL Pt is able to speak clearly, coherently without shortness of breath or increased work of breathing.  Thought process is linear.  Mood is appropriate.   Assessment and Plan:   DM- chronic problem.  Taking Metformin 1000mg  daily but was not able to tolerate BID.  UTD on foot exam, eye exam, and microalbumin.  Encouraged low carb diet and regular exercise.  Check labs.  Adjust meds prn   Elevated LFTs- this occurred when pt's sugars were very high.  Had started to improve w/ low carb diet.  Check labs.  If still elevated, may need GI referral.  Obesity- ongoing issue for pt.  Stressed need for healthy diet and regular exercise.  Continue to follow.  Annye Asa, MD 01/02/2019

## 2019-01-02 NOTE — Progress Notes (Signed)
I have discussed the procedure for the virtual visit with the patient who has given consent to proceed with assessment and treatment.   Kimberly Diaz Charleen Madera, CMA     

## 2019-01-03 ENCOUNTER — Telehealth: Payer: Self-pay

## 2019-01-03 NOTE — Telephone Encounter (Signed)

## 2019-01-04 ENCOUNTER — Other Ambulatory Visit (INDEPENDENT_AMBULATORY_CARE_PROVIDER_SITE_OTHER): Payer: BC Managed Care – PPO

## 2019-01-04 DIAGNOSIS — E119 Type 2 diabetes mellitus without complications: Secondary | ICD-10-CM | POA: Diagnosis not present

## 2019-01-04 DIAGNOSIS — R7989 Other specified abnormal findings of blood chemistry: Secondary | ICD-10-CM

## 2019-01-04 DIAGNOSIS — E669 Obesity, unspecified: Secondary | ICD-10-CM

## 2019-01-04 DIAGNOSIS — R945 Abnormal results of liver function studies: Secondary | ICD-10-CM

## 2019-01-04 LAB — LIPID PANEL
Cholesterol: 151 mg/dL (ref 0–200)
HDL: 35.1 mg/dL — ABNORMAL LOW (ref >39.00)
NonHDL: 116.35
Total CHOL/HDL Ratio: 4
Triglycerides: 366 mg/dL — ABNORMAL HIGH (ref 0.0–149.0)
VLDL: 73.2 mg/dL — ABNORMAL HIGH (ref 0.0–40.0)

## 2019-01-04 LAB — HEPATIC FUNCTION PANEL
ALT: 37 U/L — ABNORMAL HIGH (ref 0–35)
AST: 93 U/L — ABNORMAL HIGH (ref 0–37)
Albumin: 4.3 g/dL (ref 3.5–5.2)
Alkaline Phosphatase: 61 U/L (ref 39–117)
Bilirubin, Direct: 0.1 mg/dL (ref 0.0–0.3)
Total Bilirubin: 0.5 mg/dL (ref 0.2–1.2)
Total Protein: 7.7 g/dL (ref 6.0–8.3)

## 2019-01-04 LAB — CBC WITH DIFFERENTIAL/PLATELET
Basophils Absolute: 0.1 10*3/uL (ref 0.0–0.1)
Basophils Relative: 0.7 % (ref 0.0–3.0)
Eosinophils Absolute: 0.4 10*3/uL (ref 0.0–0.7)
Eosinophils Relative: 4.3 % (ref 0.0–5.0)
HCT: 42.2 % (ref 36.0–46.0)
Hemoglobin: 14 g/dL (ref 12.0–15.0)
Lymphocytes Relative: 29.3 % (ref 12.0–46.0)
Lymphs Abs: 2.5 10*3/uL (ref 0.7–4.0)
MCHC: 33.2 g/dL (ref 30.0–36.0)
MCV: 82.4 fl (ref 78.0–100.0)
Monocytes Absolute: 0.4 10*3/uL (ref 0.1–1.0)
Monocytes Relative: 5.3 % (ref 3.0–12.0)
Neutro Abs: 5.1 10*3/uL (ref 1.4–7.7)
Neutrophils Relative %: 60.4 % (ref 43.0–77.0)
Platelets: 308 10*3/uL (ref 150.0–400.0)
RBC: 5.12 Mil/uL — ABNORMAL HIGH (ref 3.87–5.11)
RDW: 14.1 % (ref 11.5–15.5)
WBC: 8.5 10*3/uL (ref 4.0–10.5)

## 2019-01-04 LAB — BASIC METABOLIC PANEL
BUN: 7 mg/dL (ref 6–23)
CO2: 27 mEq/L (ref 19–32)
Calcium: 9.4 mg/dL (ref 8.4–10.5)
Chloride: 99 mEq/L (ref 96–112)
Creatinine, Ser: 0.59 mg/dL (ref 0.40–1.20)
GFR: 120.56 mL/min (ref >60.00)
Glucose, Bld: 166 mg/dL — ABNORMAL HIGH (ref 70–99)
Potassium: 4.9 mEq/L (ref 3.5–5.1)
Sodium: 138 mEq/L (ref 135–145)

## 2019-01-04 LAB — HEMOGLOBIN A1C: Hgb A1c MFr Bld: 8.4 % — ABNORMAL HIGH (ref 4.6–6.5)

## 2019-01-04 LAB — TSH: TSH: 5.44 u[IU]/mL — ABNORMAL HIGH (ref 0.35–4.50)

## 2019-01-04 LAB — LDL CHOLESTEROL, DIRECT: Direct LDL: 84 mg/dL

## 2019-01-09 DIAGNOSIS — Z124 Encounter for screening for malignant neoplasm of cervix: Secondary | ICD-10-CM | POA: Diagnosis not present

## 2019-01-09 DIAGNOSIS — Z6832 Body mass index (BMI) 32.0-32.9, adult: Secondary | ICD-10-CM | POA: Diagnosis not present

## 2019-01-09 DIAGNOSIS — Z01419 Encounter for gynecological examination (general) (routine) without abnormal findings: Secondary | ICD-10-CM | POA: Diagnosis not present

## 2019-01-09 LAB — HM PAP SMEAR

## 2019-01-10 ENCOUNTER — Other Ambulatory Visit: Payer: Self-pay | Admitting: General Practice

## 2019-01-10 DIAGNOSIS — R7989 Other specified abnormal findings of blood chemistry: Secondary | ICD-10-CM

## 2019-01-10 DIAGNOSIS — E039 Hypothyroidism, unspecified: Secondary | ICD-10-CM

## 2019-01-10 MED ORDER — FENOFIBRATE 160 MG PO TABS: 160.0000 mg | ORAL_TABLET | Freq: Every day | ORAL | 3 refills | Status: DC

## 2019-01-10 MED ORDER — LEVOTHYROXINE SODIUM 50 MCG PO TABS: 50.0000 ug | ORAL_TABLET | Freq: Every day | ORAL | 3 refills | Status: DC

## 2019-01-13 ENCOUNTER — Encounter: Payer: Self-pay | Admitting: General Practice

## 2019-02-10 ENCOUNTER — Telehealth: Payer: Self-pay

## 2019-02-10 NOTE — Telephone Encounter (Signed)

## 2019-02-13 ENCOUNTER — Other Ambulatory Visit (INDEPENDENT_AMBULATORY_CARE_PROVIDER_SITE_OTHER): Payer: BC Managed Care – PPO

## 2019-02-13 DIAGNOSIS — E039 Hypothyroidism, unspecified: Secondary | ICD-10-CM

## 2019-02-13 LAB — TSH: TSH: 3.89 u[IU]/mL (ref 0.35–4.50)

## 2019-02-15 ENCOUNTER — Other Ambulatory Visit: Payer: BC Managed Care – PPO

## 2019-02-15 DIAGNOSIS — E119 Type 2 diabetes mellitus without complications: Secondary | ICD-10-CM | POA: Diagnosis not present

## 2019-02-15 LAB — HM DIABETES EYE EXAM

## 2019-02-20 ENCOUNTER — Telehealth: Payer: Self-pay | Admitting: *Deleted

## 2019-02-20 NOTE — Telephone Encounter (Signed)
Patient called in to see if Dr. Birdie Riddle thinks she is a candidate for [facility].  She states she has discussed this with her previously but was never really given an answer.  Patient aware that PCP is out of the office this week.

## 2019-02-20 NOTE — Telephone Encounter (Signed)
Spoke with pt. She was advised of PCP recommendations. She advised that because eye doctor did not see any evidence of retinopathy she was scheduled for surgery on 03/17/19. Pt would like to know if a fasting glucose would show her sugars have come down. I advised her that typically insurance will only cover A1c testing every 3 months and hers was just checked a month ago.

## 2019-02-20 NOTE — Telephone Encounter (Signed)
I would wait until A1C is under 8 before considering Lasix.  Elevated sugars can impact your visual acuity and I would hate for her to have surgery on something that is due to diabetes

## 2019-02-21 ENCOUNTER — Encounter: Payer: Self-pay | Admitting: General Practice

## 2019-02-22 NOTE — Telephone Encounter (Signed)
Left message for patient to call back  

## 2019-02-22 NOTE — Telephone Encounter (Signed)
Patient returned call and was given information. Stated verbal understanding.

## 2019-02-22 NOTE — Telephone Encounter (Signed)
As long as eye doctor is ok to proceed there is no reason to reschedule

## 2019-03-06 ENCOUNTER — Encounter (INDEPENDENT_AMBULATORY_CARE_PROVIDER_SITE_OTHER): Payer: Self-pay | Admitting: Ophthalmology

## 2019-03-07 ENCOUNTER — Other Ambulatory Visit: Payer: Self-pay

## 2019-03-07 ENCOUNTER — Encounter (INDEPENDENT_AMBULATORY_CARE_PROVIDER_SITE_OTHER): Payer: BC Managed Care – PPO | Admitting: Ophthalmology

## 2019-03-07 DIAGNOSIS — H43813 Vitreous degeneration, bilateral: Secondary | ICD-10-CM | POA: Diagnosis not present

## 2019-03-07 DIAGNOSIS — H5213 Myopia, bilateral: Secondary | ICD-10-CM

## 2019-04-12 ENCOUNTER — Other Ambulatory Visit: Payer: Self-pay | Admitting: Family Medicine

## 2019-07-15 ENCOUNTER — Other Ambulatory Visit: Payer: Self-pay | Admitting: Family Medicine

## 2019-08-14 DIAGNOSIS — Z3141 Encounter for fertility testing: Secondary | ICD-10-CM | POA: Diagnosis not present

## 2019-08-14 DIAGNOSIS — Z3169 Encounter for other general counseling and advice on procreation: Secondary | ICD-10-CM | POA: Diagnosis not present

## 2019-08-15 DIAGNOSIS — Z3141 Encounter for fertility testing: Secondary | ICD-10-CM | POA: Diagnosis not present

## 2019-08-16 ENCOUNTER — Encounter: Payer: Self-pay | Admitting: Family Medicine

## 2019-08-17 ENCOUNTER — Ambulatory Visit (INDEPENDENT_AMBULATORY_CARE_PROVIDER_SITE_OTHER): Payer: BC Managed Care – PPO | Admitting: Family Medicine

## 2019-08-17 ENCOUNTER — Other Ambulatory Visit: Payer: Self-pay

## 2019-08-17 ENCOUNTER — Encounter: Payer: Self-pay | Admitting: Family Medicine

## 2019-08-17 VITALS — BP 136/90 | HR 89 | Ht 65.0 in | Wt 185.4 lb

## 2019-08-17 DIAGNOSIS — E119 Type 2 diabetes mellitus without complications: Secondary | ICD-10-CM | POA: Diagnosis not present

## 2019-08-17 NOTE — Progress Notes (Signed)
I have discussed the procedure for the virtual visit with the patient who has given consent to proceed with assessment and treatment.   Glady L Suesan Mohrmann, CMA     

## 2019-08-17 NOTE — Progress Notes (Signed)
   Virtual Visit via Video   I connected with patient on 08/17/19 at  2:00 PM EST by a video enabled telemedicine application and verified that I am speaking with the correct person using two identifiers.  Location patient: Home Location provider: Astronomer, Office Persons participating in the virtual visit: Patient, Provider, CMA (Jess B)  I discussed the limitations of evaluation and management by telemedicine and the availability of in person appointments. The patient expressed understanding and agreed to proceed.  Subjective:   HPI:   DM- pt's A1C 9.8 at fertility specialist [facility]).  Pt is currently taking Metformin 1000mg  nightly.  She took a 1/2 tab this AM and had 'stomach ache'.  Pt reports she is trying to reduce her carb intake.  Pt reports she is walking regularly and started the 'Couch to 5K' program.  Pt has been losing weight, has cut out soda.  Pt reports feeling good- denies blood sugar spikes or crashes.   ROS:   See pertinent positives and negatives per HPI.  Patient Active Problem List   Diagnosis Date Noted  . Elevated LFTs 01/02/2019  . Physical exam 07/29/2015  . Type 2 diabetes mellitus without complication ([facility]) 11/19/2014  . Obesity (BMI 30-39.9) 09/20/2014  . Anxiety state 09/20/2014  . PCOS (polycystic ovarian syndrome) 09/20/2014    Social History   Tobacco Use  . Smoking status: Never Smoker  . Smokeless tobacco: Never Used  Substance Use Topics  . Alcohol use: No    Current Outpatient Medications:  .  citalopram (CELEXA) 40 MG tablet, TAKE 1 TABLET DAILY, Disp: 90 tablet, Rfl: 3 .  ESTARYLLA 0.25-35 MG-MCG tablet, , Disp: , Rfl:  .  metFORMIN (GLUCOPHAGE) 1000 MG tablet, TAKE 1 TABLET TWICE A DAY WITH MEALS, Disp: 180 tablet, Rfl: 3 .  fenofibrate 160 MG tablet, Take 1 tablet (160 mg total) by mouth daily. (Patient not taking: Reported on 08/17/2019), Disp: 30 tablet, Rfl: 3 .  levothyroxine (SYNTHROID) 50 MCG tablet, Take 1  tablet (50 mcg total) by mouth daily. (Patient not taking: Reported on 08/17/2019), Disp: 90 tablet, Rfl: 3  No Known Allergies  Objective:   BP 136/90   Pulse 89   Ht 5\' 5"  (1.651 m)   Wt 185 lb 6 oz (84.1 kg)   BMI 30.85 kg/m   AAOx3, NAD NCAT, EOMI No obvious CN deficits Coloring WNL Pt is able to speak clearly, coherently without shortness of breath or increased work of breathing.  Thought process is linear.  Mood is appropriate.   Assessment and Plan:   DM- deteriorated.  Pt's A1C at fertility specialist 9.8 despite improving diet, increasing exercise, and taking Metformin 1000mg  nightly.  She has not been doing the twice daily dose until this AM and it did cause upset stomach.  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  Since her desire is pregnancy, we need to get her A1C into a safe range but do so while using pregnancy friendly medications.  Will refer to Endo for complete evaluation and tx.  Pt expressed understanding and is in agreement w/ plan.   10/15/2019, MD 08/17/2019

## 2019-08-21 ENCOUNTER — Other Ambulatory Visit: Payer: Self-pay

## 2019-08-30 ENCOUNTER — Encounter: Payer: Self-pay | Admitting: Family Medicine

## 2019-09-05 ENCOUNTER — Encounter (INDEPENDENT_AMBULATORY_CARE_PROVIDER_SITE_OTHER): Payer: Self-pay | Admitting: Otolaryngology

## 2019-09-05 ENCOUNTER — Other Ambulatory Visit: Payer: Self-pay

## 2019-09-05 ENCOUNTER — Ambulatory Visit (INDEPENDENT_AMBULATORY_CARE_PROVIDER_SITE_OTHER): Payer: BC Managed Care – PPO | Admitting: Otolaryngology

## 2019-09-05 ENCOUNTER — Encounter: Payer: Self-pay | Admitting: Internal Medicine

## 2019-09-05 VITALS — Temp 98.1°F

## 2019-09-05 DIAGNOSIS — R040 Epistaxis: Secondary | ICD-10-CM

## 2019-09-05 NOTE — Progress Notes (Signed)
HPI: Kimberly Diaz is a 29 y.o. female who presents for evaluation of recurrent nosebleeds.  She has been having recurrent nosebleeds over the past month.  It frequently occurs from both sides and sometimes at the same time.  The last nosebleed she had was about a week ago.  Since cutting the heat down her nosebleeds have been better.  Past Medical History:  Diagnosis Date  . Anxiety   . Chronic back pain    muscle stiffness  . Diabetes mellitus without complication (HCC)    metformin  . Fatty liver   . Hx of varicella   . PCOS (polycystic ovarian syndrome)    possible    Past Surgical History:  Procedure Laterality Date  . CESAREAN SECTION N/A 11/06/2016   Procedure: CESAREAN SECTION;  Surgeon: Marlow Baars, MD;  Location: [facility] BIRTHING SUITES;  Service: Obstetrics;  Laterality: N/A;  . WISDOM TOOTH EXTRACTION  2012   Social History   Socioeconomic History  . Marital status: Married    Spouse name: Not on file  . Number of children: Not on file  . Years of education: Not on file  . Highest education level: Not on file  Occupational History  . Not on file  Tobacco Use  . Smoking status: Never Smoker  . Smokeless tobacco: Never Used  Substance and Sexual Activity  . Alcohol use: No  . Drug use: No  . Sexual activity: Yes    Birth control/protection: Other-see comments    Comment: will discuss with MD   Other Topics Concern  . Not on file  Social History Narrative  . Not on file   Social Determinants of Health   Financial Resource Strain:   . Difficulty of Paying Living Expenses: Not on file  Food Insecurity:   . Worried About Programme researcher, broadcasting/film/video in the Last Year: Not on file  . Ran Out of Food in the Last Year: Not on file  Transportation Needs:   . Lack of Transportation (Medical): Not on file  . Lack of Transportation (Non-Medical): Not on file  Physical Activity:   . Days of Exercise per Week: Not on file  . Minutes of Exercise per Session: Not on file   Stress:   . Feeling of Stress : Not on file  Social Connections:   . Frequency of Communication with Friends and Family: Not on file  . Frequency of Social Gatherings with Friends and Family: Not on file  . Attends Religious Services: Not on file  . Active Member of Clubs or Organizations: Not on file  . Attends Banker Meetings: Not on file  . Marital Status: Not on file   Family History  Problem Relation Age of Onset  . Hypertension Father   . Diabetes Father   . Cancer Maternal Grandfather   . Heart disease Maternal Grandfather   . Cancer Paternal Grandfather   . Breast cancer Paternal Grandmother    No Known Allergies Prior to Admission medications   Medication Sig Start Date End Date Taking? Authorizing Provider  citalopram (CELEXA) 40 MG tablet TAKE 1 TABLET DAILY 07/17/19  Yes Sheliah Hatch, MD  ESTARYLLA 0.25-35 MG-MCG tablet  08/15/18  Yes [provider]  metFORMIN (GLUCOPHAGE) 1000 MG tablet TAKE 1 TABLET TWICE A DAY WITH MEALS 04/12/19  Yes Sheliah Hatch, MD  fenofibrate 160 MG tablet Take 1 tablet (160 mg total) by mouth daily. Patient not taking: Reported on 08/17/2019 01/10/19   Sheliah Hatch,  MD  levothyroxine (SYNTHROID) 50 MCG tablet Take 1 tablet (50 mcg total) by mouth daily. Patient not taking: Reported on 08/17/2019 01/10/19   Midge Minium, MD     Positive ROS: Otherwise negative  All other systems have been reviewed and were otherwise negative with the exception of those mentioned in the HPI and as above.  Physical Exam: Constitutional: Alert, well-appearing, no acute distress Ears: External ears without lesions or tenderness. Ear canals are clear bilaterally with intact, clear TMs.  Nasal: External nose without lesions. Septum midline..  She has several vessels anteriorly along the septum on both sides but nothing has bled recently.  Remaining nasal cavity is clear.  On nasal endoscopy the posterior nasal cavity  and nasopharynx was clear. Oral: Lips and gums without lesions. Tongue and palate mucosa without lesions. Posterior oropharynx clear. Neck: No palpable adenopathy or masses Respiratory: Breathing comfortably  Skin: No facial/neck lesions or rash noted.  Nasal/sinus endoscopy  Date/Time: 09/05/2019 6:24 PM Performed by: Rozetta Nunnery, MD Authorized by: Rozetta Nunnery, MD   Consent:    Consent obtained:  Verbal   Consent given by:  Patient   Risks discussed:  Pain Procedure details:    Indications: sino-nasal symptoms     Medication:  Afrin   Instrument: flexible fiberoptic nasal endoscope     Scope location: bilateral nare   Septum:    Prominent septal vessels: anterior   Sinus:    Left middle meatus: normal     Right nasopharynx: normal   Comments:     On nasal endoscopy posterior nasal cavity was clear bilaterally.  Nasopharynx was clear bilaterally.  I suspect the bleeding originated from anterior septal vessels although she states that it bleeds from both sides.    Assessment: Recurrent epistaxis.  I cannot identify definite site or origin of recent epistaxis.  Plan: Recommended using nasal gel or lubrication to help reduce nosebleeds as well as reduce the dry heat. She will follow-up for cauterization if she continues to have recurrent nosebleeds.  Radene Journey, MD

## 2019-09-06 ENCOUNTER — Other Ambulatory Visit: Payer: Self-pay

## 2019-09-06 ENCOUNTER — Encounter: Payer: Self-pay | Admitting: Internal Medicine

## 2019-09-06 ENCOUNTER — Ambulatory Visit (INDEPENDENT_AMBULATORY_CARE_PROVIDER_SITE_OTHER): Payer: BC Managed Care – PPO | Admitting: Internal Medicine

## 2019-09-06 ENCOUNTER — Telehealth: Payer: Self-pay | Admitting: Internal Medicine

## 2019-09-06 VITALS — BP 142/92 | HR 81 | Temp 98.1°F | Ht 65.0 in | Wt 188.6 lb

## 2019-09-06 DIAGNOSIS — E781 Pure hyperglyceridemia: Secondary | ICD-10-CM

## 2019-09-06 DIAGNOSIS — E119 Type 2 diabetes mellitus without complications: Secondary | ICD-10-CM

## 2019-09-06 LAB — GLUCOSE, POCT (MANUAL RESULT ENTRY): POC Glucose: 266 mg/dl — AB (ref 70–99)

## 2019-09-06 MED ORDER — GLYBURIDE 2.5 MG PO TABS: 2.5000 mg | ORAL_TABLET | Freq: Every day | ORAL | 4 refills | Status: DC

## 2019-09-06 MED ORDER — METFORMIN HCL 1000 MG PO TABS: 1000.0000 mg | ORAL_TABLET | Freq: Two times a day (BID) | ORAL | 3 refills | Status: DC

## 2019-09-06 MED ORDER — GLUCOSE BLOOD VI STRP: ORAL_STRIP | 12 refills | Status: DC

## 2019-09-06 MED ORDER — METFORMIN HCL 1000 MG PO TABS: 1000.0000 mg | ORAL_TABLET | Freq: Every day | ORAL | 3 refills | Status: DC

## 2019-09-06 NOTE — Progress Notes (Signed)
Name: Kimberly Diaz  MRN/ DOB: [id number], August 10, 1990   Age/ Sex: 29 y.o., female    PCP: Sheliah Hatch, MD   Reason for Endocrinology Evaluation: Type 2 Diabetes Mellitus     Date of Initial Endocrinology Visit: 09/06/2019     PATIENT IDENTIFIER: Kimberly Diaz is a 29 y.o. female with a past medical history of T2DM and PCOS. The patient presented for initial endocrinology clinic visit on 09/06/2019 for consultative assistance with her diabetes management.    HPI: Kimberly Diaz was    Diagnosed with DM in 2016 Prior Medications tried/Intolerance: Metformin . Required insulin during 3rd trimester in 2018.  Currently checking blood sugars 0 x / day Hypoglycemia episodes : no  Hemoglobin A1c has ranged from 5.8% in 2018, peaking at 9.0% in 2019. Patient required assistance for hypoglycemia: no Patient has required hospitalization within the last 1 year from hyper or hypoglycemia:   In terms of diet, the patient eats 3 meals, snacks between meals. Cut out sodas recently but still would drink homemade sweet tea  Patient has a 51-year-old daughter at home, and is planning on conceiving in the near future  HOME DIABETES REGIMEN: Metformin 1000 mg daily - doesn't tolerate more   Statin: no ACE-I/ARB: no Prior Diabetic Education: yes   METER DOWNLOAD SUMMARY: Did not bring   DIABETIC COMPLICATIONS: Microvascular complications:    Denies: CKD, neuropathy   Last eye exam: Completed 03/2019  Macrovascular complications:    Denies: CAD, PVD, CVA   PAST HISTORY: Past Medical History:  Past Medical History:  Diagnosis Date  . Anxiety   . Chronic back pain    muscle stiffness  . Diabetes mellitus without complication (HCC)    metformin  . Fatty liver   . Hx of varicella   . PCOS (polycystic ovarian syndrome)    possible    Past Surgical History:  Past Surgical History:  Procedure Laterality Date  . CESAREAN SECTION N/A 11/06/2016   Procedure: CESAREAN  SECTION;  Surgeon: Marlow Baars, MD;  Location: [facility] BIRTHING SUITES;  Service: Obstetrics;  Laterality: N/A;  . WISDOM TOOTH EXTRACTION  2012      Social History:  reports that she has never smoked. She has never used smokeless tobacco. She reports that she does not drink alcohol or use drugs. Family History:  Family History  Problem Relation Age of Onset  . Hypertension Father   . Diabetes Father   . Cancer Maternal Grandfather   . Heart disease Maternal Grandfather   . Cancer Paternal Grandfather   . Breast cancer Paternal Grandmother      HOME MEDICATIONS: Allergies as of 09/06/2019   No Known Allergies     Medication List       Accurate as of September 06, 2019 12:22 PM. If you have any questions, ask your nurse or doctor.        citalopram 40 MG tablet Commonly known as: CELEXA TAKE 1 TABLET DAILY   Estarylla 0.25-35 MG-MCG tablet Generic drug: norgestimate-ethinyl estradiol   fenofibrate 160 MG tablet Take 1 tablet (160 mg total) by mouth daily.   glucose blood test strip Use as instructed to test blood sugar 2 times daily E11.9 Started by: Emilia Beck, CMA   glyBURIDE 2.5 MG tablet Commonly known as: DIABETA Take 1 tablet (2.5 mg total) by mouth daily with breakfast. Started by: Scarlette Shorts, MD   levothyroxine 50 MCG tablet Commonly known as: SYNTHROID Take 1 tablet (50 mcg total)  by mouth daily.   Melatonin 3 MG Tabs Take by mouth.   metFORMIN 1000 MG tablet Commonly known as: GLUCOPHAGE TAKE 1 TABLET TWICE A DAY WITH MEALS   ONE TOUCH COMBO PACK Misc by Does not apply route.   OneTouch Delica Plus [place]ancing Misc by Does not apply route.   PRE-NATAL PO Take by mouth.        ALLERGIES: No Known Allergies   REVIEW OF SYSTEMS: A comprehensive ROS was conducted with the patient and is negative except as per HPI and below:  Review of Systems  Gastrointestinal: Negative for diarrhea and nausea.  Genitourinary: Negative for  frequency.  Neurological: Negative for tingling.  Endo/Heme/Allergies: Negative for polydipsia.      OBJECTIVE:   VITAL SIGNS: BP (!) 142/92 (BP Location: Left Arm, Patient Position: Sitting, Cuff Size: Normal)   Pulse 81   Temp 98.1 F (36.7 C)   Ht 5\' 5"  (1.651 m)   Wt 188 lb 9.6 oz (85.5 kg)   SpO2 99%   BMI 31.38 kg/m    PHYSICAL EXAM:  General: Pt appears well and is in NAD  HEENT:  Eyes: External eye exam normal without stare, lid lag or exophthalmos.  EOM intact.    Neck: General: Supple without adenopathy or carotid bruits. Thyroid: Thyroid size normal.  No goiter or nodules appreciated. No thyroid bruit.  Lungs: Clear with good BS bilat with no rales, rhonchi, or wheezes  Heart: RRR with normal S1 and S2 and no gallops; no murmurs; no rub  Abdomen: Normoactive bowel sounds, soft, nontender, without masses or organomegaly palpable  Extremities:  Lower extremities - No pretibial edema. No lesions.  Skin: Normal texture and temperature to palpation.  Neuro: MS is good with appropriate affect, pt is alert and Ox3    DM foot exam: 09/06/2019  The skin of the feet is intact without sores or ulcerations. The pedal pulses are 2+ on right and 2+ on left. The sensation is intact to a screening 5.07, 10 gram monofilament bilaterally     DATA REVIEWED:  Lab Results  Component Value Date   HGBA1C 8.4 (H) 01/04/2019   HGBA1C 8.4 (H) 08/31/2018   HGBA1C 9.0 (H) 05/31/2018   Lab Results  Component Value Date   MICROALBUR 33.2 (H) 05/31/2018   LDLCALC 105 (H) 06/09/2017   CREATININE 0.59 01/04/2019   Lab Results  Component Value Date   MICRALBCREAT 15.9 05/31/2018    Lab Results  Component Value Date   CHOL 151 01/04/2019   HDL 35.10 (L) 01/04/2019   LDLCALC 105 (H) 06/09/2017   LDLDIRECT 84.0 01/04/2019   TRIG 366.0 (H) 01/04/2019   CHOLHDL 4 01/04/2019        ASSESSMENT / PLAN / RECOMMENDATIONS:   1) Type 2 Diabetes Mellitus, Poorly controlled, With  complications - Most recent A1c of 9.8% %. Goal A1c < 7.0 %.    Plan: GENERAL: I have discussed with the patient the pathophysiology of diabetes. We went over the natural progression of the disease. We talked about both insulin resistance and insulin deficiency. We stressed the importance of lifestyle changes including diet and exercise. I explained the complications associated with diabetes including retinopathy, nephropathy, neuropathy as well as increased risk of cardiovascular disease. We went over the benefit seen with glycemic control.    I explained to the patient that diabetic patients are at higher than normal risk for amputations.   I have advised the patient to avoid sugar sweetened beverages, and  to avoid snacks when possible, we did discuss low carb options for snacks if necessary.  She understands the goal of preconception A1c is 7.0%, we discussed A1c goal during pregnancy 6-6.5%  She is intolerant to higher doses of Metformin, she has been taking this at bedtime, patient encouraged to take this with dinner.  We did discuss add-on therapy with GLP-1 agonists, SGLT2 inhibitors, and sulfonylureas.  We discussed the risks the benefits of all classes, we also discussed that with GLP-1 agonist and SGLT2 inhibitors she will need to stop prior to conception due to lack of data.  We did discuss that glyburide is safe and has been used during pregnancy if it controls her, we also discussed that she may end up being on MDI regimen if glucose remains out of control during pregnancy.  Patient opted to try glyburide at this point, we did discuss the risk of hypoglycemia and weight gain  I have encouraged her to exercise at 175 minutes/week to offset the weight gain from the sulfonylurea.  Patient encouraged to check glucose at home  MEDICATIONS: Continue Metformin 1000 mg with Dinner Start Glyburide 2.5 mg with breakfast   EDUCATION / INSTRUCTIONS:  BG monitoring instructions: Patient  is instructed to check her blood sugars 2 times a day, fasting and bedtime.  Call [facility] if: BG persistently < 70 or > 300. . I reviewed the Rule of 15 for the treatment of hypoglycemia in detail with the patient. Literature supplied.   2) Diabetic complications:   Eye: Does not have known diabetic retinopathy.   Neuro/ Feet: Does not have known diabetic peripheral neuropathy.  Renal: Patient does not have known baseline CKD. She is  not on an ACEI/ARB at present.  3) Hypertriglyceridemia: We discussed low fat diet, we also discussed that improving glycemic control is going to improve her triglycerides.  No treatment will be offered at this time.   Follow-up in 3 months          Signed electronically by: Mack Guise, MD  [facility] Endocrinology  [facility] [address] [place], [place] [address] Phone: [phone] FAX: [phone]   CC: Midge Minium, MD [address] [place] [address] Phone: [phone]  Fax: [phone]    Return to Endocrinology clinic as below: Future Appointments  Date Time Provider [place]  12/07/2019  8:30 AM Taheera Thomann, Melanie Crazier, MD [id number] None

## 2019-09-06 NOTE — Telephone Encounter (Signed)
Patient called stating she needs glucose test scripts sent to  [id number] [place], [place] - [address] [facility] Phone:  [phone]  Fax:  [phone]     Patient also states her meter is the onetouch ultra mini.

## 2019-09-06 NOTE — Telephone Encounter (Signed)
Meter added to pt med list and refills for strips sent to requested pharmacy.

## 2019-09-06 NOTE — Telephone Encounter (Signed)
Patient is

## 2019-09-06 NOTE — Patient Instructions (Addendum)
-   Continue Metformin 1000 mg with Dinner - Start Glyburide 2.5 mg with breakfast     - Check sugar fasting and bedtime  - Exercise for 175 minutes per week    Choose healthy, lower carb lower calorie snacks: toss salad, cooked vegetables, cottage cheese, peanut butter, low fat cheese / string cheese, lower sodium deli meat, tuna salad or chicken salad     HOW TO TREAT LOW BLOOD SUGARS (Blood sugar LESS THAN 70 MG/DL)  Please follow the RULE OF 15 for the treatment of hypoglycemia treatment (when your (blood sugars are less than 70 mg/dL)    STEP 1: Take 15 grams of carbohydrates when your blood sugar is low, which includes:   3-4 GLUCOSE TABS  OR  3-4 OZ OF JUICE OR REGULAR SODA OR  ONE TUBE OF GLUCOSE GEL     STEP 2: RECHECK blood sugar in 15 MINUTES STEP 3: If your blood sugar is still low at the 15 minute recheck --> then, go back to STEP 1 and treat AGAIN with another 15 grams of carbohydrates.

## 2019-09-11 ENCOUNTER — Ambulatory Visit (INDEPENDENT_AMBULATORY_CARE_PROVIDER_SITE_OTHER): Payer: BC Managed Care – PPO | Admitting: Otolaryngology

## 2019-09-11 ENCOUNTER — Encounter (INDEPENDENT_AMBULATORY_CARE_PROVIDER_SITE_OTHER): Payer: Self-pay | Admitting: Otolaryngology

## 2019-09-11 ENCOUNTER — Other Ambulatory Visit: Payer: Self-pay

## 2019-09-11 VITALS — Temp 98.1°F

## 2019-09-11 DIAGNOSIS — R040 Epistaxis: Secondary | ICD-10-CM

## 2019-09-11 NOTE — Progress Notes (Signed)
HPI: Kimberly Diaz is a 29 y.o. female who returns today for evaluation of recurrent left-sided epistaxis.  She apparently had a bad nosebleed last night and presents here for follow-up as I was unable to identify a definite site of origin of her previous epistaxis.Marland Kitchen  Past Medical History:  Diagnosis Date  . Anxiety   . Chronic back pain    muscle stiffness  . Diabetes mellitus without complication (HCC)    metformin  . Fatty liver   . Hx of varicella   . PCOS (polycystic ovarian syndrome)    possible    Past Surgical History:  Procedure Laterality Date  . CESAREAN SECTION N/A 11/06/2016   Procedure: CESAREAN SECTION;  Surgeon: Marlow Baars, MD;  Location: [facility] BIRTHING SUITES;  Service: Obstetrics;  Laterality: N/A;  . WISDOM TOOTH EXTRACTION  2012   Social History   Socioeconomic History  . Marital status: Married    Spouse name: Not on file  . Number of children: Not on file  . Years of education: Not on file  . Highest education level: Not on file  Occupational History  . Not on file  Tobacco Use  . Smoking status: Never Smoker  . Smokeless tobacco: Never Used  Substance and Sexual Activity  . Alcohol use: No  . Drug use: No  . Sexual activity: Yes    Birth control/protection: Other-see comments    Comment: will discuss with MD   Other Topics Concern  . Not on file  Social History Narrative  . Not on file   Social Determinants of Health   Financial Resource Strain:   . Difficulty of Paying Living Expenses: Not on file  Food Insecurity:   . Worried About Programme researcher, broadcasting/film/video in the Last Year: Not on file  . Ran Out of Food in the Last Year: Not on file  Transportation Needs:   . Lack of Transportation (Medical): Not on file  . Lack of Transportation (Non-Medical): Not on file  Physical Activity:   . Days of Exercise per Week: Not on file  . Minutes of Exercise per Session: Not on file  Stress:   . Feeling of Stress : Not on file  Social Connections:   .  Frequency of Communication with Friends and Family: Not on file  . Frequency of Social Gatherings with Friends and Family: Not on file  . Attends Religious Services: Not on file  . Active Member of Clubs or Organizations: Not on file  . Attends Banker Meetings: Not on file  . Marital Status: Not on file   Family History  Problem Relation Age of Onset  . Hypertension Father   . Diabetes Father   . Cancer Maternal Grandfather   . Heart disease Maternal Grandfather   . Cancer Paternal Grandfather   . Breast cancer Paternal Grandmother    No Known Allergies Prior to Admission medications   Medication Sig Start Date End Date Taking? Authorizing Provider  citalopram (CELEXA) 40 MG tablet TAKE 1 TABLET DAILY 07/17/19   Sheliah Hatch, MD  ESTARYLLA 0.25-35 MG-MCG tablet  08/15/18   [provider]  glucose blood test strip Use as instructed to test blood sugar 2 times daily E11.9 09/06/19   Shamleffer, Konrad Dolores, MD  glyBURIDE (DIABETA) 2.5 MG tablet Take 1 tablet (2.5 mg total) by mouth daily with breakfast. 09/06/19   Shamleffer, Konrad Dolores, MD  Lancet Devices [facility] PLUS LANCING) MISC by Does not apply route.  [provider]  Lancets (ONE TOUCH COMBO PACK) MISC by Does not apply route.    [provider]  levothyroxine (SYNTHROID) 50 MCG tablet Take 1 tablet (50 mcg total) by mouth daily. Patient not taking: Reported on 08/17/2019 01/10/19   Midge Minium, MD  Melatonin 3 MG TABS Take by mouth.    [provider]  metFORMIN (GLUCOPHAGE) 1000 MG tablet Take 1 tablet (1,000 mg total) by mouth daily. 09/06/19   Shamleffer, Melanie Crazier, MD  Prenatal Multivit-Min-Fe-FA (PRE-NATAL PO) Take by mouth.    [provider]     Positive ROS: Otherwise negative  All other systems have been reviewed and were otherwise negative with the exception of those mentioned in the HPI and as above.  Physical  Exam: Constitutional: Alert, well-appearing, no acute distress Ears: External ears without lesions or tenderness. Ear canals are clear bilaterally with intact, clear TMs.  Nasal: External nose without lesions.  She has some scabbing and some fresh blood on the left side of the septum.  The right side was clear.  After cleaning the left side of the septum with a Q-tip and hydrogen peroxide she has some slight oozing along the inferior region of Kiesselbach's plexus.  This was cauterized using silver nitrate. Oral: Lips and gums without lesions. Tongue and palate mucosa without lesions. Posterior oropharynx clear. Neck: No palpable adenopathy or masses Respiratory: Breathing comfortably  Skin: No facial/neck lesions or rash noted.  Control of epistaxis  Date/Time: 09/11/2019 6:49 PM Performed by: Rozetta Nunnery, MD Authorized by: Rozetta Nunnery, MD   Consent:    Consent obtained:  Verbal   Consent given by:  Patient   Risks discussed:  Bleeding and pain   Alternatives discussed:  No treatment and observation Procedure details:    Treatment site:  L anterior   Treatment method:  Silver nitrate   Treatment complexity:  Limited Post-procedure details:    Patient tolerance of procedure:  Tolerated well, no immediate complications Comments:     Left anterior inferior septal Kiesselbach's plexus was cauterized using silver nitrate.    Assessment: Left-sided epistaxis  Plan: This was cauterized in the office today.  She will follow-up as needed any further bleeding.   Radene Journey, MD

## 2019-09-18 ENCOUNTER — Encounter: Payer: Self-pay | Admitting: Internal Medicine

## 2019-09-18 MED ORDER — GLYBURIDE 5 MG PO TABS: 5.0000 mg | ORAL_TABLET | Freq: Every day | ORAL | 1 refills | Status: DC

## 2019-09-29 ENCOUNTER — Encounter: Payer: Self-pay | Admitting: Internal Medicine

## 2019-09-29 MED ORDER — GLYBURIDE 5 MG PO TABS: 10.0000 mg | ORAL_TABLET | Freq: Every day | ORAL | 1 refills | Status: DC

## 2019-09-29 MED ORDER — METFORMIN HCL 1000 MG PO TABS: 1000.0000 mg | ORAL_TABLET | Freq: Two times a day (BID) | ORAL | 3 refills | Status: DC

## 2019-11-29 ENCOUNTER — Encounter: Payer: Self-pay | Admitting: Internal Medicine

## 2019-12-05 ENCOUNTER — Other Ambulatory Visit: Payer: Self-pay

## 2019-12-07 ENCOUNTER — Other Ambulatory Visit: Payer: Self-pay

## 2019-12-07 ENCOUNTER — Encounter: Payer: Self-pay | Admitting: Internal Medicine

## 2019-12-07 ENCOUNTER — Ambulatory Visit (INDEPENDENT_AMBULATORY_CARE_PROVIDER_SITE_OTHER): Payer: BC Managed Care – PPO | Admitting: Internal Medicine

## 2019-12-07 VITALS — BP 132/88 | HR 85 | Temp 97.8°F | Ht 65.0 in | Wt 191.2 lb

## 2019-12-07 DIAGNOSIS — E119 Type 2 diabetes mellitus without complications: Secondary | ICD-10-CM | POA: Diagnosis not present

## 2019-12-07 LAB — POCT GLYCOSYLATED HEMOGLOBIN (HGB A1C): Hemoglobin A1C: 7 % — AB (ref 4.0–5.6)

## 2019-12-07 MED ORDER — METFORMIN HCL 1000 MG PO TABS: ORAL_TABLET | ORAL | 3 refills | Status: DC

## 2019-12-07 MED ORDER — GLYBURIDE 5 MG PO TABS: 10.0000 mg | ORAL_TABLET | Freq: Two times a day (BID) | ORAL | 3 refills | Status: DC

## 2019-12-07 NOTE — Patient Instructions (Addendum)
-   Continue Metformin 1000 mg, Half a tablet with breakfast and 1 tablet with Supper - Increase Glyburide 5 mg, 2 tablets before breakfast and supper     HOW TO TREAT LOW BLOOD SUGARS (Blood sugar LESS THAN 70 MG/DL)  Please follow the RULE OF 15 for the treatment of hypoglycemia treatment (when your (blood sugars are less than 70 mg/dL)    STEP 1: Take 15 grams of carbohydrates when your blood sugar is low, which includes:   3-4 GLUCOSE TABS  OR  3-4 OZ OF JUICE OR REGULAR SODA OR  ONE TUBE OF GLUCOSE GEL     STEP 2: RECHECK blood sugar in 15 MINUTES STEP 3: If your blood sugar is still low at the 15 minute recheck --> then, go back to STEP 1 and treat AGAIN with another 15 grams of carbohydrates.

## 2019-12-07 NOTE — Progress Notes (Signed)
Name: Kimberly Diaz  Age/ Sex: 29 y.o., female   MRN/ DOB: [id number], 1990/10/07     PCP: Sheliah Hatch, MD   Reason for Endocrinology Evaluation: Type 2 Diabetes Mellitus  Initial Endocrine Consultative Visit: 09/06/2019    PATIENT IDENTIFIER: Kimberly Diaz is a 29 y.o. female with a past medical history of T2DM and PCOS. The patient has followed with Endocrinology clinic since 09/06/2019 for consultative assistance with management of her diabetes.  DIABETIC HISTORY:  Kimberly Diaz was diagnosed with DM in 2016, she has been on metformin since her diagnosis. She has required insulin during 3rd trimester in 2018. Her hemoglobin A1c has ranged from 5.8% in 2018, peaking at 9.0% in 2019.  On her initial visit to our clinic her A1c was 9.8% she was on metformin only, we started her on Glyburide and gradual titration of the dose.      Pt has a 2 yr old daughter at home, she is planning on conceiving in the near future SUBJECTIVE:   During the last visit (09/06/2019): A1c 9.8% . We continued Metformin and started Glyburide  Today (12/07/2019): Kimberly Diaz is here for a follow up on diabetes.  She checks her blood sugars 2 times daily, preprandial to breakfast and bedtime. The patient has not  had hypoglycemic episodes since the last clinic visit.    HOME DIABETES REGIMEN:  Metformin 1000 mg BID - taking half a tablet at breakfast and 1 tablet at supper  Glyburide 10 mg with breakfast    METER DOWNLOAD SUMMARY:   127- 306 mg/dL      DIABETIC COMPLICATIONS: Microvascular complications:    Denies: CKD, neuropathy   Last eye exam: Completed 03/2019  Macrovascular complications:    Denies: CAD, PVD, CVA   HISTORY:  Past Medical History:  Past Medical History:  Diagnosis Date  . Anxiety   . Chronic back pain    muscle stiffness  . Diabetes mellitus without complication (HCC)    metformin  . Fatty liver   . Hx of varicella   . PCOS (polycystic ovarian  syndrome)    possible    Past Surgical History:  Past Surgical History:  Procedure Laterality Date  . CESAREAN SECTION N/A 11/06/2016   Procedure: CESAREAN SECTION;  Surgeon: Marlow Baars, MD;  Location: [facility] BIRTHING SUITES;  Service: Obstetrics;  Laterality: N/A;  . WISDOM TOOTH EXTRACTION  2012    Social History:  reports that she has never smoked. She has never used smokeless tobacco. She reports that she does not drink alcohol or use drugs. Family History:  Family History  Problem Relation Age of Onset  . Hypertension Father   . Diabetes Father   . Cancer Maternal Grandfather   . Heart disease Maternal Grandfather   . Cancer Paternal Grandfather   . Breast cancer Paternal Grandmother      HOME MEDICATIONS: Allergies as of 12/07/2019   No Known Allergies     Medication List       Accurate as of December 07, 2019 11:55 AM. If you have any questions, ask your nurse or doctor.        citalopram 40 MG tablet Commonly known as: CELEXA TAKE 1 TABLET DAILY   Estarylla 0.25-35 MG-MCG tablet Generic drug: norgestimate-ethinyl estradiol   glucose blood test strip Use as instructed to test blood sugar 2 times daily E11.9   glyBURIDE 5 MG tablet Commonly known as: DIABETA Take 2 tablets (10 mg total) by mouth 2 (two) times  daily with a meal. What changed: when to take this Changed by: Scarlette Shorts, MD   levothyroxine 50 MCG tablet Commonly known as: SYNTHROID Take 1 tablet (50 mcg total) by mouth daily.   melatonin 3 MG Tabs tablet Take by mouth.   metFORMIN 1000 MG tablet Commonly known as: GLUCOPHAGE Take 0.5 tablets (500 mg total) by mouth daily with breakfast AND 1 tablet (1,000 mg total) daily with supper. What changed: See the new instructions. Changed by: Scarlette Shorts, MD   ONE TOUCH COMBO PACK Misc by Does not apply route.   OneTouch Delica Plus Lancing Misc by Does not apply route.   PRE-NATAL PO Take by mouth.        OBJECTIVE:    Vital Signs: BP 132/88 (BP Location: Left Arm, Patient Position: Sitting, Cuff Size: Normal)   Pulse 85   Temp 97.8 F (36.6 C)   Ht 5\' 5"  (1.651 m)   Wt 191 lb 3.2 oz (86.7 kg)   LMP 11/13/2019 (Within Days)   SpO2 98%   BMI 31.82 kg/m   Wt Readings from Last 3 Encounters:  12/07/19 191 lb 3.2 oz (86.7 kg)  09/06/19 188 lb 9.6 oz (85.5 kg)  08/17/19 185 lb 6 oz (84.1 kg)     Exam: General: Pt appears well and is in NAD  Lungs: Clear with good BS bilat with no rales, rhonchi, or wheezes  Heart: RRR with normal S1 and S2 and no gallops; no murmurs; no rub  Abdomen: Normoactive bowel sounds, soft, nontender, without masses or organomegaly palpable  Extremities: No pretibial edema.   Neuro: MS is good with appropriate affect, pt is alert and Ox3         DM foot exam: 09/06/2019  The skin of the feet is intact without sores or ulcerations. The pedal pulses are 2+ on right and 2+ on left. The sensation is intact to a screening 5.07, 10 gram monofilament bilaterally    DATA REVIEWED:  Lab Results  Component Value Date   HGBA1C 7.0 (A) 12/07/2019   HGBA1C 8.4 (H) 01/04/2019   HGBA1C 8.4 (H) 08/31/2018   Lab Results  Component Value Date   MICROALBUR 33.2 (H) 05/31/2018   LDLCALC 105 (H) 06/09/2017   CREATININE 0.59 01/04/2019   Lab Results  Component Value Date   MICRALBCREAT 15.9 05/31/2018     Lab Results  Component Value Date   CHOL 151 01/04/2019   HDL 35.10 (L) 01/04/2019   LDLCALC 105 (H) 06/09/2017   LDLDIRECT 84.0 01/04/2019   TRIG 366.0 (H) 01/04/2019   CHOLHDL 4 01/04/2019         ASSESSMENT / PLAN / RECOMMENDATIONS:   1) Type 2 Diabetes Mellitus, Optimally controlled, Without complications - Most recent A1c of 7.0 %. Goal A1c < 7.0 %.    - A1c down from 9.8% , pt may start conception planning but will need to be on an MDI regimen.  -Her bedtime BG's are high running > 200 mg/dL but they trend down overnight, despite an A1c at goal these  readings will not be acceptable during pregnancy unless she works more on cutting her CHO intake - We used last night's example as a teaching moment ( ate a sandwich, few chips, half an orange followed by a snack that consisted of goat cheese and crackers) BG last night was 302 mg/dL  - We also discussed glucose goals during pregnancy as well as the need to check BG's before meal and  2-hr postprandial. We also discussed CGM , but pt has a high deductible medical plan and not sure the copay for this . - IN the meantime , she will continue on current regimen, we could switch her to MDI regimen once things have been confirmed as she is planning on seeing an infertility physician first.  - Intolerant to higher doses of metformin     MEDICATIONS: - Continue Metformin 1000 mg, Half a tablet with breakfast and 1 tablet with Supper - Increase Glyburide 5 mg, 2 tablets before breakfast and supper    EDUCATION / INSTRUCTIONS:  BG monitoring instructions: Patient is instructed to check her blood sugars 2 times a day, fasting and supper time.  Call [facility] if: BG persistently < 70 . I reviewed the Rule of 15 for the treatment of hypoglycemia in detail with the patient. Literature supplied.     2) Diabetic complications:   Eye: Does not have known diabetic retinopathy.   Neuro/ Feet: Does not have known diabetic peripheral neuropathy .   Renal: Patient does not have known baseline CKD.   F/U in 3 months    Signed electronically by: Mack Guise, MD  [facility] Endocrinology  [facility] [address] [place], [place] [address] Phone: [phone] FAX: [phone]   CC: Midge Minium, MD [address] [place] [address] Phone: [phone]  Fax: [phone]  Return to Endocrinology clinic as below: Future Appointments  Date Time Provider [place]  03/15/2020  9:50 AM Wyat Infinger, Melanie Crazier, MD  [id number] None

## 2020-01-24 ENCOUNTER — Telehealth: Payer: Self-pay | Admitting: Family Medicine

## 2020-01-24 MED ORDER — CITALOPRAM HYDROBROMIDE 40 MG PO TABS: 40.0000 mg | ORAL_TABLET | Freq: Every day | ORAL | 0 refills | Status: DC

## 2020-01-24 NOTE — Telephone Encounter (Signed)
Medication filled to pharmacy as requested.  Pt informed.   

## 2020-01-24 NOTE — Telephone Encounter (Signed)
Patient's script of Celexa was mailed to patients old address.  Patient is currently waiting on Rx to be routed back to her new address. She is currently out of medication. Would like to know if a small script can be sent to [facility] [address] in [place]?

## 2020-02-01 DIAGNOSIS — Z6832 Body mass index (BMI) 32.0-32.9, adult: Secondary | ICD-10-CM | POA: Diagnosis not present

## 2020-02-01 DIAGNOSIS — Z01419 Encounter for gynecological examination (general) (routine) without abnormal findings: Secondary | ICD-10-CM | POA: Diagnosis not present

## 2020-02-16 ENCOUNTER — Other Ambulatory Visit: Payer: Self-pay | Admitting: Family Medicine

## 2020-03-14 NOTE — Progress Notes (Signed)
Name: Kimberly Diaz  Age/ Sex: 29 y.o., female   MRN/ DOB: [id number], 1991/04/19     PCP: Sheliah Hatch, MD   Reason for Endocrinology Evaluation: Type 2 Diabetes Mellitus  Initial Endocrine Consultative Visit: 09/06/2019    PATIENT IDENTIFIER: Kimberly Diaz is a 29 y.o. female with a past medical history of T2DM and PCOS. The patient has followed with Endocrinology clinic since 09/06/2019 for consultative assistance with management of her diabetes.  DIABETIC HISTORY:  Kimberly Diaz was diagnosed with DM in 2016, she has been on metformin since her diagnosis. She has required insulin during 3rd trimester in 2018. Her hemoglobin A1c has ranged from 5.8% in 2018, peaking at 9.0% in 2019.  On her initial visit to our clinic her A1c was 9.8% she was on metformin only, we started her on Glyburide and gradual titration of the dose.    Pt has a 2 yr old daughter at home, she is planning on conceiving in the near future SUBJECTIVE:   During the last visit (12/07/2019): A1c 7.0 % . We continued Metformin and increased Glyburide  Today (03/15/2020): Kimberly Diaz is here for a follow up on diabetes. She has not been checking recently. The patient has not  had hypoglycemic episodes since the last clinic visit.    HOME DIABETES REGIMEN:  Metformin 1000 mg BID - taking half a tablet at breakfast and 1 tablet at supper - has been taking 1 table with supper  Glyburide 5 mg , 2 tabs BID - ( does not always take the morning dose but takes the evening dose)      METER DOWNLOAD SUMMARY: Did not bring     DIABETIC COMPLICATIONS: Microvascular complications:    Denies: CKD, neuropathy   Last eye exam: Completed 03/2019  Macrovascular complications:    Denies: CAD, PVD, CVA   HISTORY:  Past Medical History:  Past Medical History:  Diagnosis Date  . Anxiety   . Chronic back pain    muscle stiffness  . Diabetes mellitus without complication (HCC)    metformin  . Fatty  liver   . Hx of varicella   . PCOS (polycystic ovarian syndrome)    possible    Past Surgical History:  Past Surgical History:  Procedure Laterality Date  . CESAREAN SECTION N/A 11/06/2016   Procedure: CESAREAN SECTION;  Surgeon: Marlow Baars, MD;  Location: [facility] BIRTHING SUITES;  Service: Obstetrics;  Laterality: N/A;  . WISDOM TOOTH EXTRACTION  2012    Social History:  reports that she has never smoked. She has never used smokeless tobacco. She reports that she does not drink alcohol and does not use drugs. Family History:  Family History  Problem Relation Age of Onset  . Hypertension Father   . Diabetes Father   . Cancer Maternal Grandfather   . Heart disease Maternal Grandfather   . Cancer Paternal Grandfather   . Breast cancer Paternal Grandmother      HOME MEDICATIONS: Allergies as of 03/15/2020   No Known Allergies     Medication List       Accurate as of March 15, 2020 10:01 AM. If you have any questions, ask your nurse or doctor.        STOP taking these medications   levothyroxine 50 MCG tablet Commonly known as: SYNTHROID Stopped by: Scarlette Shorts, MD     TAKE these medications   citalopram 40 MG tablet Commonly known as: CELEXA TAKE 1 TABLET BY MOUTH EVERY DAY  Estarylla 0.25-35 MG-MCG tablet Generic drug: norgestimate-ethinyl estradiol   glucose blood test strip Use as instructed to test blood sugar 2 times daily E11.9   glyBURIDE 5 MG tablet Commonly known as: DIABETA Take 2 tablets (10 mg total) by mouth 2 (two) times daily with a meal.   melatonin 3 MG Tabs tablet Take by mouth.   metFORMIN 1000 MG tablet Commonly known as: GLUCOPHAGE Take 0.5 tablets (500 mg total) by mouth daily with breakfast AND 1 tablet (1,000 mg total) daily with supper.   ONE TOUCH COMBO PACK Misc by Does not apply route.   OneTouch Delica Plus Lancing Misc by Does not apply route.   PRE-NATAL PO Take by mouth.        OBJECTIVE:   Vital  Signs: BP 132/90 (BP Location: Left Arm, Patient Position: Sitting, Cuff Size: Normal)   Pulse 92   Ht 5\' 5"  (1.651 m)   Wt 197 lb 3.2 oz (89.4 kg)   SpO2 96%   BMI 32.82 kg/m   Wt Readings from Last 3 Encounters:  03/15/20 197 lb 3.2 oz (89.4 kg)  12/07/19 191 lb 3.2 oz (86.7 kg)  09/06/19 188 lb 9.6 oz (85.5 kg)     Exam: General: Pt appears well and is in NAD  Lungs: Clear with good BS bilat with no rales, rhonchi, or wheezes  Heart: RRR with normal S1 and S2 and no gallops; no murmurs; no rub  Abdomen: Normoactive bowel sounds, soft, nontender, without masses or organomegaly palpable  Extremities: No pretibial edema.   Neuro: MS is good with appropriate affect, pt is alert and Ox3         DM foot exam: 09/06/2019   The skin of the feet is intact without sores or ulcerations. The pedal pulses are 2+ on right and 2+ on left. The sensation is intact to a screening 5.07, 10 gram monofilament bilaterally    DATA REVIEWED:  Lab Results  Component Value Date   HGBA1C 6.8 (A) 03/15/2020   HGBA1C 7.0 (A) 12/07/2019   HGBA1C 8.4 (H) 01/04/2019   Lab Results  Component Value Date   MICROALBUR 33.2 (H) 05/31/2018   LDLCALC 105 (H) 06/09/2017   CREATININE 0.59 01/04/2019   Lab Results  Component Value Date   MICRALBCREAT 15.9 05/31/2018     Lab Results  Component Value Date   CHOL 151 01/04/2019   HDL 35.10 (L) 01/04/2019   LDLCALC 105 (H) 06/09/2017   LDLDIRECT 84.0 01/04/2019   TRIG 366.0 (H) 01/04/2019   CHOLHDL 4 01/04/2019         ASSESSMENT / PLAN / RECOMMENDATIONS:   1) Type 2 Diabetes Mellitus, Optimally controlled, Without complications - Most recent A1c of 6.8 %. Goal A1c < 7.0 %.    - Pt with medication non adherence, but she has been doing better with low carb diet.  - Forgot meter today , I have encouraged her to check Bg's more regular  - She is intolerant to metformin, but she will try increasing it to 2 tabs at night. - I went over with the  patient various strategies as to how she can be reminded to take the first dose of Glyburide during the day given her schedule - Per infertility clinic , A1c goal is 6.0% to start treatment. I have offered to switch her to MDI regimen now if she wishes to get there quicker, she will discuss with husband    MEDICATIONS: - Increase Metformin 1000 mg,to 2  tabs at night  - Continue  Glyburide 5 mg, 2 tablets before breakfast and supper    EDUCATION / INSTRUCTIONS:  BG monitoring instructions: Patient is instructed to check her blood sugars 2 times a day, fasting and supper time.  Call [facility] if: BG persistently < 70 . I reviewed the Rule of 15 for the treatment of hypoglycemia in detail with the patient. Literature supplied.     2) Diabetic complications:   Eye: Does not have known diabetic retinopathy.   Neuro/ Feet: Does not have known diabetic peripheral neuropathy .   Renal: Patient does not have known baseline CKD.   F/U in 4 months    Signed electronically by: Lyndle Herrlich, MD  [facility] Endocrinology  [facility] [address] [place] [place], [place] [address] Phone: [phone] FAX: [phone]   CC: Sheliah Hatch, MD [address] [place] [address] Phone: [phone]  Fax: [phone]  Return to Endocrinology clinic as below: No future appointments.

## 2020-03-15 ENCOUNTER — Ambulatory Visit (INDEPENDENT_AMBULATORY_CARE_PROVIDER_SITE_OTHER): Payer: BC Managed Care – PPO | Admitting: Internal Medicine

## 2020-03-15 ENCOUNTER — Encounter: Payer: Self-pay | Admitting: Internal Medicine

## 2020-03-15 ENCOUNTER — Other Ambulatory Visit: Payer: Self-pay

## 2020-03-15 VITALS — BP 132/90 | HR 92 | Ht 65.0 in | Wt 197.2 lb

## 2020-03-15 DIAGNOSIS — E119 Type 2 diabetes mellitus without complications: Secondary | ICD-10-CM | POA: Diagnosis not present

## 2020-03-15 LAB — POCT GLYCOSYLATED HEMOGLOBIN (HGB A1C): Hemoglobin A1C: 6.8 % — AB (ref 4.0–5.6)

## 2020-03-15 LAB — POCT GLUCOSE (DEVICE FOR HOME USE): POC Glucose: 152 mg/dl — AB (ref 70–99)

## 2020-03-15 NOTE — Patient Instructions (Addendum)
-   Try to take Metformin 1000 mg, one and half tablet at night , may increase to 2 tablets after 2 weeks if no side effects   -Continue Glyburide  5 mg, 2 tablets before breakfast and 2 tablets before supper     HOW TO TREAT LOW BLOOD SUGARS (Blood sugar LESS THAN 70 MG/DL)  Please follow the RULE OF 15 for the treatment of hypoglycemia treatment (when your (blood sugars are less than 70 mg/dL)    STEP 1: Take 15 grams of carbohydrates when your blood sugar is low, which includes:   3-4 GLUCOSE TABS  OR  3-4 OZ OF JUICE OR REGULAR SODA OR  ONE TUBE OF GLUCOSE GEL     STEP 2: RECHECK blood sugar in 15 MINUTES STEP 3: If your blood sugar is still low at the 15 minute recheck --> then, go back to STEP 1 and treat AGAIN with another 15 grams of carbohydrates.

## 2020-04-29 ENCOUNTER — Encounter: Payer: Self-pay | Admitting: Internal Medicine

## 2020-04-29 DIAGNOSIS — Z3169 Encounter for other general counseling and advice on procreation: Secondary | ICD-10-CM | POA: Diagnosis not present

## 2020-04-30 ENCOUNTER — Other Ambulatory Visit: Payer: Self-pay | Admitting: Internal Medicine

## 2020-04-30 MED ORDER — LANTUS SOLOSTAR 100 UNIT/ML ~~LOC~~ SOPN: 20.0000 [IU] | PEN_INJECTOR | Freq: Every day | SUBCUTANEOUS | 3 refills | Status: DC

## 2020-04-30 MED ORDER — INSULIN PEN NEEDLE 31G X 5 MM MISC: 1.0000 | 3 refills | Status: DC

## 2020-04-30 MED ORDER — INSULIN LISPRO (1 UNIT DIAL) 100 UNIT/ML (KWIKPEN): 6.0000 [IU] | PEN_INJECTOR | Freq: Three times a day (TID) | SUBCUTANEOUS | 3 refills | Status: DC

## 2020-04-30 MED ORDER — FREESTYLE LIBRE 2 SENSOR MISC: 1.0000 | 3 refills | Status: DC

## 2020-05-07 ENCOUNTER — Other Ambulatory Visit: Payer: Self-pay

## 2020-05-07 DIAGNOSIS — F411 Generalized anxiety disorder: Secondary | ICD-10-CM

## 2020-05-07 MED ORDER — CITALOPRAM HYDROBROMIDE 40 MG PO TABS: 40.0000 mg | ORAL_TABLET | Freq: Every day | ORAL | 0 refills | Status: DC

## 2020-05-09 MED ORDER — FREESTYLE LIBRE 2 SENSOR MISC
1.0000 | 3 refills | Status: DC
Start: 2020-05-09 — End: 2020-05-15

## 2020-05-09 MED ORDER — INSULIN LISPRO (1 UNIT DIAL) 100 UNIT/ML (KWIKPEN): 6.0000 [IU] | PEN_INJECTOR | Freq: Three times a day (TID) | SUBCUTANEOUS | 1 refills | Status: DC

## 2020-05-09 MED ORDER — INSULIN PEN NEEDLE 31G X 5 MM MISC
1.0000 | 3 refills | Status: DC
Start: 2020-05-09 — End: 2021-01-24

## 2020-05-09 MED ORDER — LANTUS SOLOSTAR 100 UNIT/ML ~~LOC~~ SOPN: 20.0000 [IU] | PEN_INJECTOR | Freq: Every day | SUBCUTANEOUS | 1 refills | Status: DC

## 2020-05-15 ENCOUNTER — Telehealth: Payer: Self-pay | Admitting: Internal Medicine

## 2020-05-15 MED ORDER — LANTUS SOLOSTAR 100 UNIT/ML ~~LOC~~ SOPN: 20.0000 [IU] | PEN_INJECTOR | Freq: Every day | SUBCUTANEOUS | 1 refills | Status: DC

## 2020-05-15 MED ORDER — INSULIN LISPRO (1 UNIT DIAL) 100 UNIT/ML (KWIKPEN): 6.0000 [IU] | PEN_INJECTOR | Freq: Three times a day (TID) | SUBCUTANEOUS | 1 refills | Status: DC

## 2020-05-15 MED ORDER — FREESTYLE LIBRE 2 SENSOR MISC
1.0000 | 3 refills | Status: DC
Start: 2020-05-15 — End: 2021-03-13

## 2020-05-15 NOTE — Telephone Encounter (Signed)
Received fax request for refill for patient. According to the MyChart message and fax request, need to resent Rx for Lantus solostar pen, Freestyle Libre 2 sensor, and Humalog kwikpen.  Will resent as requested.

## 2020-05-23 DIAGNOSIS — R030 Elevated blood-pressure reading, without diagnosis of hypertension: Secondary | ICD-10-CM | POA: Diagnosis not present

## 2020-05-23 DIAGNOSIS — Z136 Encounter for screening for cardiovascular disorders: Secondary | ICD-10-CM | POA: Diagnosis not present

## 2020-05-23 DIAGNOSIS — Z131 Encounter for screening for diabetes mellitus: Secondary | ICD-10-CM | POA: Diagnosis not present

## 2020-05-23 DIAGNOSIS — Z713 Dietary counseling and surveillance: Secondary | ICD-10-CM | POA: Diagnosis not present

## 2020-05-23 DIAGNOSIS — Z0130 Encounter for examination of blood pressure without abnormal findings: Secondary | ICD-10-CM | POA: Diagnosis not present

## 2020-05-23 DIAGNOSIS — Z0000 Encounter for general adult medical examination without abnormal findings: Secondary | ICD-10-CM | POA: Diagnosis not present

## 2020-05-23 DIAGNOSIS — E119 Type 2 diabetes mellitus without complications: Secondary | ICD-10-CM | POA: Diagnosis not present

## 2020-05-23 DIAGNOSIS — Z13220 Encounter for screening for lipoid disorders: Secondary | ICD-10-CM | POA: Diagnosis not present

## 2020-05-27 ENCOUNTER — Encounter: Payer: Self-pay | Admitting: Internal Medicine

## 2020-05-27 MED ORDER — LANTUS SOLOSTAR 100 UNIT/ML ~~LOC~~ SOPN
24.0000 [IU] | PEN_INJECTOR | Freq: Every day | SUBCUTANEOUS | 1 refills | Status: DC
Start: 2020-05-27 — End: 2020-07-23

## 2020-05-27 MED ORDER — INSULIN LISPRO (1 UNIT DIAL) 100 UNIT/ML (KWIKPEN)
10.0000 [IU] | PEN_INJECTOR | Freq: Three times a day (TID) | SUBCUTANEOUS | 3 refills | Status: DC
Start: 2020-05-27 — End: 2020-07-23

## 2020-07-16 ENCOUNTER — Other Ambulatory Visit: Payer: Self-pay

## 2020-07-18 ENCOUNTER — Other Ambulatory Visit: Payer: Self-pay

## 2020-07-18 ENCOUNTER — Ambulatory Visit (INDEPENDENT_AMBULATORY_CARE_PROVIDER_SITE_OTHER): Payer: BC Managed Care – PPO | Admitting: Internal Medicine

## 2020-07-18 ENCOUNTER — Encounter: Payer: Self-pay | Admitting: Internal Medicine

## 2020-07-18 VITALS — BP 130/88 | HR 82 | Ht 65.0 in | Wt 194.5 lb

## 2020-07-18 DIAGNOSIS — E119 Type 2 diabetes mellitus without complications: Secondary | ICD-10-CM

## 2020-07-18 LAB — POCT GLYCOSYLATED HEMOGLOBIN (HGB A1C): Hemoglobin A1C: 7.7 % — AB (ref 4.0–5.6)

## 2020-07-18 NOTE — Patient Instructions (Addendum)
-   Metformin 1000 mg, 1 tablet twice a day  - Increase Lantus to 32 units daily  - Increase Humalog to 16 units with each meal    -Humalog correctional insulin: ADD extra units on insulin to your meal-time Humalog dose if your blood sugars are higher than 150. Use the scale below to help guide you:   Blood sugar before meal Number of units to inject  Less than 150 0 unit  151 -  170 1 units  171 -  190 2 units  191 -  210 3 units  211 -  230 4 units  231 -  250 5 units  251 -  270 6 units  271 -  290 7 units  291 -  310 8 units  311 - 330 9 units  331 - 350 10 units  351 - 370 11 units  371- 390 12 units         HOW TO TREAT LOW BLOOD SUGARS (Blood sugar LESS THAN 70 MG/DL)  Please follow the RULE OF 15 for the treatment of hypoglycemia treatment (when your (blood sugars are less than 70 mg/dL)    STEP 1: Take 15 grams of carbohydrates when your blood sugar is low, which includes:   3-4 GLUCOSE TABS  OR  3-4 OZ OF JUICE OR REGULAR SODA OR  ONE TUBE OF GLUCOSE GEL     STEP 2: RECHECK blood sugar in 15 MINUTES STEP 3: If your blood sugar is still low at the 15 minute recheck --> then, go back to STEP 1 and treat AGAIN with another 15 grams of carbohydrates.

## 2020-07-18 NOTE — Progress Notes (Unsigned)
Name: Kimberly Diaz  Age/ Sex: 30 y.o., female   MRN/ DOB: [id number], Jan 19, 1991     PCP: Sheliah Hatch, MD   Reason for Endocrinology Evaluation: Type 2 Diabetes Mellitus  Initial Endocrine Consultative Visit: 09/06/2019    PATIENT IDENTIFIER: Kimberly Diaz is a 30 y.o. female with a past medical history of T2DM and PCOS. The patient has followed with Endocrinology clinic since 09/06/2019 for consultative assistance with management of her diabetes.  DIABETIC HISTORY:  [name] was diagnosed with DM in 2016, she has been on metformin since her diagnosis. She has required insulin during 3rd trimester in 2018. Her hemoglobin A1c has ranged from 5.8% in 2018, peaking at 9.0% in 2019.  On her initial visit to our clinic her A1c was 9.8% she was on metformin only, we started her on Glyburide and gradual titration of the dose.    Pt has a 2 yr old daughter at home, she is planning on conceiving in the near future SUBJECTIVE:   During the last visit (03/15/2020): A1c 7.0 % . We switched to MDI regimen and stopped Glyburide and Metformin     Today (07/18/2020): [name] is here for a follow up on diabetes.Since her last visit and during preconception counseling with MFM specialist she was advised to switched to MDI regimen, which we did.  She has not been checking recently. The patient has not  had hypoglycemic episodes since the last clinic visit.    HOME DIABETES REGIMEN:  Metformin 1000 mg, BID  Lantus 24 units daily - sometimes bumps up to 30 Humalog 12 units with each meal      CONTINUOUS GLUCOSE MONITORING RECORD INTERPRETATION    Dates of Recording: 07/05/2020-07/18/2020  Sensor description: freestyle libre   Results statistics:   CGM use % of time 45  Average and SD 250/30.3  Time in range     18   %  % Time Above 180 39  % Time above 250 43  % Time Below target 0     Glycemic patterns summary: hyperglycemia through the night and day, worse during  the day   Hyperglycemic episodes  Post prandial   Hypoglycemic episodes occurred N/A  Overnight periods: trends down but still high      DIABETIC COMPLICATIONS: Microvascular complications:    Denies: CKD, neuropathy   Last eye exam: Completed 03/2019  Macrovascular complications:    Denies: CAD, PVD, CVA   HISTORY:  Past Medical History:  Past Medical History:  Diagnosis Date  . Anxiety   . Chronic back pain    muscle stiffness  . Diabetes mellitus without complication (HCC)    metformin  . Fatty liver   . Hx of varicella   . PCOS (polycystic ovarian syndrome)    possible    Past Surgical History:  Past Surgical History:  Procedure Laterality Date  . CESAREAN SECTION N/A 11/06/2016   Procedure: CESAREAN SECTION;  Surgeon: Marlow Baars, MD;  Location: [facility] BIRTHING SUITES;  Service: Obstetrics;  Laterality: N/A;  . WISDOM TOOTH EXTRACTION  2012    Social History:  reports that she has never smoked. She has never used smokeless tobacco. She reports that she does not drink alcohol and does not use drugs. Family History:  Family History  Problem Relation Age of Onset  . Hypertension Father   . Diabetes Father   . Cancer Maternal Grandfather   . Heart disease Maternal Grandfather   . Cancer Paternal Grandfather   .  Breast cancer Paternal Grandmother      HOME MEDICATIONS: Allergies as of 07/18/2020   No Known Allergies     Medication List       Accurate as of July 18, 2020  9:39 AM. If you have any questions, ask your nurse or doctor.        citalopram 40 MG tablet Commonly known as: CELEXA Take 1 tablet (40 mg total) by mouth daily.   Estarylla 0.25-35 MG-MCG tablet Generic drug: norgestimate-ethinyl estradiol   FreeStyle Libre 2 Sensor Misc 1 Device by Does not apply route as directed.   glucose blood test strip Use as instructed to test blood sugar 2 times daily E11.9   insulin lispro 100 UNIT/ML KwikPen Commonly known as: HumaLOG  KwikPen Inject 10 Units into the skin 3 (three) times daily.   Insulin Pen Needle 31G X 5 MM Misc 1 Device by Does not apply route as directed.   Lantus SoloStar 100 UNIT/ML Solostar Pen Generic drug: insulin glargine Inject 24 Units into the skin daily.   melatonin 3 MG Tabs tablet Take by mouth.   metFORMIN 1000 MG tablet Commonly known as: GLUCOPHAGE Take 0.5 tablets (500 mg total) by mouth daily with breakfast AND 1 tablet (1,000 mg total) daily with supper.   ONE TOUCH COMBO PACK Misc by Does not apply route.   OneTouch Delica Plus Lancing Misc by Does not apply route.   PRE-NATAL PO Take by mouth.        OBJECTIVE:   Vital Signs: BP 130/88   Pulse 82   Ht 5\' 5"  (1.651 m)   Wt 194 lb 8 oz (88.2 kg)   SpO2 98%   BMI 32.37 kg/m   Wt Readings from Last 3 Encounters:  07/18/20 194 lb 8 oz (88.2 kg)  03/15/20 197 lb 3.2 oz (89.4 kg)  12/07/19 191 lb 3.2 oz (86.7 kg)     Exam: General: Pt appears well and is in NAD  Lungs: Clear with good BS bilat with no rales, rhonchi, or wheezes  Heart: RRR with normal S1 and S2 and no gallops; no murmurs; no rub  Abdomen: Normoactive bowel sounds, soft, nontender, without masses or organomegaly palpable  Extremities: No pretibial edema.   Neuro: MS is good with appropriate affect, pt is alert and Ox3         DM foot exam: 09/06/2019   The skin of the feet is intact without sores or ulcerations. The pedal pulses are 2+ on right and 2+ on left. The sensation is intact to a screening 5.07, 10 gram monofilament bilaterally    DATA REVIEWED:  Lab Results  Component Value Date   HGBA1C 7.7 (A) 07/18/2020   HGBA1C 6.8 (A) 03/15/2020   HGBA1C 7.0 (A) 12/07/2019   Lab Results  Component Value Date   MICROALBUR 33.2 (H) 05/31/2018   LDLCALC 105 (H) 06/09/2017   CREATININE 0.59 01/04/2019   Lab Results  Component Value Date   MICRALBCREAT 15.9 05/31/2018     Lab Results  Component Value Date   CHOL 151  01/04/2019   HDL 35.10 (L) 01/04/2019   LDLCALC 105 (H) 06/09/2017   LDLDIRECT 84.0 01/04/2019   TRIG 366.0 (H) 01/04/2019   CHOLHDL 4 01/04/2019         ASSESSMENT / PLAN / RECOMMENDATIONS:   1) Type 2 Diabetes Mellitus, Sub- Optimally controlled, Without complications - Most recent A1c of 7.7  %. Goal A1c < 7.0 %.     -  Pt with hyperglycemia , current regimen will need to be adjusted  - Pt will be referred to our RD for further discussion on diet. In review of her CGM download she has a BG reading of 133 mg.dL ate a snack with BG trending up to 300 mg/dL  - Will make the following adjustments  - She will be provided with a correction scale as well    MEDICATIONS: - Metformin 1000 mg,to 2 tabs at night  -  Increase Lantus to 32 units daily  - Increase Humalog to 16 units with each meal  - CF : Novolog ( BG -130/20)    EDUCATION / INSTRUCTIONS:  BG monitoring instructions: Patient is instructed to check her blood sugars 2 times a day, fasting and supper time.  Call [facility] if: BG persistently < 70 . I reviewed the Rule of 15 for the treatment of hypoglycemia in detail with the patient. Literature supplied.     2) Diabetic complications:   Eye: Does not have known diabetic retinopathy.   Neuro/ Feet: Does not have known diabetic peripheral neuropathy .   Renal: Patient does not have known baseline CKD.      F/U in 4 months    Signed electronically by: Lyndle Herrlich, MD  [facility] End[facility]  [facility] [address] [place], [place] [address] Phone: [phone] FAX: [phone]   CC: Sheliah Hatch, MD [address] [place] [address] Phone: [phone]  Fax: [phone]  Return to Endocrinology clinic as below: No future appointments.

## 2020-07-22 ENCOUNTER — Encounter: Payer: BC Managed Care – PPO | Attending: Internal Medicine | Admitting: Dietician

## 2020-07-22 ENCOUNTER — Encounter: Payer: Self-pay | Admitting: Dietician

## 2020-07-22 ENCOUNTER — Encounter: Payer: Self-pay | Admitting: Internal Medicine

## 2020-07-22 DIAGNOSIS — E119 Type 2 diabetes mellitus without complications: Secondary | ICD-10-CM | POA: Diagnosis not present

## 2020-07-22 NOTE — Patient Instructions (Signed)
Take Humalog 15 minutes before eating.    Taking Humalog after you eat does not effectively cover the meal and can more likely cause low blood glucose. Stay active Balance carbohydrates throughout the day to avoid increased snacking and overeating later in the day.  Aim for about 2-3 carbohydrate choices (30-45 grams) per meal. Eat slowly and stop when you are satisfied.

## 2020-07-22 NOTE — Progress Notes (Signed)
Primary concerns today: education to make healthier choices to improve blood glucose control prior to fertility treatment.   Diabetes Self-Management Education  Visit Type: First/Initial  Appt. Start Time: 1100 Appt. End Time: 1220  07/22/2020  Kimberly Diaz, identified by name and date of birth, is a 30 y.o. female with a diagnosis of Diabetes: Type 2.   ASSESSMENT Patient of Dr. Lonzo Cloud and fertility treatment in [facility] Referral diagnosis: Type 2 diabetes Preferred learning style: auditory  Learning readiness: ready  Patient lives with her husband and 71 yo daughter.  She does the shopping and cooking.  She is a stay at home mom.  NUTRITION ASSESSMENT   Anthropometrics  Wt Readings from Last 3 Encounters:  07/22/20 190 lb (86.2 kg)  07/18/20 194 lb 8 oz (88.2 kg)  03/15/20 197 lb 3.2 oz (89.4 kg)    Clinical Medical Hx: Type 2 Diabetes, PCOS Medications: Lantus 32 units q HS, Humalog 16 units before meals plus sliding scale, Metformin.  Glipizide was stopped due to concerns of hypoglycemia, Insulin started in October. Labs: A1C 7.7% 07/18/20 increased from 6.8% 03/15/2020  Lifestyle & Dietary Hx Avoids rice, pasta, potatoes, soda, rare red meat Height 5\' 5"  (1.651 m), weight 190 lb (86.2 kg). Body mass index is 31.62 kg/m.   Diabetes Self-Management Education - 07/22/20 1110      Visit Information   Visit Type First/Initial      Initial Visit   Diabetes Type Type 2    Are you currently following a meal plan? No    Are you taking your medications as prescribed? Yes    Date Diagnosed 2016      Health Coping   How would you rate your overall health? Fair      Psychosocial Assessment   Patient Belief/Attitude about Diabetes Motivated to manage diabetes   overhelmed   Self-care barriers None    Self-management support Doctor's office    Other persons present Patient    Patient Concerns Nutrition/Meal planning;Glycemic Control    Special Needs None     Preferred Learning Style No preference indicated    Learning Readiness Ready    How often do you need to have someone help you when you read instructions, pamphlets, or other written materials from your doctor or pharmacy? 1 - Never    What is the last grade level you completed in school? bs      Pre-Education Assessment   Patient understands the diabetes disease and treatment process. Needs Review    Patient understands incorporating nutritional management into lifestyle. Needs Review    Patient undertands incorporating physical activity into lifestyle. Needs Review    Patient understands using medications safely. Needs Review    Patient understands monitoring blood glucose, interpreting and using results Needs Review    Patient understands prevention, detection, and treatment of acute complications. Needs Review    Patient understands prevention, detection, and treatment of chronic complications. Needs Review    Patient understands how to develop strategies to address psychosocial issues. Needs Review    Patient understands how to develop strategies to promote health/change behavior. Needs Review      Complications   Last HgB A1C per patient/outside source 7.7 %   07/18/2020 increased from 6.8% 03/15/2020   How often do you check your blood sugar? > 4 times/day    Fasting Blood glucose range (mg/dL) 05/15/2020    Postprandial Blood glucose range (mg/dL) 44-010    Number of hypoglycemic episodes per month  0    Number of hyperglycemic episodes per week 14    Have you had a dilated eye exam in the past 12 months? No   Lasix surgery in 2020   Have you had a dental exam in the past 12 months? Yes    Are you checking your feet? Yes    How many days per week are you checking your feet? 7      Dietary Intake   Breakfast low sugar oatmeal OR eggs, milk OR premier protein shake OR half bagel and cream cheese   8-8:30   Snack (morning) none    Snack (afternoon) nuts or cheese, nuts, dried cranberry  pack or airpopped popcorn or cheese stick    Dinner chicken, non starchy vegetables, occasional wrap or pita    Snack (evening) none    Beverage(s) water, half and half sweet tea      Exercise   Exercise Type Light (walking / raking leaves)   walks or beach body video 3-4 days per week for 30-45 minutes   How many days per week to you exercise? 4    How many minutes per day do you exercise? 40    Total minutes per week of exercise 160      Patient Education   Previous Diabetes Education Yes (please comment)   2017   Disease state  Definition of diabetes, type 1 and 2, and the diagnosis of diabetes    Nutrition management  Role of diet in the treatment of diabetes and the relationship between the three main macronutrients and blood glucose level;Food label reading, portion sizes and measuring food.;Meal options for control of blood glucose level and chronic complications.;Meal timing in regards to the patients' current diabetes medication.;Carbohydrate counting    Physical activity and exercise  Role of exercise on diabetes management, blood pressure control and cardiac health.    Medications Taught/reviewed insulin injection, site rotation, insulin storage and needle disposal.;Reviewed patients medication for diabetes, action, purpose, timing of dose and side effects.    Monitoring Taught/discussed recording of test results and interpretation of SMBG.;Identified appropriate SMBG and/or A1C goals.;Daily foot exams;Yearly dilated eye exam    Acute complications Taught treatment of hypoglycemia - the 15 rule.;Discussed and identified patients' treatment of hyperglycemia.    Chronic complications Retinopathy and reason for yearly dilated eye exams    Psychosocial adjustment Role of stress on diabetes;Worked with patient to identify barriers to care and solutions    Preconception care Reviewed with patient blood glucose goals with pregnancy;Role of family planning for patients with diabetes       Individualized Goals (developed by patient)   Nutrition General guidelines for healthy choices and portions discussed    Physical Activity Exercise 5-7 days per week;30 minutes per day    Medications take my medication as prescribed    Monitoring  test my blood glucose as discussed    Reducing Risk examine blood glucose patterns;increase portions of healthy fats      Post-Education Assessment   Patient understands the diabetes disease and treatment process. Demonstrates understanding / competency    Patient understands incorporating nutritional management into lifestyle. Demonstrates understanding / competency    Patient undertands incorporating physical activity into lifestyle. Demonstrates understanding / competency    Patient understands using medications safely. Demonstrates understanding / competency    Patient understands monitoring blood glucose, interpreting and using results Demonstrates understanding / competency    Patient understands prevention, detection, and treatment of acute complications. Demonstrates understanding /  competency    Patient understands prevention, detection, and treatment of chronic complications. Demonstrates understanding / competency    Patient understands how to develop strategies to address psychosocial issues. Demonstrates understanding / competency    Patient understands how to develop strategies to promote health/change behavior. Demonstrates understanding / competency      Outcomes   Expected Outcomes Demonstrated interest in learning. Expect positive outcomes    Future DMSE PRN    Program Status Completed           Individualized Plan for Diabetes Self-Management Training:   Learning Objective:  Patient will have a greater understanding of diabetes self-management. Patient education plan is to attend individual and/or group sessions per assessed needs and concerns.   Plan:   Patient Instructions  Take Humalog 15 minutes before eating.     Taking Humalog after you eat does not effectively cover the meal and can more likely cause low blood glucose. Stay active Balance carbohydrates throughout the day to avoid increased snacking and overeating later in the day.  Aim for about 2-3 carbohydrate choices (30-45 grams) per meal. Eat slowly and stop when you are satisfied.     Expected Outcomes:  Demonstrated interest in learning. Expect positive outcomes  Education material provided: ADA - How to Thrive: A Guide for Your Journey with Diabetes, Meal plan card, Snack sheet and Diabetes Resources, PCOS tips, Inositol information page  If problems or questions, patient to contact team via:  Phone  Future DSME appointment: PRN

## 2020-07-23 MED ORDER — HUMALOG KWIKPEN 200 UNIT/ML ~~LOC~~ SOPN: PEN_INJECTOR | SUBCUTANEOUS | 3 refills | Status: DC

## 2020-07-23 MED ORDER — INSULIN GLARGINE-YFGN 100 UNIT/ML ~~LOC~~ SOLN: 32.0000 [IU] | Freq: Every day | SUBCUTANEOUS | 3 refills | Status: DC

## 2020-07-23 NOTE — Telephone Encounter (Signed)
Please advise 

## 2020-07-26 ENCOUNTER — Telehealth: Payer: Self-pay | Admitting: Internal Medicine

## 2020-07-26 MED ORDER — HUMALOG KWIKPEN 200 UNIT/ML ~~LOC~~ SOPN
PEN_INJECTOR | SUBCUTANEOUS | 0 refills | Status: DC
Start: 2020-07-26 — End: 2020-10-24

## 2020-07-26 MED ORDER — INSULIN GLARGINE-YFGN 100 UNIT/ML ~~LOC~~ SOLN
32.0000 [IU] | Freq: Every day | SUBCUTANEOUS | 0 refills | Status: DC
Start: 2020-07-26 — End: 2020-10-24

## 2020-07-26 NOTE — Telephone Encounter (Signed)
[facility] called to clarify if RX for Insulin Glargine-yfgn (SEMGLEE, YFGN was for Vials or Pens?

## 2020-07-26 NOTE — Telephone Encounter (Signed)
Spoken to [facility] and inform them that Rx is for pens

## 2020-08-01 ENCOUNTER — Encounter: Payer: Self-pay | Admitting: Family Medicine

## 2020-08-05 ENCOUNTER — Other Ambulatory Visit: Payer: Self-pay | Admitting: Family Medicine

## 2020-08-05 ENCOUNTER — Encounter: Payer: Self-pay | Admitting: Emergency Medicine

## 2020-08-05 DIAGNOSIS — F411 Generalized anxiety disorder: Secondary | ICD-10-CM

## 2020-08-05 NOTE — Telephone Encounter (Signed)
My chart message sent to patient advising she is over due for an anxiety follow up.

## 2020-08-08 ENCOUNTER — Telehealth (INDEPENDENT_AMBULATORY_CARE_PROVIDER_SITE_OTHER): Payer: BC Managed Care – PPO | Admitting: Family Medicine

## 2020-08-08 DIAGNOSIS — U071 COVID-19: Secondary | ICD-10-CM | POA: Diagnosis not present

## 2020-08-08 DIAGNOSIS — F411 Generalized anxiety disorder: Secondary | ICD-10-CM | POA: Diagnosis not present

## 2020-08-08 MED ORDER — CITALOPRAM HYDROBROMIDE 40 MG PO TABS: 40.0000 mg | ORAL_TABLET | Freq: Every day | ORAL | 0 refills | Status: DC

## 2020-08-08 MED ORDER — BENZONATATE 100 MG PO CAPS: 100.0000 mg | ORAL_CAPSULE | Freq: Three times a day (TID) | ORAL | 0 refills | Status: DC | PRN

## 2020-08-08 NOTE — Patient Instructions (Addendum)
  HOME CARE TIPS:  -Schedule a follow-up visit with your primary care doctor regarding the anxiety.  -I sent the medication(s) we discussed to your pharmacy: Meds ordered this encounter  Medications  . benzonatate (TESSALON PERLES) 100 MG capsule    Sig: Take 1 capsule (100 mg total) by mouth 3 (three) times daily as needed.    Dispense:  20 capsule    Refill:  0     -can use tylenol or aleve if needed for fevers, aches and pains per instructions  -can use nasal saline a few times per day if you have nasal congestion; sometimes  a short course of Afrin nasal spray for 3 days can help with symptoms as well  -Warm herbal tea with lemon and gargling salt water can sometimes help as well with the sore throat  -stay hydrated, drink plenty of fluids and eat small healthy meals - avoid dairy  -can take 1000 IU ( ) Vit D3 and 100-500 mg of Vit C daily per instructions  -If the Covid test is positive, check out the CDC website for more information on home care, transmission and treatment for COVID19  -follow up with your doctor in 2-3 days unless improving and feeling better  -stay home while sick, except to seek medical care, and if you have COVID19 ideally it would be best to stay home for a full 10 days since the onset of symptoms PLUS one day of no fever and feeling better. Wear a good mask (such as N95 or KN95) if around others to reduce the risk of transmission.  It was nice to meet you today, and I really hope you are feeling better soon. I help [facility] out with telemedicine visits on Tuesdays and Thursdays and am available for visits on those days. If you have any concerns or questions following this visit please schedule a follow up visit with your Primary Care doctor or seek care at a local urgent care clinic to avoid delays in care.    Seek in person care or schedule a follow up video visit promptly if your symptoms worsen, new concerns arise or you are not improving with  treatment. Call 911 and/or seek emergency care if your symptoms are severe or life threatening.

## 2020-08-08 NOTE — Progress Notes (Signed)
Virtual Visit via Video Note  I connected with Alicia  on 08/08/20 at 11:00 AM EST by a video enabled telemedicine application and verified that I am speaking with the correct person using two identifiers.  Location patient: home, [place] Location provider:work or home office Persons participating in the virtual visit: patient, provider  I discussed the limitations of evaluation and management by telemedicine and the availability of in person appointments. The patient expressed understanding and agreed to proceed.   HPI:  Acute telemedicine visit for COVID19: -Onset:  08/05/20 -husband had covid last week, daughter had covid over the weekend -Symptoms include:fever the first few days, body aches, nasal congestion, sore throat, cough -doing better with fevers and body aches resolved, but still with nasal congestion, sore throat, pnd, cough -Denies: fever today, CP, SOB, NVD, inability to get out of bed/eat/drink -Pertinent past medical history: diabetes - reports well controlled, overweight -Pertinent medication allergies: nkda -COVID-19 vaccine status: fully vaccinated with 2 doses, has not had flu shot -FDLMP: a few weeks ago, on OCP  Out of refills on citalopram for anxiety. Reports has been stable. Mood has been good on this medication.   ROS: See pertinent positives and negatives per HPI.  Past Medical History:  Diagnosis Date  . Anxiety   . Chronic back pain    muscle stiffness  . Diabetes mellitus without complication (HCC)    metformin  . Fatty liver   . Hx of varicella   . PCOS (polycystic ovarian syndrome)    possible     Past Surgical History:  Procedure Laterality Date  . CESAREAN SECTION N/A 11/06/2016   Procedure: CESAREAN SECTION;  Surgeon: Marlow Baars, MD;  Location: [facility] BIRTHING SUITES;  Service: Obstetrics;  Laterality: N/A;  . WISDOM TOOTH EXTRACTION  2012     Current Outpatient Medications:  .  benzonatate (TESSALON PERLES) 100 MG capsule, Take 1 capsule  (100 mg total) by mouth 3 (three) times daily as needed., Disp: 20 capsule, Rfl: 0 .  cholecalciferol (VITAMIN D3) 25 MCG (1000 UNIT) tablet, Take 3,000 Units by mouth daily., Disp: , Rfl:  .  citalopram (CELEXA) 40 MG tablet, Take 1 tablet (40 mg total) by mouth daily., Disp: 90 tablet, Rfl: 0 .  Continuous Blood Gluc Sensor (FREESTYLE LIBRE 2 SENSOR) MISC, 1 Device by Does not apply route as directed., Disp: 6 each, Rfl: 3 .  ESTARYLLA 0.25-35 MG-MCG tablet, , Disp: , Rfl:  .  glucose blood test strip, Use as instructed to test blood sugar 2 times daily E11.9, Disp: 100 each, Rfl: 12 .  Insulin Glargine-yfgn (SEMGLEE, YFGN,) 100 UNIT/ML SOLN, Inject 32 Units into the skin daily., Disp: 30 mL, Rfl: 0 .  insulin lispro (HUMALOG KWIKPEN) 200 UNIT/ML KwikPen, Max daily 90 units, Disp: 45 mL, Rfl: 0 .  Insulin Pen Needle 31G X 5 MM MISC, 1 Device by Does not apply route as directed., Disp: 400 each, Rfl: 3 .  Lancet Devices [facility] DELICA PLUS LANCING) MISC, by Does not apply route., Disp: , Rfl:  .  Lancets (ONE TOUCH COMBO PACK) MISC, by Does not apply route., Disp: , Rfl:  .  Melatonin 3 MG TABS, Take by mouth. (Patient not taking: Reported on 07/22/2020), Disp: , Rfl:  .  metFORMIN (GLUCOPHAGE) 1000 MG tablet, Take 0.5 tablets (500 mg total) by mouth daily with breakfast AND 1 tablet (1,000 mg total) daily with supper., Disp: 135 tablet, Rfl: 3 .  Prenatal Multivit-Min-Fe-FA (PRE-NATAL PO), Take by mouth., Disp: ,  Rfl:   EXAM:  VITALS per patient if applicable:  GENERAL: alert, oriented, appears well and in no acute distress  HEENT: atraumatic, conjunttiva clear, no obvious abnormalities on inspection of external nose and ears  NECK: normal movements of the head and neck  LUNGS: on inspection no signs of respiratory distress, breathing rate appears normal, no obvious gross SOB, gasping or wheezing  CV: no obvious cyanosis  MS: moves all visible extremities without noticeable  abnormality  PSYCH/NEURO: pleasant and cooperative, no obvious depression or anxiety, speech and thought processing grossly intact  ASSESSMENT AND PLAN:  Discussed the following assessment and plan:  COVID-19  Anxiety state - Plan: citalopram (CELEXA) 40 MG tablet  -we discussed possible serious and likely etiologies, options for evaluation and workup, limitations of telemedicine visit vs in person visit, treatment, treatment risks and precautions. Pt prefers to treat via telemedicine empirically rather than in person at this moment.   Discussed treatment options, ideal treatment window, potential complications, isolation and precautions for COVID-19.  She declined referral for Covid outpatient treatment at this time.  She did want a prescription for cough, Tessalon Rx sent.  Other symptomatic care measures summarized in patient instructions. Also sent refill of her Celexa as requested.  Did advise that she schedule follow-up with her primary care doctor when she is feeling better in the next 90 days, prior to her next refill. Work/School slipped offered: declined Scheduled follow up with PCP offered: She agrees to schedule follow-up. Advised to seek prompt in person care if worsening, new symptoms arise, or if is not improving with treatment. Discussed options for inperson care if PCP office not available. Did let this patient know that I only do telemedicine on Tuesdays and Thursdays for [facility]. Advised to schedule follow up visit with PCP or [facility] if any further questions or concerns to avoid delays in care.   I discussed the assessment and treatment plan with the patient. The patient was provided an opportunity to ask questions and all were answered. The patient agreed with the plan and demonstrated an understanding of the instructions.     Terressa Koyanagi, DO

## 2020-08-28 ENCOUNTER — Ambulatory Visit: Payer: BC Managed Care – PPO | Admitting: Internal Medicine

## 2020-10-15 DIAGNOSIS — D235 Other benign neoplasm of skin of trunk: Secondary | ICD-10-CM | POA: Diagnosis not present

## 2020-10-15 DIAGNOSIS — D2262 Melanocytic nevi of left upper limb, including shoulder: Secondary | ICD-10-CM | POA: Diagnosis not present

## 2020-10-15 DIAGNOSIS — L858 Other specified epidermal thickening: Secondary | ICD-10-CM | POA: Diagnosis not present

## 2020-10-15 DIAGNOSIS — L570 Actinic keratosis: Secondary | ICD-10-CM | POA: Diagnosis not present

## 2020-10-15 DIAGNOSIS — D225 Melanocytic nevi of trunk: Secondary | ICD-10-CM | POA: Diagnosis not present

## 2020-10-17 ENCOUNTER — Ambulatory Visit: Payer: BC Managed Care – PPO | Admitting: Internal Medicine

## 2020-10-24 ENCOUNTER — Ambulatory Visit (INDEPENDENT_AMBULATORY_CARE_PROVIDER_SITE_OTHER): Payer: BC Managed Care – PPO | Admitting: Internal Medicine

## 2020-10-24 ENCOUNTER — Other Ambulatory Visit: Payer: Self-pay

## 2020-10-24 ENCOUNTER — Encounter: Payer: Self-pay | Admitting: Internal Medicine

## 2020-10-24 VITALS — BP 134/80 | HR 82 | Ht 65.0 in | Wt 200.2 lb

## 2020-10-24 DIAGNOSIS — E119 Type 2 diabetes mellitus without complications: Secondary | ICD-10-CM | POA: Diagnosis not present

## 2020-10-24 LAB — POCT GLYCOSYLATED HEMOGLOBIN (HGB A1C): Hemoglobin A1C: 8 % — AB (ref 4.0–5.6)

## 2020-10-24 MED ORDER — METFORMIN HCL ER (MOD) 1000 MG PO TB24: 1000.0000 mg | ORAL_TABLET | Freq: Every day | ORAL | 3 refills | Status: DC

## 2020-10-24 MED ORDER — HUMALOG KWIKPEN 200 UNIT/ML ~~LOC~~ SOPN: PEN_INJECTOR | SUBCUTANEOUS | 0 refills | Status: DC

## 2020-10-24 MED ORDER — DEXCOM G6 SENSOR MISC: 1.0000 | 3 refills | Status: DC

## 2020-10-24 MED ORDER — INSULIN GLARGINE-YFGN 100 UNIT/ML ~~LOC~~ SOLN: 45.0000 [IU] | Freq: Every day | SUBCUTANEOUS | 3 refills | Status: DC

## 2020-10-24 MED ORDER — DEXCOM G6 TRANSMITTER MISC: 1.0000 | 3 refills | Status: DC

## 2020-10-24 NOTE — Progress Notes (Signed)
Name: Kimberly Diaz  Age/ Sex: 30 y.o., female   MRN/ DOB: [id number], 03/04/1991     PCP: Sheliah Hatch, MD   Reason for Endocrinology Evaluation: Type 2 Diabetes Mellitus  Initial Endocrine Consultative Visit: 09/06/2019    PATIENT IDENTIFIER: [name] is a 30 y.o. female with a past medical history of T2DM and PCOS. The patient has followed with Endocrinology clinic since 09/06/2019 for consultative assistance with management of her diabetes.  DIABETIC HISTORY:  Ms. Pringle was diagnosed with DM in 2016, she has been on metformin since her diagnosis. She has required insulin during 3rd trimester in 2018. Her hemoglobin A1c has ranged from 5.8% in 2018, peaking at 9.0% in 2019.  On her initial visit to our clinic her A1c was 9.8% she was on metformin only, we started her on Glyburide and gradual titration of the dose.    Pt has a 2 yr old daughter at home, she is planning on conceiving in the near future SUBJECTIVE:   During the last visit (07/19/2019): A1c 7.7 % . We continued metformin and adjusted MDI regimen   Today (10/24/2020): [name] is here for a follow up on diabetes..  She has been checking recently. The patient has not  had hypoglycemic episodes since the last clinic visit.   Had to decrease metformin due to GI side effects  Had a viral gastritis last week    HOME DIABETES REGIMEN:  Metformin 1000 mg, 1 tab at night  Lantus 32  units daily  Humalog 16 units with each meal  CF: Novolog (BG-130/20)      CONTINUOUS GLUCOSE MONITORING RECORD INTERPRETATION    Dates of Recording: 4/8-4/21/2022  Sensor description: freestyle libre   Results statistics:   CGM use % of time 51  Average and SD 260/24.5  Time in range    8 %  % Time Above 180 40  % Time above 250 52  % Time Below target 0     Glycemic patterns summary: hyperglycemia through the night and day, worse during the day   Hyperglycemic episodes  Post prandial   Hypoglycemic  episodes occurred N/A  Overnight periods: trends down but still high      DIABETIC COMPLICATIONS: Microvascular complications:    Denies: CKD, neuropathy   Last eye exam: Completed 03/2019  Macrovascular complications:    Denies: CAD, PVD, CVA   HISTORY:  Past Medical History:  Past Medical History:  Diagnosis Date  . Anxiety   . Chronic back pain    muscle stiffness  . Diabetes mellitus without complication (HCC)    metformin  . Fatty liver   . Hx of varicella   . PCOS (polycystic ovarian syndrome)    possible    Past Surgical History:  Past Surgical History:  Procedure Laterality Date  . CESAREAN SECTION N/A 11/06/2016   Procedure: CESAREAN SECTION;  Surgeon: Marlow Baars, MD;  Location: [facility] BIRTHING SUITES;  Service: Obstetrics;  Laterality: N/A;  . WISDOM TOOTH EXTRACTION  2012    Social History:  reports that she has never smoked. She has never used smokeless tobacco. She reports that she does not drink alcohol and does not use drugs. Family History:  Family History  Problem Relation Age of Onset  . Hypertension Father   . Diabetes Father   . Cancer Maternal Grandfather   . Heart disease Maternal Grandfather   . Cancer Paternal Grandfather   . Breast cancer Paternal Grandmother  HOME MEDICATIONS: Allergies as of 10/24/2020   No Known Allergies     Medication List       Accurate as of October 24, 2020  9:46 AM. If you have any questions, ask your nurse or doctor.        STOP taking these medications   Estarylla 0.25-35 MG-MCG tablet Generic drug: norgestimate-ethinyl estradiol Stopped by: Scarlette Shorts, MD     TAKE these medications   benzonatate 100 MG capsule Commonly known as: Tessalon Perles Take 1 capsule (100 mg total) by mouth 3 (three) times daily as needed.   cholecalciferol 25 MCG (1000 UNIT) tablet Commonly known as: VITAMIN D3 Take 3,000 Units by mouth daily.   citalopram 40 MG tablet Commonly known as:  CELEXA Take 1 tablet (40 mg total) by mouth daily.   FreeStyle Libre 2 Sensor Misc 1 Device by Does not apply route as directed.   glucose blood test strip Use as instructed to test blood sugar 2 times daily E11.9   HumaLOG KwikPen 200 UNIT/ML KwikPen Generic drug: insulin lispro Max daily 90 units   Insulin Glargine-yfgn 100 UNIT/ML Soln Commonly known as: Semglee (yfgn) Inject 32 Units into the skin daily.   Insulin Pen Needle 31G X 5 MM Misc 1 Device by Does not apply route as directed.   melatonin 3 MG Tabs tablet Take by mouth.   metFORMIN 1000 MG tablet Commonly known as: GLUCOPHAGE Take 0.5 tablets (500 mg total) by mouth daily with breakfast AND 1 tablet (1,000 mg total) daily with supper.   ONE TOUCH COMBO PACK Misc by Does not apply route.   OneTouch Delica Plus Lancing Misc by Does not apply route.   PRE-NATAL PO Take by mouth.        OBJECTIVE:   Vital Signs: BP 134/80   Pulse 82   Ht 5\' 5"  (1.651 m)   Wt 200 lb 4 oz (90.8 kg)   SpO2 99%   BMI 33.32 kg/m   Wt Readings from Last 3 Encounters:  10/24/20 200 lb 4 oz (90.8 kg)  07/22/20 190 lb (86.2 kg)  07/18/20 194 lb 8 oz (88.2 kg)     Exam: General: Pt appears well and is in NAD  Lungs: Clear with good BS bilat with no rales, rhonchi, or wheezes  Heart: RRR with normal S1 and S2 and no gallops; no murmurs; no rub  Abdomen: Normoactive bowel sounds, soft, nontender, without masses or organomegaly palpable  Extremities: No pretibial edema.   Neuro: MS is good with appropriate affect, pt is alert and Ox3         DM foot exam: 09/06/2019   The skin of the feet is intact without sores or ulcerations. The pedal pulses are 2+ on right and 2+ on left. The sensation is intact to a screening 5.07, 10 gram monofilament bilaterally    DATA REVIEWED:  Lab Results  Component Value Date   HGBA1C 8.0 (A) 10/24/2020   HGBA1C 7.7 (A) 07/18/2020   HGBA1C 6.8 (A) 03/15/2020   Lab Results   Component Value Date   MICROALBUR 33.2 (H) 05/31/2018   LDLCALC 105 (H) 06/09/2017   CREATININE 0.59 01/04/2019   Lab Results  Component Value Date   MICRALBCREAT 15.9 05/31/2018     Lab Results  Component Value Date   CHOL 151 01/04/2019   HDL 35.10 (L) 01/04/2019   LDLCALC 105 (H) 06/09/2017   LDLDIRECT 84.0 01/04/2019   TRIG 366.0 (H) 01/04/2019   CHOLHDL  4 01/04/2019         ASSESSMENT / PLAN / RECOMMENDATIONS:   1) Type 2 Diabetes Mellitus, Poorly  controlled, Without complications - Most recent A1c of 8.0  %. Goal A1c < 7.0 %.     - Pt with insulin resistance and hyperglycemia, will adjust the current regimen, we again emphasized the importance of low carb and low fat deit - We discussed insulin pump option, she will look in to this - I will try prescribing the dexcom , as the sensors to the freestyle have not been attaching as well  - Will switch metformin to XR and she will take two of those in the evening, she has not been able to tolerate it during the day    MEDICATIONS: - Increase Metformin 1000 mg XR to  2 tabs at night  - Increase Semglee to 45 units daily  - Increase Humalog to 20 units with each meal  - CF : Novolog ( BG -130/15)    EDUCATION / INSTRUCTIONS:  BG monitoring instructions: Patient is instructed to check her blood sugars 2 times a day, fasting and supper time.  Call [facility] if: BG persistently < 70 . I reviewed the Rule of 15 for the treatment of hypoglycemia in detail with the patient. Literature supplied.     2) Diabetic complications:   Eye: Does not have known diabetic retinopathy.   Neuro/ Feet: Does not have known diabetic peripheral neuropathy .   Renal: Patient does not have known baseline CKD.      F/U in 3 months    Signed electronically by: Lyndle Herrlich, MD  [facility]  [facility] [address] [place] [place], [place] [address] Phone:  [phone] FAX: [phone]   CC: Sheliah Hatch, MD [address] [place] [address] Phone: [phone]  Fax: [phone]  Return to Endocrinology clinic as below: No future appointments.

## 2020-10-24 NOTE — Patient Instructions (Addendum)
-   Metformin 1000 mg, 2 tablets a day - Increase Semglee to 45  units daily  - Increase Humalog to 20 units with each meal    -Humalog correctional insulin: ADD extra units on insulin to your meal-time Humalog dose if your blood sugars are higher than 145. Use the scale below to help guide you:   Blood sugar before meal Number of units to inject  Less than 145 0 unit  156-  160 1 units  161 -  175 2 units  176 -  190 3 units  191 -  205 4 units  206 - 220 5 units  221 -  235 6 units  236 -  250 7 units  251 -  265 8 units  266 - 280 9 units  281 - 295 10 units  296 - 310 11 units  311 - 325  12 units     - Please check out the Omnipod    HOW TO TREAT LOW BLOOD SUGARS (Blood sugar LESS THAN 70 MG/DL)  Please follow the RULE OF 15 for the treatment of hypoglycemia treatment (when your (blood sugars are less than 70 mg/dL)    STEP 1: Take 15 grams of carbohydrates when your blood sugar is low, which includes:   3-4 GLUCOSE TABS  OR  3-4 OZ OF JUICE OR REGULAR SODA OR  ONE TUBE OF GLUCOSE GEL     STEP 2: RECHECK blood sugar in 15 MINUTES STEP 3: If your blood sugar is still low at the 15 minute recheck --> then, go back to STEP 1 and treat AGAIN with another 15 grams of carbohydrates.

## 2020-10-30 ENCOUNTER — Telehealth: Payer: Self-pay | Admitting: Internal Medicine

## 2020-10-30 MED ORDER — METFORMIN HCL ER 750 MG PO TB24: 1500.0000 mg | ORAL_TABLET | Freq: Every day | ORAL | 3 refills | Status: DC

## 2020-10-30 NOTE — Telephone Encounter (Signed)
Message left for patient to return my call. Also sent a message on MyChart 

## 2020-10-30 NOTE — Telephone Encounter (Signed)
Received a call from [facility] that patient's insurance does not cover Metformin (MOD).  They will cover regular Metformin or Metformin XR.   Also is the patient suppose to take 1000 mg or 2000 mg.  Please Advise.

## 2020-11-12 ENCOUNTER — Other Ambulatory Visit: Payer: Self-pay

## 2020-11-12 ENCOUNTER — Ambulatory Visit (INDEPENDENT_AMBULATORY_CARE_PROVIDER_SITE_OTHER): Payer: BC Managed Care – PPO | Admitting: Family Medicine

## 2020-11-12 ENCOUNTER — Encounter: Payer: Self-pay | Admitting: Family Medicine

## 2020-11-12 VITALS — BP 126/70 | HR 94 | Temp 98.7°F | Resp 16 | Ht 65.0 in | Wt 208.3 lb

## 2020-11-12 DIAGNOSIS — E669 Obesity, unspecified: Secondary | ICD-10-CM

## 2020-11-12 DIAGNOSIS — E119 Type 2 diabetes mellitus without complications: Secondary | ICD-10-CM

## 2020-11-12 DIAGNOSIS — F411 Generalized anxiety disorder: Secondary | ICD-10-CM

## 2020-11-12 DIAGNOSIS — E781 Pure hyperglyceridemia: Secondary | ICD-10-CM

## 2020-11-12 LAB — HEPATIC FUNCTION PANEL
ALT: 82 U/L — ABNORMAL HIGH (ref 0–35)
AST: 71 U/L — ABNORMAL HIGH (ref 0–37)
Albumin: 4.5 g/dL (ref 3.5–5.2)
Alkaline Phosphatase: 60 U/L (ref 39–117)
Bilirubin, Direct: 0.1 mg/dL (ref 0.0–0.3)
Total Bilirubin: 0.6 mg/dL (ref 0.2–1.2)
Total Protein: 7.9 g/dL (ref 6.0–8.3)

## 2020-11-12 LAB — LIPID PANEL
Cholesterol: 176 mg/dL (ref 0–200)
HDL: 39.9 mg/dL (ref >39.00)
NonHDL: 136.49
Total CHOL/HDL Ratio: 4
Triglycerides: 270 mg/dL — ABNORMAL HIGH (ref 0.0–149.0)
VLDL: 54 mg/dL — ABNORMAL HIGH (ref 0.0–40.0)

## 2020-11-12 LAB — CBC WITH DIFFERENTIAL/PLATELET
Basophils Absolute: 0.1 10*3/uL (ref 0.0–0.1)
Basophils Relative: 0.9 % (ref 0.0–3.0)
Eosinophils Absolute: 0.4 10*3/uL (ref 0.0–0.7)
Eosinophils Relative: 3.4 % (ref 0.0–5.0)
HCT: 42.3 % (ref 36.0–46.0)
Hemoglobin: 14.7 g/dL (ref 12.0–15.0)
Lymphocytes Relative: 32.2 % (ref 12.0–46.0)
Lymphs Abs: 3.8 10*3/uL (ref 0.7–4.0)
MCHC: 34.6 g/dL (ref 30.0–36.0)
MCV: 84.1 fl (ref 78.0–100.0)
Monocytes Absolute: 0.6 10*3/uL (ref 0.1–1.0)
Monocytes Relative: 4.6 % (ref 3.0–12.0)
Neutro Abs: 7 10*3/uL (ref 1.4–7.7)
Neutrophils Relative %: 58.9 % (ref 43.0–77.0)
Platelets: 321 10*3/uL (ref 150.0–400.0)
RBC: 5.04 Mil/uL (ref 3.87–5.11)
RDW: 13.3 % (ref 11.5–15.5)
WBC: 11.9 10*3/uL — ABNORMAL HIGH (ref 4.0–10.5)

## 2020-11-12 LAB — BASIC METABOLIC PANEL
BUN: 11 mg/dL (ref 6–23)
CO2: 28 mEq/L (ref 19–32)
Calcium: 10.2 mg/dL (ref 8.4–10.5)
Chloride: 97 mEq/L (ref 96–112)
Creatinine, Ser: 0.48 mg/dL (ref 0.40–1.20)
GFR: 126.77 mL/min (ref >60.00)
Glucose, Bld: 113 mg/dL — ABNORMAL HIGH (ref 70–99)
Potassium: 3.9 mEq/L (ref 3.5–5.1)
Sodium: 136 mEq/L (ref 135–145)

## 2020-11-12 LAB — MICROALBUMIN / CREATININE URINE RATIO
Creatinine,U: 127.4 mg/dL
Microalb Creat Ratio: 20.3 mg/g (ref 0.0–30.0)
Microalb, Ur: 25.8 mg/dL — ABNORMAL HIGH (ref 0.0–1.9)

## 2020-11-12 LAB — LDL CHOLESTEROL, DIRECT: Direct LDL: 111 mg/dL

## 2020-11-12 LAB — TSH: TSH: 2.52 u[IU]/mL (ref 0.35–4.50)

## 2020-11-12 NOTE — Assessment & Plan Note (Signed)
Ongoing issue for pt.  She is not currently on medication b/c she would still like to be pregnant.  Discussed that improved A1C results should improve her trigs.  Will follow.

## 2020-11-12 NOTE — Assessment & Plan Note (Addendum)
Pt has gained 23 lbs since last visit with me.  Discussed need for low carb diet and regular exercise.  Her BMI is now 34.66 which coupled w/ her diabetes and triglycerides, qualifies as morbidly obese.  Will continue to follow.

## 2020-11-12 NOTE — Assessment & Plan Note (Signed)
Ongoing issue for pt.  She currently feels sxs are well controlled on Celexa.  She is dealing w/ a lot as she tries to improve her sugars to proceed on her fertility journey.  No medication adjustments at this time.  Will follow.

## 2020-11-12 NOTE — Patient Instructions (Signed)
Schedule your complete physical in 6 months We'll notify you of your lab results and make any changes if needed Continue to work on healthy diet and regular exercise- you can do it! Call and schedule an eye exam at your convenience and have them send me a copy of the report Call with any questions or concerns Hang in there!!!  You've got this!!!

## 2020-11-12 NOTE — Assessment & Plan Note (Signed)
Ongoing issue.  Now following w/ Dr Lonzo Cloud.  Pt is UTD on A1C but overdue for eye exam (pt to schedule), foot exam, and microalbumin.  Foot exam done today.  Microalbumin ordered.

## 2020-11-12 NOTE — Progress Notes (Signed)
   Subjective:    Patient ID: Kimberly Diaz, female    DOB: [birth year], 30 y.o.   MRN: [id number]  HPI Anxiety- ongoing issue for pt.  Currently on Citalopram 40mg  daily and she feels this is working well.  Hypertriglyceridemia- overdue for cholesterol labs, did not start medications due to desired fertility.  No CP, SOB, abd pain, N/V.  DM- ongoing issue.  Following w/ Endo, most recent A1C 8.0%.  Started insulin in December.  Due for eye exam, foot exam, microalbumin.  Obesity- pt has gained 23 lbs since last visit with me.  BMI is now 34.66  No regular exercise, attempting to follow a low carb diet.   Review of Systems For ROS see HPI   This visit occurred during the SARS-CoV-2 public health emergency.  Safety protocols were in place, including screening questions prior to the visit, additional usage of staff PPE, and extensive cleaning of exam room while observing appropriate contact time as indicated for disinfecting solutions.       Objective:   Physical Exam Vitals reviewed.  Constitutional:      General: She is not in acute distress.    Appearance: Normal appearance. She is well-developed. She is obese. She is not ill-appearing.  HENT:     Head: Normocephalic and atraumatic.  Eyes:     Conjunctiva/sclera: Conjunctivae normal.     Pupils: Pupils are equal, round, and reactive to light.  Neck:     Thyroid: No thyromegaly.  Cardiovascular:     Rate and Rhythm: Normal rate and regular rhythm.     Pulses: Normal pulses.     Heart sounds: Normal heart sounds. No murmur heard.   Pulmonary:     Effort: Pulmonary effort is normal. No respiratory distress.     Breath sounds: Normal breath sounds.  Abdominal:     General: There is no distension.     Palpations: Abdomen is soft.     Tenderness: There is no abdominal tenderness.  Musculoskeletal:     Cervical back: Normal range of motion and neck supple.     Right lower leg: No edema.     Left lower leg: No edema.   Lymphadenopathy:     Cervical: No cervical adenopathy.  Skin:    General: Skin is warm and dry.  Neurological:     General: No focal deficit present.     Mental Status: She is alert and oriented to person, place, and time.  Psychiatric:        Mood and Affect: Mood normal.        Behavior: Behavior normal.        Thought Content: Thought content normal.           Assessment & Plan:

## 2020-11-21 ENCOUNTER — Encounter: Payer: Self-pay | Admitting: Family Medicine

## 2020-11-21 ENCOUNTER — Other Ambulatory Visit: Payer: Self-pay | Admitting: Family Medicine

## 2020-11-21 DIAGNOSIS — F411 Generalized anxiety disorder: Secondary | ICD-10-CM

## 2020-11-22 ENCOUNTER — Encounter: Payer: Self-pay | Admitting: Internal Medicine

## 2020-11-22 ENCOUNTER — Other Ambulatory Visit: Payer: Self-pay

## 2020-11-22 DIAGNOSIS — F411 Generalized anxiety disorder: Secondary | ICD-10-CM

## 2020-11-22 MED ORDER — CITALOPRAM HYDROBROMIDE 40 MG PO TABS: 40.0000 mg | ORAL_TABLET | Freq: Every day | ORAL | 0 refills | Status: DC

## 2020-11-22 MED ORDER — CITALOPRAM HYDROBROMIDE 40 MG PO TABS: 40.0000 mg | ORAL_TABLET | Freq: Every day | ORAL | 1 refills | Status: DC

## 2020-11-22 MED ORDER — ONETOUCH ULTRASOFT LANCETS MISC: 5 refills | Status: DC

## 2020-11-22 MED ORDER — ONETOUCH ULTRA VI STRP: ORAL_STRIP | 5 refills | Status: DC

## 2021-01-01 ENCOUNTER — Encounter: Payer: Self-pay | Admitting: *Deleted

## 2021-01-15 ENCOUNTER — Encounter: Payer: Self-pay | Admitting: Internal Medicine

## 2021-01-20 DIAGNOSIS — M5411 Radiculopathy, occipito-atlanto-axial region: Secondary | ICD-10-CM | POA: Diagnosis not present

## 2021-01-20 DIAGNOSIS — M9901 Segmental and somatic dysfunction of cervical region: Secondary | ICD-10-CM | POA: Diagnosis not present

## 2021-01-20 DIAGNOSIS — M25511 Pain in right shoulder: Secondary | ICD-10-CM | POA: Diagnosis not present

## 2021-01-21 DIAGNOSIS — M25511 Pain in right shoulder: Secondary | ICD-10-CM | POA: Diagnosis not present

## 2021-01-21 DIAGNOSIS — M9901 Segmental and somatic dysfunction of cervical region: Secondary | ICD-10-CM | POA: Diagnosis not present

## 2021-01-21 DIAGNOSIS — M5411 Radiculopathy, occipito-atlanto-axial region: Secondary | ICD-10-CM | POA: Diagnosis not present

## 2021-01-23 DIAGNOSIS — M25511 Pain in right shoulder: Secondary | ICD-10-CM | POA: Diagnosis not present

## 2021-01-23 DIAGNOSIS — M9901 Segmental and somatic dysfunction of cervical region: Secondary | ICD-10-CM | POA: Diagnosis not present

## 2021-01-23 DIAGNOSIS — M5411 Radiculopathy, occipito-atlanto-axial region: Secondary | ICD-10-CM | POA: Diagnosis not present

## 2021-01-24 ENCOUNTER — Other Ambulatory Visit: Payer: BC Managed Care – PPO

## 2021-01-24 ENCOUNTER — Other Ambulatory Visit: Payer: Self-pay

## 2021-01-24 ENCOUNTER — Encounter: Payer: Self-pay | Admitting: Internal Medicine

## 2021-01-24 ENCOUNTER — Ambulatory Visit (INDEPENDENT_AMBULATORY_CARE_PROVIDER_SITE_OTHER): Payer: BC Managed Care – PPO | Admitting: Internal Medicine

## 2021-01-24 VITALS — BP 126/88 | HR 84 | Ht 64.0 in | Wt 208.0 lb

## 2021-01-24 DIAGNOSIS — E119 Type 2 diabetes mellitus without complications: Secondary | ICD-10-CM

## 2021-01-24 DIAGNOSIS — R635 Abnormal weight gain: Secondary | ICD-10-CM

## 2021-01-24 LAB — POCT GLYCOSYLATED HEMOGLOBIN (HGB A1C): Hemoglobin A1C: 7.6 % — AB (ref 4.0–5.6)

## 2021-01-24 LAB — HM DIABETES EYE EXAM

## 2021-01-24 MED ORDER — INSULIN PEN NEEDLE 31G X 5 MM MISC: 1.0000 | 3 refills | Status: DC

## 2021-01-24 MED ORDER — INSULIN GLARGINE-YFGN 100 UNIT/ML ~~LOC~~ SOLN: 44.0000 [IU] | Freq: Every day | SUBCUTANEOUS | 3 refills | Status: DC

## 2021-01-24 MED ORDER — HUMALOG KWIKPEN 200 UNIT/ML ~~LOC~~ SOPN: PEN_INJECTOR | SUBCUTANEOUS | 0 refills | Status: DC

## 2021-01-24 NOTE — Patient Instructions (Addendum)
-   Metformin 750 mg, 2 tablets a day - Continue  Semglee 44  units daily  - Increase Humalog to 25 units with each meal    -Humalog correctional insulin: ADD extra units on insulin to your meal-time Humalog dose if your blood sugars are higher than 140. Use the scale below to help guide you:   Blood sugar before meal Number of units to inject  Less than 140 0 unit  141-  150 1 units  151 -  160 2 units  161-  170 3 units  171-  180 4 units  181- 190 5 units  191 -  200 6 units  201 -  210 7 units  211 -  220 8 units  221 - 230 9 units  231 - 240 10 units  241- 250 11 units  251- 260 12 units  261- 270  13 units      Check out Ozempic    HOW TO TREAT LOW BLOOD SUGARS (Blood sugar LESS THAN 70 MG/DL) Please follow the RULE OF 15 for the treatment of hypoglycemia treatment (when your (blood sugars are less than 70 mg/dL)   STEP 1: Take 15 grams of carbohydrates when your blood sugar is low, which includes:  3-4 GLUCOSE TABS  OR 3-4 OZ OF JUICE OR REGULAR SODA OR ONE TUBE OF GLUCOSE GEL    STEP 2: RECHECK blood sugar in 15 MINUTES STEP 3: If your blood sugar is still low at the 15 minute recheck --> then, go back to STEP 1 and treat AGAIN with another 15 grams of carbohydrates.

## 2021-01-24 NOTE — Progress Notes (Signed)
Name: Kimberly Diaz  Age/ Sex: 30 y.o., female   MRN/ DOB: [id number], 07-29-1990     PCP: Sheliah Hatch, MD   Reason for Endocrinology Evaluation: Type 2 Diabetes Mellitus  Initial Endocrine Consultative Visit: 09/06/2019    PATIENT IDENTIFIER: Kimberly Diaz is a 30 y.o. female with a past medical history of T2DM and PCOS. The patient has followed with Endocrinology clinic since 09/06/2019 for consultative assistance with management of her diabetes.  DIABETIC HISTORY:  Kimberly Diaz was diagnosed with DM in 2016, she has been on metformin since her diagnosis. She has required insulin during 3rd trimester in 2018. Her hemoglobin A1c has ranged from 5.8% in 2018, peaking at 9.0% in 2019.  On her initial visit to our clinic her A1c was 9.8% she was on metformin only, we started her on Glyburide and gradual titration of the dose.    Pt has a 2 yr old daughter at home, she is planning on conceiving in the near future SUBJECTIVE:   During the last visit (10/24/2020): A1c 8.0% . We continued metformin and adjusted MDI regimen    Today (01/24/2021): [name] is here for a follow up on diabetes..  She has been checking recently. The patient has not  had hypoglycemic episodes since the last clinic visit.   Had to decrease metformin due to GI side effects  Had a viral gastritis last week    Had seen Vernona Rieger 07/2020  HOME DIABETES REGIMEN:  Metformin XR 750 mg, BID  Semglee 45  units daily  Humalog 20 units with each meal  CF: Novolog (BG-130/15)      CONTINUOUS GLUCOSE MONITORING RECORD INTERPRETATION    Dates of Recording: 7/9 - 01/24/2021  Sensor description: freestyle libre   Results statistics:   CGM use % of time 75  Average and SD 212/30.8  Time in range    32 %  % Time Above 180 45  % Time above 250 23  % Time Below target 0     Glycemic patterns summary: hyperglycemia through the night and day, worse during the day   Hyperglycemic episodes  Post  prandial   Hypoglycemic episodes occurred N/A  Overnight periods: trends down but still high      DIABETIC COMPLICATIONS: Microvascular complications:    Denies: CKD, neuropathy  Last eye exam: Completed 03/2019   Macrovascular complications:    Denies: CAD, PVD, CVA   HISTORY:  Past Medical History:  Past Medical History:  Diagnosis Date   Anxiety    Chronic back pain    muscle stiffness   Diabetes mellitus without complication (HCC)    metformin   Fatty liver    Hx of varicella    PCOS (polycystic ovarian syndrome)    possible    Past Surgical History:  Past Surgical History:  Procedure Laterality Date   CESAREAN SECTION N/A 11/06/2016   Procedure: CESAREAN SECTION;  Surgeon: Marlow Baars, MD;  [facility]: [facility];  Service: Obstetrics;  Laterality: N/A;   WISDOM TOOTH EXTRACTION  2012   Social History:  reports that she has never smoked. She has never used smokeless tobacco. She reports that she does not drink alcohol and does not use drugs. Family History:  Family History  Problem Relation Age of Onset   Hypertension Father    Diabetes Father    Cancer Maternal Grandfather    Heart disease Maternal Grandfather    Cancer Paternal Grandfather    Breast cancer Paternal Grandmother  HOME MEDICATIONS: Allergies as of 01/24/2021   No Known Allergies      Medication List        Accurate as of January 24, 2021  9:34 AM. If you have any questions, ask your nurse or doctor.          benzonatate 100 MG capsule Commonly known as: Tessalon Perles Take 1 capsule (100 mg total) by mouth 3 (three) times daily as needed.   cholecalciferol 25 MCG (1000 UNIT) tablet Commonly known as: VITAMIN D3 Take 3,000 Units by mouth daily.   citalopram 40 MG tablet Commonly known as: CELEXA Take 1 tablet (40 mg total) by mouth daily for 7 days.   Dexcom G6 Transmitter Misc 1 Device by Does not apply route as directed.   FreeStyle Libre 2 Sensor Misc 1  Device by Does not apply route as directed.   Dexcom G6 Sensor Misc 1 Device by Does not apply route as directed.   HumaLOG KwikPen 200 UNIT/ML KwikPen Generic drug: insulin lispro Max daily 100 units   insulin glargine-yfgn 100 UNIT/ML Soln Commonly known as: Semglee (yfgn) Inject 45 Units into the skin daily.   Insulin Pen Needle 31G X 5 MM Misc 1 Device by Does not apply route as directed.   melatonin 3 MG Tabs tablet Take by mouth.   metFORMIN 750 MG 24 hr tablet Commonly known as: GLUCOPHAGE-XR Take 2 tablets (1,500 mg total) by mouth daily.   ONE TOUCH COMBO PACK Misc by Does not apply route.   onetouch ultrasoft lancets Use as instructed to test blood sugar 2 times daily E11.9   OneTouch Delica Plus Lancing Misc by Does not apply route.   OneTouch Ultra test strip Generic drug: glucose blood Use as instructed to test blood sugar 2 times daily E11.9   PRE-NATAL PO Take by mouth.         OBJECTIVE:   Vital Signs: BP 126/88   Pulse 84   Ht 5\' 4"  (1.626 m)   Wt 208 lb (94.3 kg)   SpO2 99%   BMI 35.70 kg/m   Wt Readings from Last 3 Encounters:  01/24/21 208 lb (94.3 kg)  11/12/20 208 lb 4.8 oz (94.5 kg)  10/24/20 200 lb 4 oz (90.8 kg)     Exam: General: Pt appears well and is in NAD  Lungs: Clear with good BS bilat with no rales, rhonchi, or wheezes  Heart: RRR with normal S1 and S2 and no gallops; no murmurs; no rub  Abdomen: Normoactive bowel sounds, soft, nontender, without masses or organomegaly palpable  Extremities: No pretibial edema.   Neuro: MS is good with appropriate affect, pt is alert and Ox3         DM foot exam: 01/24/2021    The skin of the feet is intact without sores or ulcerations. The pedal pulses are 2+ on right and 2+ on left. The sensation is intact to a screening 5.07, 10 gram monofilament bilaterally    DATA REVIEWED:  Lab Results  Component Value Date   HGBA1C 7.6 (A) 01/24/2021   HGBA1C 8.0 (A) 10/24/2020    HGBA1C 7.7 (A) 07/18/2020   Lab Results  Component Value Date   MICROALBUR 25.8 (H) 11/12/2020   LDLCALC 105 (H) 06/09/2017   CREATININE 0.48 11/12/2020   Lab Results  Component Value Date   MICRALBCREAT 20.3 11/12/2020     Lab Results  Component Value Date   CHOL 176 11/12/2020   HDL 39.90 11/12/2020  LDLCALC 105 (H) 06/09/2017   LDLDIRECT 111.0 11/12/2020   TRIG 270.0 (H) 11/12/2020   CHOLHDL 4 11/12/2020         ASSESSMENT / PLAN / RECOMMENDATIONS:   1) Type 2 Diabetes Mellitus, Poorly  controlled, Without complications - Most recent A1c of 7.6  %. Goal A1c < 7.0 %.     - Pt with insulin resistance and hyperglycemia, we have not been able to add any insulin sensitizers other than the metformin due to planning on conceiving.  I have advised her to consider holding off on conception for 6 months in the meantime we would consider GLP-1 agonist or SGLT2 inhibitors to get her A1c to goal of 6.5.  But for the past year we have not been able to get to that goal. -She has been noted with severe hypoglycemia at night with BG's over 200, I again have advised her to avoid snacks as much as possible, I advised her to also continue checking BG's at bedtime and using correction scale as needed - We had discussed insulin pump in the past -She is going to discuss with her husband regarding add-on therapy  MEDICATIONS: -Continue metformin 750 mg XR to  2 tabs daily -Decrease Semglee to 44 units daily  -Increase Humalog to 25 units with each meal  - CF : Novolog ( BG -130/10)    EDUCATION / INSTRUCTIONS: BG monitoring instructions: Patient is instructed to check her blood sugars 2 times a day, fasting and supper time. Call [place] [facility] if: BG persistently < 70 I reviewed the Rule of 15 for the treatment of hypoglycemia in detail with the patient. Literature supplied.     2) Diabetic complications:  Eye: Does not have known diabetic retinopathy.  Neuro/ Feet:  Does not have known diabetic peripheral neuropathy .  Renal: Patient does not have known baseline CKD.     3) Weight Gain:  -I am going to screen her for Cushing's with 24-hour urine collection, given weight gain and insulin resistance which most likely is induced by insulin as well as oral intake  F/U in 3 months    Signed electronically by: Lyndle Herrlich, MD  [facility] Endocrinology  [facility] [address] [place] [place], [place] [address] Phone: [phone] FAX: [phone]   CC: Sheliah Hatch, MD [address] [place] [address] Phone: [phone]  Fax: [phone]  Return to Endocrinology clinic as below: Future Appointments  Date Time Provider Department Center  05/12/2021 12:30 PM Sheliah Hatch, MD [facility]

## 2021-01-27 ENCOUNTER — Encounter: Payer: Self-pay | Admitting: Internal Medicine

## 2021-01-28 ENCOUNTER — Other Ambulatory Visit: Payer: Self-pay | Admitting: Internal Medicine

## 2021-01-28 DIAGNOSIS — M9901 Segmental and somatic dysfunction of cervical region: Secondary | ICD-10-CM | POA: Diagnosis not present

## 2021-01-28 DIAGNOSIS — M5411 Radiculopathy, occipito-atlanto-axial region: Secondary | ICD-10-CM | POA: Diagnosis not present

## 2021-01-28 DIAGNOSIS — M25511 Pain in right shoulder: Secondary | ICD-10-CM | POA: Diagnosis not present

## 2021-01-28 MED ORDER — OZEMPIC (0.25 OR 0.5 MG/DOSE) 2 MG/1.5ML ~~LOC~~ SOPN: 0.5000 mg | PEN_INJECTOR | SUBCUTANEOUS | 1 refills | Status: DC

## 2021-01-28 NOTE — Progress Notes (Unsigned)
Pt opted to start Ozempic. Will prescribe Ozempic 0.25 mg ONCE weekly for 6 weeks, increase to 0.5 mg after that    Abby Raelyn Mora, MD  [facility] Endocrinology  [facility] [address] [place], [place] [address] Phone: [phone] FAX: [phone]

## 2021-01-30 DIAGNOSIS — M9901 Segmental and somatic dysfunction of cervical region: Secondary | ICD-10-CM | POA: Diagnosis not present

## 2021-01-30 DIAGNOSIS — M25511 Pain in right shoulder: Secondary | ICD-10-CM | POA: Diagnosis not present

## 2021-01-30 DIAGNOSIS — M5411 Radiculopathy, occipito-atlanto-axial region: Secondary | ICD-10-CM | POA: Diagnosis not present

## 2021-02-03 DIAGNOSIS — M9901 Segmental and somatic dysfunction of cervical region: Secondary | ICD-10-CM | POA: Diagnosis not present

## 2021-02-03 DIAGNOSIS — M5411 Radiculopathy, occipito-atlanto-axial region: Secondary | ICD-10-CM | POA: Diagnosis not present

## 2021-02-03 DIAGNOSIS — M25511 Pain in right shoulder: Secondary | ICD-10-CM | POA: Diagnosis not present

## 2021-02-04 ENCOUNTER — Other Ambulatory Visit: Payer: BC Managed Care – PPO

## 2021-02-04 DIAGNOSIS — R635 Abnormal weight gain: Secondary | ICD-10-CM | POA: Diagnosis not present

## 2021-02-05 DIAGNOSIS — M25511 Pain in right shoulder: Secondary | ICD-10-CM | POA: Diagnosis not present

## 2021-02-05 DIAGNOSIS — M5411 Radiculopathy, occipito-atlanto-axial region: Secondary | ICD-10-CM | POA: Diagnosis not present

## 2021-02-05 DIAGNOSIS — M9901 Segmental and somatic dysfunction of cervical region: Secondary | ICD-10-CM | POA: Diagnosis not present

## 2021-02-10 DIAGNOSIS — M5411 Radiculopathy, occipito-atlanto-axial region: Secondary | ICD-10-CM | POA: Diagnosis not present

## 2021-02-10 DIAGNOSIS — M9901 Segmental and somatic dysfunction of cervical region: Secondary | ICD-10-CM | POA: Diagnosis not present

## 2021-02-10 DIAGNOSIS — M25511 Pain in right shoulder: Secondary | ICD-10-CM | POA: Diagnosis not present

## 2021-02-10 LAB — EXTRA SPECIMEN

## 2021-02-10 LAB — CORTISOL, FREE AND CORTISONE, 24 HOUR URINE W/CREATININE
24 Hour urine volume (VMAHVA): 1700 mL
CREATININE, URINE: 1.6 g/(24.h) (ref 0.50–2.15)
Cortisol (Ur), Free: 12 mcg/24 h (ref 4.0–50.0)
Cortisone, 24H Ur: 65.3 mcg/24 h (ref 23–195)

## 2021-02-13 DIAGNOSIS — M25511 Pain in right shoulder: Secondary | ICD-10-CM | POA: Diagnosis not present

## 2021-02-13 DIAGNOSIS — M9901 Segmental and somatic dysfunction of cervical region: Secondary | ICD-10-CM | POA: Diagnosis not present

## 2021-02-13 DIAGNOSIS — M5411 Radiculopathy, occipito-atlanto-axial region: Secondary | ICD-10-CM | POA: Diagnosis not present

## 2021-02-14 DIAGNOSIS — Z01419 Encounter for gynecological examination (general) (routine) without abnormal findings: Secondary | ICD-10-CM | POA: Diagnosis not present

## 2021-02-17 DIAGNOSIS — M9901 Segmental and somatic dysfunction of cervical region: Secondary | ICD-10-CM | POA: Diagnosis not present

## 2021-02-17 DIAGNOSIS — M5411 Radiculopathy, occipito-atlanto-axial region: Secondary | ICD-10-CM | POA: Diagnosis not present

## 2021-02-17 DIAGNOSIS — M25511 Pain in right shoulder: Secondary | ICD-10-CM | POA: Diagnosis not present

## 2021-02-20 DIAGNOSIS — M25511 Pain in right shoulder: Secondary | ICD-10-CM | POA: Diagnosis not present

## 2021-02-20 DIAGNOSIS — M5411 Radiculopathy, occipito-atlanto-axial region: Secondary | ICD-10-CM | POA: Diagnosis not present

## 2021-02-20 DIAGNOSIS — M9901 Segmental and somatic dysfunction of cervical region: Secondary | ICD-10-CM | POA: Diagnosis not present

## 2021-03-03 DIAGNOSIS — M9901 Segmental and somatic dysfunction of cervical region: Secondary | ICD-10-CM | POA: Diagnosis not present

## 2021-03-03 DIAGNOSIS — M25511 Pain in right shoulder: Secondary | ICD-10-CM | POA: Diagnosis not present

## 2021-03-03 DIAGNOSIS — M5411 Radiculopathy, occipito-atlanto-axial region: Secondary | ICD-10-CM | POA: Diagnosis not present

## 2021-03-13 ENCOUNTER — Other Ambulatory Visit: Payer: Self-pay | Admitting: Internal Medicine

## 2021-03-13 DIAGNOSIS — M5411 Radiculopathy, occipito-atlanto-axial region: Secondary | ICD-10-CM | POA: Diagnosis not present

## 2021-03-13 DIAGNOSIS — M9901 Segmental and somatic dysfunction of cervical region: Secondary | ICD-10-CM | POA: Diagnosis not present

## 2021-03-13 DIAGNOSIS — M25511 Pain in right shoulder: Secondary | ICD-10-CM | POA: Diagnosis not present

## 2021-03-17 ENCOUNTER — Other Ambulatory Visit: Payer: Self-pay | Admitting: Internal Medicine

## 2021-03-17 ENCOUNTER — Encounter: Payer: Self-pay | Admitting: Internal Medicine

## 2021-03-17 MED ORDER — INSULIN GLARGINE-YFGN 100 UNIT/ML ~~LOC~~ SOLN: 40.0000 [IU] | Freq: Every day | SUBCUTANEOUS | 3 refills | Status: DC

## 2021-03-20 DIAGNOSIS — M5411 Radiculopathy, occipito-atlanto-axial region: Secondary | ICD-10-CM | POA: Diagnosis not present

## 2021-03-20 DIAGNOSIS — M9901 Segmental and somatic dysfunction of cervical region: Secondary | ICD-10-CM | POA: Diagnosis not present

## 2021-03-20 DIAGNOSIS — M25511 Pain in right shoulder: Secondary | ICD-10-CM | POA: Diagnosis not present

## 2021-03-27 DIAGNOSIS — M25511 Pain in right shoulder: Secondary | ICD-10-CM | POA: Diagnosis not present

## 2021-03-27 DIAGNOSIS — M9901 Segmental and somatic dysfunction of cervical region: Secondary | ICD-10-CM | POA: Diagnosis not present

## 2021-03-27 DIAGNOSIS — M5411 Radiculopathy, occipito-atlanto-axial region: Secondary | ICD-10-CM | POA: Diagnosis not present

## 2021-04-03 DIAGNOSIS — M25511 Pain in right shoulder: Secondary | ICD-10-CM | POA: Diagnosis not present

## 2021-04-03 DIAGNOSIS — M9901 Segmental and somatic dysfunction of cervical region: Secondary | ICD-10-CM | POA: Diagnosis not present

## 2021-04-03 DIAGNOSIS — M5411 Radiculopathy, occipito-atlanto-axial region: Secondary | ICD-10-CM | POA: Diagnosis not present

## 2021-04-10 DIAGNOSIS — M25511 Pain in right shoulder: Secondary | ICD-10-CM | POA: Diagnosis not present

## 2021-04-10 DIAGNOSIS — M9901 Segmental and somatic dysfunction of cervical region: Secondary | ICD-10-CM | POA: Diagnosis not present

## 2021-04-10 DIAGNOSIS — M5411 Radiculopathy, occipito-atlanto-axial region: Secondary | ICD-10-CM | POA: Diagnosis not present

## 2021-04-23 DIAGNOSIS — M25511 Pain in right shoulder: Secondary | ICD-10-CM | POA: Diagnosis not present

## 2021-04-23 DIAGNOSIS — M9901 Segmental and somatic dysfunction of cervical region: Secondary | ICD-10-CM | POA: Diagnosis not present

## 2021-04-23 DIAGNOSIS — M5411 Radiculopathy, occipito-atlanto-axial region: Secondary | ICD-10-CM | POA: Diagnosis not present

## 2021-04-30 ENCOUNTER — Encounter: Payer: Self-pay | Admitting: Internal Medicine

## 2021-04-30 ENCOUNTER — Ambulatory Visit (INDEPENDENT_AMBULATORY_CARE_PROVIDER_SITE_OTHER): Payer: BC Managed Care – PPO | Admitting: Internal Medicine

## 2021-04-30 ENCOUNTER — Other Ambulatory Visit: Payer: Self-pay

## 2021-04-30 VITALS — BP 126/74 | HR 88 | Ht 64.0 in | Wt 205.0 lb

## 2021-04-30 DIAGNOSIS — E119 Type 2 diabetes mellitus without complications: Secondary | ICD-10-CM

## 2021-04-30 LAB — POCT GLYCOSYLATED HEMOGLOBIN (HGB A1C): Hemoglobin A1C: 6.1 % — AB (ref 4.0–5.6)

## 2021-04-30 MED ORDER — OZEMPIC (1 MG/DOSE) 4 MG/3ML ~~LOC~~ SOPN: 1.0000 mg | PEN_INJECTOR | SUBCUTANEOUS | 2 refills | Status: DC

## 2021-04-30 NOTE — Progress Notes (Signed)
Name: Kimberly Diaz  Age/ Sex: 30 y.o., female   MRN/ DOB: [id number], 04-Oct-1990     PCP: Sheliah Hatch, MD   Reason for Endocrinology Evaluation: Type 2 Diabetes Mellitus  Initial Endocrine Consultative Visit: 09/06/2019    PATIENT IDENTIFIER: Kimberly Diaz is a 30 y.o. female with a past medical history of T2DM and PCOS. The patient has followed with Endocrinology clinic since 09/06/2019 for consultative assistance with management of her diabetes.  DIABETIC HISTORY:  Kimberly Diaz was diagnosed with DM in 2016, she has been on metformin since her diagnosis. She has required insulin during 3rd trimester in 2018. Her hemoglobin A1c has ranged from 5.8% in 2018, peaking at 9.0% in 2019.  On her initial visit to our clinic her A1c was 9.8% she was on metformin only, we started her on Glyburide and gradual titration of the dose.   She was switched to MDI regimen based on OB recommendations 07/2020 Ozempic started 01/2021  Pt has a 2 yr old daughter at home, she is planning on conceiving in the near future SUBJECTIVE:   During the last visit (01/24/2021): A1c 7.6 % . We continued metformin and adjusted MDI regimen    Today (04/30/2021): Kimberly Diaz is here for a follow up on diabetes..  She has been checking multiple times a day  through the CGM.  The patient has not  had hypoglycemic episodes since the last clinic visit.   She has mild nausea the day following intake of Ozempic  She has been noted with weight loss  She has not taken Humalog for the past 2 weeks , She has not been using the scale either    LMP 04/10/2021  HOME DIABETES REGIMEN:  Metformin XR 750 mg, BID  Semglee 40 units daily  Humalog 20 units with each meal -not taking CF: Humalog (BG-130/10)  Ozempic 0.5 mg weekly      CONTINUOUS GLUCOSE MONITORING RECORD INTERPRETATION    Dates of Recording: 10/13-10/26/2022  Sensor description: freestyle libre   Results statistics:   CGM use % of time 61   Average and SD 163/25.4  Time in range   69 %  % Time Above 180 28  % Time above 250 3  % Time Below target 0     Glycemic patterns summary: BG's optimal overnight and up until late evening when they trend upwards  Hyperglycemic episodes  Post prandial   Hypoglycemic episodes occurred N/A  Overnight periods: trends down      DIABETIC COMPLICATIONS: Microvascular complications:    Denies: CKD, neuropathy  Last eye exam: Completed 03/2019   Macrovascular complications:    Denies: CAD, PVD, CVA   HISTORY:  Past Medical History:  Past Medical History:  Diagnosis Date   Anxiety    Chronic back pain    muscle stiffness   Diabetes mellitus without complication (HCC)    metformin   Fatty liver    Hx of varicella    PCOS (polycystic ovarian syndrome)    possible    Past Surgical History:  Past Surgical History:  Procedure Laterality Date   CESAREAN SECTION N/A 11/06/2016   Procedure: CESAREAN SECTION;  Surgeon: Marlow Baars, MD;  Location: [facility];  Service: Obstetrics;  Laterality: N/A;   WISDOM TOOTH EXTRACTION  2012   Social History:  reports that she has never smoked. She has never used smokeless tobacco. She reports that she does not drink alcohol and does not use drugs. Family History:  Family  History  Problem Relation Age of Onset   Hypertension Father    Diabetes Father    Cancer Maternal Grandfather    Heart disease Maternal Grandfather    Cancer Paternal Grandfather    Breast cancer Paternal Grandmother      HOME MEDICATIONS: Allergies as of 04/30/2021   No Known Allergies      Medication List        Accurate as of April 30, 2021  9:35 AM. If you have any questions, ask your nurse or doctor.          STOP taking these medications    benzonatate 100 MG capsule Commonly known as: Lawyer Stopped by: Scarlette Shorts, MD   Dexcom G6 Transmitter Misc Stopped by: Scarlette Shorts, MD   melatonin 3 MG Tabs  tablet Stopped by: Scarlette Shorts, MD       TAKE these medications    cholecalciferol 25 MCG (1000 UNIT) tablet Commonly known as: VITAMIN D3 Take 3,000 Units by mouth daily.   citalopram 40 MG tablet Commonly known as: CELEXA Take 1 tablet (40 mg total) by mouth daily for 7 days.   FreeStyle Libre 2 Sensor Misc USE 1 SENSOR AS DIRECTED What changed: Another medication with the same name was removed. Continue taking this medication, and follow the directions you see here. Changed by: Scarlette Shorts, MD   HumaLOG KwikPen 200 UNIT/ML KwikPen Generic drug: insulin lispro Max daily 100 units   insulin glargine-yfgn 100 UNIT/ML injection Commonly known as: Semglee (yfgn) Inject 0.4 mLs (40 Units total) into the skin daily. Insulin pen   Insulin Pen Needle 31G X 5 MM Misc 1 Device by Does not apply route as directed.   metFORMIN 750 MG 24 hr tablet Commonly known as: GLUCOPHAGE-XR Take 2 tablets (1,500 mg total) by mouth daily.   ONE TOUCH COMBO PACK Misc by Does not apply route.   onetouch ultrasoft lancets Use as instructed to test blood sugar 2 times daily E11.9   OneTouch Delica Plus Lancing Misc by Does not apply route.   OneTouch Ultra test strip Generic drug: glucose blood Use as instructed to test blood sugar 2 times daily E11.9   Ozempic (0.25 or 0.5 MG/DOSE) 2 MG/1.5ML Sopn Generic drug: Semaglutide(0.25 or 0.5MG /DOS) Inject 0.5 mg into the skin once a week.   PRE-NATAL PO Take by mouth.         OBJECTIVE:   Vital Signs: BP 126/74 (BP Location: Right Arm, Patient Position: Sitting, Cuff Size: Small)   Pulse 88   Ht 5\' 4"  (1.626 m)   Wt 205 lb (93 kg)   SpO2 99%   BMI 35.19 kg/m   Wt Readings from Last 3 Encounters:  04/30/21 205 lb (93 kg)  01/24/21 208 lb (94.3 kg)  11/12/20 208 lb 4.8 oz (94.5 kg)     Exam: General: Pt appears well and is in NAD  Lungs: Clear with good BS bilat with no rales, rhonchi, or wheezes  Heart:  RRR with normal S1 and S2 and no gallops; no murmurs; no rub  Abdomen: Normoactive bowel sounds, soft, nontender, without masses or organomegaly palpable  Extremities: No pretibial edema.   Neuro: MS is good with appropriate affect, pt is alert and Ox3         DM foot exam: 01/24/2021    The skin of the feet is intact without sores or ulcerations. The pedal pulses are 2+ on right and 2+ on left. The  sensation is intact to a screening 5.07, 10 gram monofilament bilaterally    DATA REVIEWED:  Lab Results  Component Value Date   HGBA1C 6.1 (A) 04/30/2021   HGBA1C 7.6 (A) 01/24/2021   HGBA1C 8.0 (A) 10/24/2020   Lab Results  Component Value Date   MICROALBUR 25.8 (H) 11/12/2020   LDLCALC 105 (H) 06/09/2017   CREATININE 0.48 11/12/2020   Lab Results  Component Value Date   MICRALBCREAT 20.3 11/12/2020     Lab Results  Component Value Date   CHOL 176 11/12/2020   HDL 39.90 11/12/2020   LDLCALC 105 (H) 06/09/2017   LDLDIRECT 111.0 11/12/2020   TRIG 270.0 (H) 11/12/2020   CHOLHDL 4 11/12/2020         ASSESSMENT / PLAN / RECOMMENDATIONS:   1) Type 2 Diabetes Mellitus, Optimally controlled, Without complications - Most recent A1c of 6.1 %. Goal A1c < 7.0 %.     -I have praised the patient on improved glycemic control with an A1c down from 7.6% -She has self discontinued prandial dose of insulin, she has not been using correction scale, I have advised the patient to use correction scale with each meal and bedtime if needed, we will determine if a standing dose of prandial insulin is needed She is tolerating Ozempic, will increase as below She will make an appointment with infertility clinic to discuss future planning, she understands that Ozempic has not been recommended during pregnancy and she will have to discontinue this prior to conception attempts   MEDICATIONS: -Continue metformin 750 mg XR to  2 tabs daily -Continue Semglee 40 units daily  -Increase Ozempic to  1 mg weekly -Correction factor: Humalog ( BG -130/10)    EDUCATION / INSTRUCTIONS: BG monitoring instructions: Patient is instructed to check her blood sugars 2 times a day, fasting and supper time. Call [place] [facility] if: BG persistently < 70 I reviewed the Rule of 15 for the treatment of hypoglycemia in detail with the patient. Literature supplied.     2) Diabetic complications:  Eye: Does not have known diabetic retinopathy.  Neuro/ Feet: Does not have known diabetic peripheral neuropathy .  Renal: Patient does not have known baseline CKD.       F/U in 4 months    Signed electronically by: Lyndle Herrlich, MD  [facility] Endocrinology  [facility] [address] [place] [place], [place] [address] Phone: [phone] FAX: [phone]   CC: Sheliah Hatch, MD [address] [place] [address] Phone: [phone]  Fax: [phone]  Return to Endocrinology clinic as below: Future Appointments  Date Time Provider Department Center  05/12/2021 12:30 PM Sheliah Hatch, MD [facility]

## 2021-04-30 NOTE — Patient Instructions (Addendum)
-   Keep Up the Good Work ! - Continue Metformin 750 mg, 2 tablets a day - Continue  Semglee 40  units daily  - Increase Ozempic to 1 mg weekly     -Humalog correctional insulin: Use the scale below to help guide you:   Blood sugar before meal Number of units to inject  Less than 140 0 unit  141-  150 1 units  151 -  160 2 units  161-  170 3 units  171-  180 4 units  181- 190 5 units  191 -  200 6 units  201 -  210 7 units  211 -  220 8 units  221 - 230 9 units  231 - 240 10 units  241- 250 11 units  251- 260 12 units  261- 270  13 units       HOW TO TREAT LOW BLOOD SUGARS (Blood sugar LESS THAN 70 MG/DL) Please follow the RULE OF 15 for the treatment of hypoglycemia treatment (when your (blood sugars are less than 70 mg/dL)   STEP 1: Take 15 grams of carbohydrates when your blood sugar is low, which includes:  3-4 GLUCOSE TABS  OR 3-4 OZ OF JUICE OR REGULAR SODA OR ONE TUBE OF GLUCOSE GEL    STEP 2: RECHECK blood sugar in 15 MINUTES STEP 3: If your blood sugar is still low at the 15 minute recheck --> then, go back to STEP 1 and treat AGAIN with another 15 grams of carbohydrates.

## 2021-05-08 DIAGNOSIS — M9901 Segmental and somatic dysfunction of cervical region: Secondary | ICD-10-CM | POA: Diagnosis not present

## 2021-05-08 DIAGNOSIS — M25511 Pain in right shoulder: Secondary | ICD-10-CM | POA: Diagnosis not present

## 2021-05-08 DIAGNOSIS — M5411 Radiculopathy, occipito-atlanto-axial region: Secondary | ICD-10-CM | POA: Diagnosis not present

## 2021-05-12 ENCOUNTER — Encounter: Payer: Self-pay | Admitting: Family Medicine

## 2021-05-12 ENCOUNTER — Ambulatory Visit (INDEPENDENT_AMBULATORY_CARE_PROVIDER_SITE_OTHER): Payer: BC Managed Care – PPO | Admitting: Family Medicine

## 2021-05-12 VITALS — BP 118/78 | HR 81 | Temp 98.1°F | Resp 16 | Ht 64.0 in | Wt 205.6 lb

## 2021-05-12 DIAGNOSIS — E559 Vitamin D deficiency, unspecified: Secondary | ICD-10-CM | POA: Insufficient documentation

## 2021-05-12 DIAGNOSIS — Z0000 Encounter for general adult medical examination without abnormal findings: Secondary | ICD-10-CM | POA: Diagnosis not present

## 2021-05-12 LAB — CBC WITH DIFFERENTIAL/PLATELET
Basophils Absolute: 0.1 10*3/uL (ref 0.0–0.1)
Basophils Relative: 0.5 % (ref 0.0–3.0)
Eosinophils Absolute: 0.4 10*3/uL (ref 0.0–0.7)
Eosinophils Relative: 3.2 % (ref 0.0–5.0)
HCT: 40.6 % (ref 36.0–46.0)
Hemoglobin: 14 g/dL (ref 12.0–15.0)
Lymphocytes Relative: 34.4 % (ref 12.0–46.0)
Lymphs Abs: 4.3 10*3/uL — ABNORMAL HIGH (ref 0.7–4.0)
MCHC: 34.5 g/dL (ref 30.0–36.0)
MCV: 85.8 fl (ref 78.0–100.0)
Monocytes Absolute: 0.6 10*3/uL (ref 0.1–1.0)
Monocytes Relative: 4.8 % (ref 3.0–12.0)
Neutro Abs: 7.1 10*3/uL (ref 1.4–7.7)
Neutrophils Relative %: 57.1 % (ref 43.0–77.0)
Platelets: 325 10*3/uL (ref 150.0–400.0)
RBC: 4.73 Mil/uL (ref 3.87–5.11)
RDW: 13.2 % (ref 11.5–15.5)
WBC: 12.4 10*3/uL — ABNORMAL HIGH (ref 4.0–10.5)

## 2021-05-12 LAB — BASIC METABOLIC PANEL
BUN: 9 mg/dL (ref 6–23)
CO2: 31 mEq/L (ref 19–32)
Calcium: 9.7 mg/dL (ref 8.4–10.5)
Chloride: 100 mEq/L (ref 96–112)
Creatinine, Ser: 0.56 mg/dL (ref 0.40–1.20)
GFR: 121.72 mL/min (ref >60.00)
Glucose, Bld: 97 mg/dL (ref 70–99)
Potassium: 4 mEq/L (ref 3.5–5.1)
Sodium: 138 mEq/L (ref 135–145)

## 2021-05-12 LAB — TSH: TSH: 1.88 u[IU]/mL (ref 0.35–5.50)

## 2021-05-12 LAB — HEPATIC FUNCTION PANEL
ALT: 34 U/L (ref 0–35)
AST: 28 U/L (ref 0–37)
Albumin: 4.3 g/dL (ref 3.5–5.2)
Alkaline Phosphatase: 66 U/L (ref 39–117)
Bilirubin, Direct: 0.2 mg/dL (ref 0.0–0.3)
Total Bilirubin: 1 mg/dL (ref 0.2–1.2)
Total Protein: 7.5 g/dL (ref 6.0–8.3)

## 2021-05-12 LAB — VITAMIN D 25 HYDROXY (VIT D DEFICIENCY, FRACTURES): VITD: 47.21 ng/mL (ref 30.00–100.00)

## 2021-05-12 LAB — LIPID PANEL
Cholesterol: 155 mg/dL (ref 0–200)
HDL: 38.2 mg/dL — ABNORMAL LOW (ref >39.00)
LDL Cholesterol: 91 mg/dL (ref 0–99)
NonHDL: 116.97
Total CHOL/HDL Ratio: 4
Triglycerides: 128 mg/dL (ref 0.0–149.0)
VLDL: 25.6 mg/dL (ref 0.0–40.0)

## 2021-05-12 NOTE — Progress Notes (Signed)
   Subjective:    Patient ID: Kimberly Diaz, female    DOB: 1991/06/23, 30 y.o.   MRN: [id number]  HPI CPE- UTD on pap, foot exam, microalbumin.  Eye exam scheduled for tomorrow.  UTD on Tdap.  Declines flu.  Patient Care Team    Relationship Specialty Notifications Start End  Sheliah Hatch, MD PCP - General Family Medicine  09/20/14   Magda Kiel, MD  Obstetrics and Gynecology  09/19/14     Health Maintenance  Topic Date Due   Pneumococcal Vaccine 60-11 Years old (1 - PCV) Never done   OPHTHALMOLOGY EXAM  02/15/2020   COVID-19 Vaccine (3 - Booster for Pfizer series) 07/10/2020   INFLUENZA VACCINE  10/03/2021 (Originally 02/03/2021)   HEMOGLOBIN A1C  10/29/2021   FOOT EXAM  11/12/2021   URINE MICROALBUMIN  11/12/2021   PAP SMEAR-Modifier  01/08/2022   TETANUS/TDAP  08/26/2026   Hepatitis C Screening  Completed   HIV Screening  Completed   HPV VACCINES  Aged Out      Review of Systems Patient reports no vision/ hearing changes, adenopathy,fever, weight change,  persistant/recurrent hoarseness , swallowing issues, chest pain, palpitations, edema, persistant/recurrent cough, hemoptysis, dyspnea (rest/exertional/paroxysmal nocturnal), gastrointestinal bleeding (melena, rectal bleeding), abdominal pain, significant heartburn, bowel changes, GU symptoms (dysuria, hematuria, incontinence), Gyn symptoms (abnormal  bleeding, pain),  syncope, focal weakness, memory loss, numbness & tingling, skin/hair/nail changes, abnormal bruising or bleeding, anxiety, or depression.   This visit occurred during the SARS-CoV-2 public health emergency.  Safety protocols were in place, including screening questions prior to the visit, additional usage of staff PPE, and extensive cleaning of exam room while observing appropriate contact time as indicated for disinfecting solutions.      Objective:   Physical Exam General Appearance:    Alert, cooperative, no distress, appears stated age  Head:     Normocephalic, without obvious abnormality, atraumatic  Eyes:    PERRL, conjunctiva/corneas clear, EOM's intact, fundi    benign, both eyes  Ears:    Normal TM's and external ear canals, both ears  Nose:   Deferred due to COVID  Throat:   Neck:   Supple, symmetrical, trachea midline, no adenopathy;    Thyroid: no enlargement/tenderness/nodules  Back:     Symmetric, no curvature, ROM normal, no CVA tenderness  Lungs:     Clear to auscultation bilaterally, respirations unlabored  Chest Wall:    No tenderness or deformity   Heart:    Regular rate and rhythm, S1 and S2 normal, no murmur, rub   or gallop  Breast Exam:    Deferred to GYN  Abdomen:     Soft, non-tender, bowel sounds active all four quadrants,    no masses, no organomegaly  Genitalia:    Deferred to GYN  Rectal:    Extremities:   Extremities normal, atraumatic, no cyanosis or edema  Pulses:   2+ and symmetric all extremities  Skin:   Skin color, texture, turgor normal, no rashes or lesions  Lymph nodes:   Cervical, supraclavicular, and axillary nodes normal  Neurologic:   CNII-XII intact, normal strength, sensation and reflexes    throughout          Assessment & Plan:

## 2021-05-12 NOTE — Assessment & Plan Note (Signed)
Pt's PE WNL w/ exception of obesity.  UTD on pap, Tdap.  Declines flu shot.  Eye exam scheduled for tomorrow.  Check labs.  Anticipatory guidance provided.

## 2021-05-12 NOTE — Assessment & Plan Note (Signed)
Ongoing issue for pt.  BMI is 35.29 but coupled w/ her hypertriglyceridemia and DM this qualifies as morbidly obese.  She gained a lot of weight due to her insulin but this is improving now that she's on Ozempic.  Will continue to follow.

## 2021-05-12 NOTE — Assessment & Plan Note (Signed)
Check labs and replete prn. 

## 2021-05-12 NOTE — Patient Instructions (Signed)
Follow up in 6 months to recheck BP and cholesterol as we continue on the fertility journey We'll notify you of your lab results and make any changes if needed Continue to work on healthy diet and regular exercise- you're doing great!!! Have your eye doctor send me a copy of their report Call with any questions or concerns Stay Safe!  Stay Healthy! Happy Holidays!!

## 2021-05-13 DIAGNOSIS — E119 Type 2 diabetes mellitus without complications: Secondary | ICD-10-CM | POA: Diagnosis not present

## 2021-05-13 LAB — HM DIABETES EYE EXAM

## 2021-05-14 DIAGNOSIS — Z3169 Encounter for other general counseling and advice on procreation: Secondary | ICD-10-CM | POA: Diagnosis not present

## 2021-05-15 ENCOUNTER — Other Ambulatory Visit: Payer: Self-pay | Admitting: Internal Medicine

## 2021-05-15 ENCOUNTER — Other Ambulatory Visit: Payer: Self-pay | Admitting: Family Medicine

## 2021-05-15 DIAGNOSIS — F411 Generalized anxiety disorder: Secondary | ICD-10-CM

## 2021-05-22 ENCOUNTER — Encounter: Payer: Self-pay | Admitting: Internal Medicine

## 2021-05-27 ENCOUNTER — Telehealth: Payer: Self-pay

## 2021-05-27 NOTE — Telephone Encounter (Signed)
Spoke to patient and let her know that the form had been faxed on 11/10 with a successful tx notice. I am also emailing to patient so she can have it

## 2021-05-27 NOTE — Telephone Encounter (Signed)
Form left on 11/7 with Dr Beverely Low during appt for [employer] that needed to be completed and faxed. Once the lab results come back they would fax the from and the results come back but no form was ever faxed?  Pt call back [phone]

## 2021-06-04 ENCOUNTER — Encounter: Payer: Self-pay | Admitting: Family Medicine

## 2021-06-05 DIAGNOSIS — M25511 Pain in right shoulder: Secondary | ICD-10-CM | POA: Diagnosis not present

## 2021-06-05 DIAGNOSIS — M9901 Segmental and somatic dysfunction of cervical region: Secondary | ICD-10-CM | POA: Diagnosis not present

## 2021-06-05 DIAGNOSIS — M5411 Radiculopathy, occipito-atlanto-axial region: Secondary | ICD-10-CM | POA: Diagnosis not present

## 2021-06-12 ENCOUNTER — Encounter: Payer: Self-pay | Admitting: Internal Medicine

## 2021-06-12 DIAGNOSIS — Z3141 Encounter for fertility testing: Secondary | ICD-10-CM | POA: Diagnosis not present

## 2021-06-12 DIAGNOSIS — Z3200 Encounter for pregnancy test, result unknown: Secondary | ICD-10-CM | POA: Diagnosis not present

## 2021-06-20 DIAGNOSIS — Z3141 Encounter for fertility testing: Secondary | ICD-10-CM | POA: Diagnosis not present

## 2021-06-26 DIAGNOSIS — M9901 Segmental and somatic dysfunction of cervical region: Secondary | ICD-10-CM | POA: Diagnosis not present

## 2021-06-26 DIAGNOSIS — M25511 Pain in right shoulder: Secondary | ICD-10-CM | POA: Diagnosis not present

## 2021-06-26 DIAGNOSIS — M5411 Radiculopathy, occipito-atlanto-axial region: Secondary | ICD-10-CM | POA: Diagnosis not present

## 2021-07-14 DIAGNOSIS — Z3141 Encounter for fertility testing: Secondary | ICD-10-CM | POA: Diagnosis not present

## 2021-07-18 DIAGNOSIS — Z3141 Encounter for fertility testing: Secondary | ICD-10-CM | POA: Diagnosis not present

## 2021-07-22 DIAGNOSIS — Z3141 Encounter for fertility testing: Secondary | ICD-10-CM | POA: Diagnosis not present

## 2021-07-25 DIAGNOSIS — Z3141 Encounter for fertility testing: Secondary | ICD-10-CM | POA: Diagnosis not present

## 2021-07-28 DIAGNOSIS — Z3141 Encounter for fertility testing: Secondary | ICD-10-CM | POA: Diagnosis not present

## 2021-08-05 ENCOUNTER — Encounter: Payer: Self-pay | Admitting: Internal Medicine

## 2021-08-14 DIAGNOSIS — O26891 Other specified pregnancy related conditions, first trimester: Secondary | ICD-10-CM | POA: Diagnosis not present

## 2021-08-14 DIAGNOSIS — Z3A00 Weeks of gestation of pregnancy not specified: Secondary | ICD-10-CM | POA: Diagnosis not present

## 2021-08-18 DIAGNOSIS — Z3A00 Weeks of gestation of pregnancy not specified: Secondary | ICD-10-CM | POA: Diagnosis not present

## 2021-08-18 DIAGNOSIS — Z3200 Encounter for pregnancy test, result unknown: Secondary | ICD-10-CM | POA: Diagnosis not present

## 2021-08-18 DIAGNOSIS — O0901 Supervision of pregnancy with history of infertility, first trimester: Secondary | ICD-10-CM | POA: Diagnosis not present

## 2021-09-02 DIAGNOSIS — O0901 Supervision of pregnancy with history of infertility, first trimester: Secondary | ICD-10-CM | POA: Diagnosis not present

## 2021-09-02 DIAGNOSIS — Z3A00 Weeks of gestation of pregnancy not specified: Secondary | ICD-10-CM | POA: Diagnosis not present

## 2021-09-03 ENCOUNTER — Ambulatory Visit: Payer: BC Managed Care – PPO | Admitting: Internal Medicine

## 2021-09-03 DIAGNOSIS — Z03818 Encounter for observation for suspected exposure to other biological agents ruled out: Secondary | ICD-10-CM | POA: Diagnosis not present

## 2021-09-03 DIAGNOSIS — J018 Other acute sinusitis: Secondary | ICD-10-CM | POA: Diagnosis not present

## 2021-09-03 DIAGNOSIS — Z20822 Contact with and (suspected) exposure to covid-19: Secondary | ICD-10-CM | POA: Diagnosis not present

## 2021-09-03 DIAGNOSIS — J029 Acute pharyngitis, unspecified: Secondary | ICD-10-CM | POA: Diagnosis not present

## 2021-09-09 ENCOUNTER — Encounter: Payer: Self-pay | Admitting: Internal Medicine

## 2021-09-10 ENCOUNTER — Other Ambulatory Visit: Payer: Self-pay

## 2021-09-10 MED ORDER — ONETOUCH ULTRA VI STRP
ORAL_STRIP | 0 refills | Status: DC
Start: 2021-09-10 — End: 2022-01-21

## 2021-09-19 DIAGNOSIS — Z3480 Encounter for supervision of other normal pregnancy, unspecified trimester: Secondary | ICD-10-CM | POA: Diagnosis not present

## 2021-09-19 DIAGNOSIS — N925 Other specified irregular menstruation: Secondary | ICD-10-CM | POA: Diagnosis not present

## 2021-09-19 DIAGNOSIS — Z113 Encounter for screening for infections with a predominantly sexual mode of transmission: Secondary | ICD-10-CM | POA: Diagnosis not present

## 2021-09-19 DIAGNOSIS — O0991 Supervision of high risk pregnancy, unspecified, first trimester: Secondary | ICD-10-CM | POA: Diagnosis not present

## 2021-09-19 LAB — OB RESULTS CONSOLE GC/CHLAMYDIA
Chlamydia: NEGATIVE
Neisseria Gonorrhea: NEGATIVE

## 2021-09-19 LAB — HM PAP SMEAR

## 2021-09-22 NOTE — Progress Notes (Signed)
? ?Name: Kimberly Diaz  ?Age/ Sex: 31 y.o., female   ?MRN/ DOB: [id number], March 09, 1991    ? ?PCP: Midge Minium, MD   ?Reason for Endocrinology Evaluation: Type 2 Diabetes Mellitus  ?Initial Endocrine Consultative Visit: 09/06/2019  ? ? ?PATIENT IDENTIFIER: Kimberly Diaz is a 31 y.o. female with a past medical history of T2DM and PCOS. The patient has followed with Endocrinology clinic since 09/06/2019 for consultative assistance with management of her diabetes. ? ?DIABETIC HISTORY:  ?Kimberly Diaz was diagnosed with DM in 2016, she has been on metformin since her diagnosis. She has required insulin during 3rd trimester in 2018. Her hemoglobin A1c has ranged from 5.8% in 2018, peaking at 9.0% in 2019. ? ?On her initial visit to our clinic her A1c was 9.8% she was on metformin only, we started her on Glyburide and gradual titration of the dose.  ? ?She was switched to MDI regimen based on OB recommendations 07/2020 ?Ozempic started 01/2021 ? ?Pt has a 2 yr old daughter at home, she is planning on conceiving in the near future ?SUBJECTIVE:  ? ?During the last visit (04/30/2021): A1c 6.1 % . We continued metformin and adjusted MDI regimen ?  ? ?Today (09/23/2021): Kimberly Diaz is here for a follow up on diabetes..  She has been checking multiple times a day  through the CGM.  The patient has  had hypoglycemic episodes since the last clinic visit. ? ? ?She is working on taking prandial insulin before she eats rather after eating  ?Denies nausea, vomiting or diarrhea  ? ?LMP 1/6th ,2023 ?She is currently 10 weeks  ?10/18th, 2023 ? ?HOME DIABETES REGIMEN:  ?Metformin XR 750 mg, BID  ?Semglee 50 units daily  ?Humalog 22/22/24  ?CF: Humalog (BG-130/10)  ? ? ? ? ? ?CONTINUOUS GLUCOSE MONITORING RECORD INTERPRETATION   ? ?Dates of Recording: 3/8-3/21/2023 ? ?Sensor description: freestyle libre  ? ?Results statistics: ?  ?CGM use % of time 24  ?Average and SD 170/30.6  ?Time in range  56 %  ?% Time Above 180 36  ?% Time  above 250 7  ?% Time Below target 1  ? ? ? ?Glycemic patterns summary: Limited data with 24% use, BG's within goal at night  ? ?Hyperglycemic episodes  Post prandial  ? ?Hypoglycemic episodes occurred N/A ? ?Overnight periods: trends down  ? ? ? ? ?DIABETIC COMPLICATIONS: ?Microvascular complications:  ?  ?Denies: CKD, neuropathy  ?Last eye exam: Completed 03/2019 ?  ?Macrovascular complications:  ?  ?Denies: CAD, PVD, CVA ? ? ?HISTORY:  ?Past Medical History:  ?Past Medical History:  ?Diagnosis Date  ? Anxiety   ? Chronic back pain   ? muscle stiffness  ? Diabetes mellitus without complication ([place])   ? metformin  ? Fatty liver   ? Hx of varicella   ? PCOS (polycystic ovarian syndrome)   ? possible   ? ?Past Surgical History:  ?Past Surgical History:  ?Procedure Laterality Date  ? CESAREAN SECTION N/A 11/06/2016  ? Procedure: CESAREAN SECTION;  Surgeon: Jerelyn Charles, MD;  Location: [facility];  Service: Obstetrics;  Laterality: N/A;  ? [place] EXTRACTION  2012  ? ?Social History:  reports that she has never smoked. She has never used smokeless tobacco. She reports that she does not drink alcohol and does not use drugs. ?Family History:  ?Family History  ?Problem Relation Age of Onset  ? Hypertension Father   ? Diabetes Father   ? Cancer Maternal Grandfather   ?  Heart disease Maternal Grandfather   ? Cancer Paternal Grandfather   ? Breast cancer Paternal Grandmother   ? ? ? ?HOME MEDICATIONS: ?Allergies as of 09/23/2021   ?No Known Allergies ?  ? ?  ?Medication List  ?  ? ?  ? Accurate as of September 23, 2021  3:11 PM. If you have any questions, ask your nurse or doctor.  ?  ?  ? ?  ? ?STOP taking these medications   ? ?Ozempic (1 MG/DOSE) 4 MG/3ML Sopn ?Generic drug: Semaglutide (1 MG/DOSE) ?Stopped by: Kimberly Sciara, MD ?  ? ?  ? ?TAKE these medications   ? ?cholecalciferol 25 MCG (1000 UNIT) tablet ?Commonly known as: VITAMIN D3 ?Take 3,000 Units by mouth daily. ?  ?citalopram 40 MG tablet ?Commonly  known as: CELEXA ?TAKE 1 TABLET DAILY ?  ?FreeStyle Libre 2 Sensor Misc ?USE 1 SENSOR AS DIRECTED ?  ?HumaLOG KwikPen 200 UNIT/ML KwikPen ?Generic drug: insulin lispro ?INJECT A MAXIMUM DAILY DOSE OF 100 UNITS ?  ?insulin glargine-yfgn 100 UNIT/ML injection ?Commonly known as: Semglee (yfgn) ?Inject 0.4 mLs (40 Units total) into the skin daily. Insulin pen ?What changed: how much to take ?  ?Insulin Pen Needle 31G X 5 MM Misc ?1 Device by Does not apply route as directed. ?  ?metFORMIN 750 MG 24 hr tablet ?Commonly known as: GLUCOPHAGE-XR ?Take 2 tablets (1,500 mg total) by mouth daily. ?  ?ONE TOUCH COMBO PACK Misc ?by Does not apply route. ?  ?onetouch ultrasoft lancets ?Use as instructed to test blood sugar 2 times daily E11.9 ?  ?OneTouch Delica Plus Lancing Misc ?by Does not apply route. ?  ?OneTouch Ultra test strip ?Generic drug: glucose blood ?Use as instructed to test blood sugar 2 times daily E11.9 ?  ?PRE-NATAL PO ?Take by mouth. ?  ?progesterone 200 MG capsule ?Commonly known as: PROMETRIUM ?Take 1 capsule by mouth daily at 6 (six) AM. ?  ? ?  ? ? ? ?OBJECTIVE:  ? ?Vital Signs: BP 126/70 (BP Location: Left Arm, Patient Position: Sitting, Cuff Size: Large)   Pulse 60   Ht 5\' 4"  (1.626 m)   Wt 210 lb (95.3 kg)   SpO2 96%   BMI 36.05 kg/m?   ?Wt Readings from Last 3 Encounters:  ?09/23/21 210 lb (95.3 kg)  ?05/12/21 205 lb 9.6 oz (93.3 kg)  ?04/30/21 205 lb (93 kg)  ? ? ? ?Exam: ?General: Pt appears well and is in NAD  ?Lungs: Clear with good BS bilat with no rales, rhonchi, or wheezes  ?Heart: RRR with normal S1 and S2 and no gallops; no murmurs; no rub  ?Abdomen: Normoactive bowel sounds, soft, nontender, without masses or organomegaly palpable  ?Extremities: No pretibial edema.   ?Neuro: MS is good with appropriate affect, pt is alert and Ox3  ? ?  ?  ?  DM foot exam: 01/24/2021 ?   ?The skin of the feet is intact without sores or ulcerations. ?The pedal pulses are 2+ on right and 2+ on left. ?The  sensation is intact to a screening 5.07, 10 gram monofilament bilaterally ? ? ? ?DATA REVIEWED: ? ?Lab Results  ?Component Value Date  ? HGBA1C 6.8 (A) 09/23/2021  ? HGBA1C 6.1 (A) 04/30/2021  ? HGBA1C 7.6 (A) 01/24/2021  ? ?Lab Results  ?Component Value Date  ? MICROALBUR 25.8 (H) 11/12/2020  ? [place] 91 05/12/2021  ? CREATININE 0.56 05/12/2021  ? ?Lab Results  ?Component Value Date  ? MICRALBCREAT 20.3  11/12/2020  ? ? ? ?Lab Results  ?Component Value Date  ? CHOL 155 05/12/2021  ? HDL 38.20 (L) 05/12/2021  ? [place] 91 05/12/2021  ? LDLDIRECT 111.0 11/12/2020  ? TRIG 128.0 05/12/2021  ? CHOLHDL 4 05/12/2021  ?     ? ? ?ASSESSMENT / PLAN / RECOMMENDATIONS:  ? ?1) Type 2 Diabetes Mellitus, Optimally controlled, Without complications - Most recent A1c of 6.8 %. Goal A1c < 6.5 %.   ? ? ?-Limited data on CGM reading with 24% use, it also appears that she is getting falls high and low reading at times.  I have asked the patient to verify hypoglycemic episode on CGM with a fingerstick ?- In office BG 127 mg/dL and CGM reading 146 mg/dL  ?-She continues to have issues remembering to take prandial insulin before the meal, and at times having to take it postprandial.  This morning she ate breakfast and has not taking prandial insulin yet. ?-We discussed the importance of euglycemia during pregnancy to reduce risk of congenital fetal defects ?-We have reviewed pregnancy glucose goals as below ?TIMING  BLOOD SUGAR GOALS  ?FASTING (before breakfast)  60 - 95  ?BEFORE MEALS LESS THAN 100  ?2 hours AFTER MEAL LESS THAN 120  ? ?-Unfortunately she does feel poorly with BG's less than 100 mg/DL, I have asked her to take 1-2 sips of juice or regular soda when she feels poorly and we will have to gradually reduce her BG's but currently they are at 200 range during the day ?-I am unable to change prandial dose of insulin as there is not enough data with prandial insulin intake ?-I am also going to change her correction scale to  accommodate pregnancy goals ? ?MEDICATIONS: ?-Continue metformin 750 mg XR to  2 tabs daily ?-Continue Semglee 50 units daily  ?-Continue Humalog 22 units with breakfast, 22 units with lunch, and 24 units with suppe

## 2021-09-23 ENCOUNTER — Ambulatory Visit (INDEPENDENT_AMBULATORY_CARE_PROVIDER_SITE_OTHER): Payer: BC Managed Care – PPO | Admitting: Internal Medicine

## 2021-09-23 ENCOUNTER — Encounter: Payer: Self-pay | Admitting: Internal Medicine

## 2021-09-23 ENCOUNTER — Other Ambulatory Visit: Payer: Self-pay

## 2021-09-23 VITALS — BP 126/70 | HR 60 | Ht 64.0 in | Wt 210.0 lb

## 2021-09-23 DIAGNOSIS — E119 Type 2 diabetes mellitus without complications: Secondary | ICD-10-CM | POA: Insufficient documentation

## 2021-09-23 DIAGNOSIS — O24311 Unspecified pre-existing diabetes mellitus in pregnancy, first trimester: Secondary | ICD-10-CM | POA: Diagnosis not present

## 2021-09-23 DIAGNOSIS — Z794 Long term (current) use of insulin: Secondary | ICD-10-CM | POA: Diagnosis not present

## 2021-09-23 DIAGNOSIS — R739 Hyperglycemia, unspecified: Secondary | ICD-10-CM

## 2021-09-23 LAB — POCT GLUCOSE (DEVICE FOR HOME USE): Glucose Fasting, POC: 127 mg/dL — AB (ref 70–99)

## 2021-09-23 LAB — POCT GLYCOSYLATED HEMOGLOBIN (HGB A1C): Hemoglobin A1C: 6.8 % — AB (ref 4.0–5.6)

## 2021-09-23 NOTE — Patient Instructions (Addendum)
-   Continue Metformin 750 mg, 2 tablets a day ?- Continue  Semglee 50  units daily  ?- Humalog 22 units with Breakfast, 22 units with Lunch and 24 units with Supper  ? ? ? ?-Humalog correctional insulin: Use the scale below to help guide you:  ? ?Blood sugar before meal Number of units to inject  ?Less than 120 0 unit  ?121 - 130 1 units  ?131 - 140 2 units  ?141 - 150 3 units  ?151 - 160 4 units  ?161 - 170 5 units  ?171 - 180 6 units  ?181 - 190 7 units  ?191 - 200 8 units  ?201 - 210 9 units  ?211 - 220 10 units  ?221 - 230 11 units  ?231 - 240 12 units  ?241 - 250 13 units   ? ? ?TIMING  BLOOD SUGAR GOALS  ?FASTING (before breakfast)  60 - 95  ?BEFORE MEALS LESS THAN 100  ?2 hours AFTER MEAL LESS THAN 120  ? ? ? ?HOW TO TREAT LOW BLOOD SUGARS (Blood sugar LESS THAN 60 MG/DL) ?Please follow the RULE OF 15 for the treatment of hypoglycemia treatment (when your (blood sugars are less than 60 mg/dL)  ? ?STEP 1: Take 15 grams of carbohydrates when your blood sugar is low, which includes:  ?3-4 GLUCOSE TABS  OR ?3-4 OZ OF JUICE OR REGULAR SODA OR ?ONE TUBE OF GLUCOSE GEL   ? ?STEP 2: RECHECK blood sugar in 15 MINUTES ?STEP 3: If your blood sugar is still low at the 15 minute recheck --> then, go back to STEP 1 and treat AGAIN with another 15 grams of carbohydrates. ? ? ? ? ? ?

## 2021-09-24 ENCOUNTER — Encounter: Payer: Self-pay | Admitting: Internal Medicine

## 2021-10-08 DIAGNOSIS — Z3480 Encounter for supervision of other normal pregnancy, unspecified trimester: Secondary | ICD-10-CM | POA: Diagnosis not present

## 2021-10-08 DIAGNOSIS — Z8719 Personal history of other diseases of the digestive system: Secondary | ICD-10-CM | POA: Diagnosis not present

## 2021-10-08 DIAGNOSIS — Z369 Encounter for antenatal screening, unspecified: Secondary | ICD-10-CM | POA: Diagnosis not present

## 2021-10-08 LAB — OB RESULTS CONSOLE RPR: RPR: NONREACTIVE

## 2021-10-08 LAB — OB RESULTS CONSOLE HEPATITIS B SURFACE ANTIGEN: Hepatitis B Surface Ag: NEGATIVE

## 2021-10-08 LAB — OB RESULTS CONSOLE ANTIBODY SCREEN: Antibody Screen: NEGATIVE

## 2021-10-08 LAB — OB RESULTS CONSOLE HIV ANTIBODY (ROUTINE TESTING): HIV: NONREACTIVE

## 2021-10-08 LAB — OB RESULTS CONSOLE RUBELLA ANTIBODY, IGM: Rubella: IMMUNE

## 2021-10-20 ENCOUNTER — Ambulatory Visit (INDEPENDENT_AMBULATORY_CARE_PROVIDER_SITE_OTHER): Payer: BC Managed Care – PPO | Admitting: Internal Medicine

## 2021-10-20 ENCOUNTER — Encounter: Payer: Self-pay | Admitting: Internal Medicine

## 2021-10-20 VITALS — BP 120/76 | HR 86 | Ht 64.0 in | Wt 215.4 lb

## 2021-10-20 DIAGNOSIS — O24311 Unspecified pre-existing diabetes mellitus in pregnancy, first trimester: Secondary | ICD-10-CM

## 2021-10-20 DIAGNOSIS — E119 Type 2 diabetes mellitus without complications: Secondary | ICD-10-CM

## 2021-10-20 MED ORDER — FREESTYLE LIBRE 3 SENSOR MISC
1.0000 | 3 refills | Status: DC
Start: 2021-10-20 — End: 2023-08-18

## 2021-10-20 NOTE — Progress Notes (Signed)
? ?Name: Kimberly Diaz  ?Age/ Sex: 31 y.o., female   ?MRN/ DOB: [id number], 01/10/1991    ? ?PCP: Sheliah Hatchabori, Katherine E, MD   ?Reason for Endocrinology Evaluation: Type 2 Diabetes Mellitus  ?Initial Endocrine Consultative Visit: 09/06/2019  ? ? ?PATIENT IDENTIFIER: Kimberly Diaz is a 31 y.o. female with a past medical history of T2DM and PCOS. The patient has followed with Endocrinology clinic since 09/06/2019 for consultative assistance with management of her diabetes. ? ?DIABETIC HISTORY:  ?Kimberly Diaz was diagnosed with DM in 2016, she has been on metformin since her diagnosis. She has required insulin during 3rd trimester in 2018. Her hemoglobin A1c has ranged from 5.8% in 2018, peaking at 9.0% in 2019. ? ?On her initial visit to our clinic her A1c was 9.8% she was on metformin only, we started her on Glyburide and gradual titration of the dose.  ? ?She was switched to MDI regimen based on OB recommendations 07/2020 ?Ozempic started 01/2021 ? ?Pt has a 2 yr old daughter at home, she is planning on conceiving in the near future ?SUBJECTIVE:  ? ?During the last visit (09/23/2021): A1c 6.8 % . We continued metformin and adjusted MDI regimen ?  ? ?Today (10/20/2021): Kimberly Diaz is here for a follow up on diabetes..  She has been checking multiple times a day  through the CGM.  The patient has  had hypoglycemic episodes since the last clinic visit. ? ? ?Denies nausea, vomiting or diarrhea  ?LMP 1/6th ,2023 ?She is currently 13.5 weeks of gestation ?10/18th, 2023 ? ? ? ? ?HOME DIABETES REGIMEN:  ?Metformin XR 750 mg, 2 tabs daily ?Semglee 50 units daily  ?Humalog 22/22/24  ?CF: Humalog (BG-110/10)  ? ? ? ?CONTINUOUS GLUCOSE MONITORING RECORD INTERPRETATION   ? ?Dates of Recording: 4/4 - 10/20/2021 ?Sensor description: freestyle libre  ? ?Results statistics: ?  ?CGM use % of time 85  ?Average and SD 133/32.4  ?Time in range 81%  ?% Time Above 180 19  ?% Time above 250 0  ?% Time Below target 0  ? ? ? ?Glycemic patterns  summary:BG's optimal overnight, hyperglycemia noted postprandial  ? ?Hyperglycemic episodes  Post prandial  ? ?Hypoglycemic episodes occurred rarely ? ?Overnight periods: trends down  ? ? ? ? ?DIABETIC COMPLICATIONS: ?Microvascular complications:  ?  ?Denies: CKD, neuropathy  ?Last eye exam: Completed 2022 ?  ?Macrovascular complications:  ?  ?Denies: CAD, PVD, CVA ? ? ?HISTORY:  ?Past Medical History:  ?Past Medical History:  ?Diagnosis Date  ? Anxiety   ? Chronic back pain   ? muscle stiffness  ? Diabetes mellitus without complication ([facility])   ? metformin  ? Fatty liver   ? Hx of varicella   ? PCOS (polycystic ovarian syndrome)   ? possible   ? ?Past Surgical History:  ?Past Surgical History:  ?Procedure Laterality Date  ? CESAREAN SECTION N/A 11/06/2016  ? Procedure: CESAREAN SECTION;  Surgeon: Marlow Baarsyanna Clark, MD;  [facility];  Service: Obstetrics;  Laterality: N/A;  ? WISDOM TOOTH EXTRACTION  2012  ? ?Social History:  reports that she has never smoked. She has never used smokeless tobacco. She reports that she does not drink alcohol and does not use drugs. ?Family History:  ?Family History  ?Problem Relation Age of Onset  ? Hypertension Father   ? Diabetes Father   ? Cancer Maternal Grandfather   ? Heart disease Maternal Grandfather   ? Cancer Paternal Grandfather   ? Breast cancer Paternal  Grandmother   ? ? ? ?HOME MEDICATIONS: ?Allergies as of 10/20/2021   ?No Known Allergies ?  ? ?  ?Medication List  ?  ? ?  ? Accurate as of October 20, 2021 12:26 PM. If you have any questions, ask your nurse or doctor.  ?  ?  ? ?  ? ?STOP taking these medications   ? ?cholecalciferol 25 MCG (1000 UNIT) tablet ?Commonly known as: VITAMIN D3 ?Stopped by: Scarlette Shorts, MD ?  ?progesterone 200 MG capsule ?Commonly known as: PROMETRIUM ?Stopped by: Scarlette Shorts, MD ?  ? ?  ? ?TAKE these medications   ? ?citalopram 40 MG tablet ?Commonly known as: CELEXA ?TAKE 1 TABLET DAILY ?  ?FreeStyle Libre 3 Sensor  Misc ?1 Device by Does not apply route every 14 (fourteen) days. ?What changed: See the new instructions. ?Changed by: Scarlette Shorts, MD ?  ?HumaLOG KwikPen 200 UNIT/ML KwikPen ?Generic drug: insulin lispro ?INJECT A MAXIMUM DAILY DOSE OF 100 UNITS ?  ?insulin glargine-yfgn 100 UNIT/ML injection ?Commonly known as: Semglee (yfgn) ?Inject 0.4 mLs (40 Units total) into the skin daily. Insulin pen ?What changed: how much to take ?  ?Insulin Pen Needle 31G X 5 MM Misc ?1 Device by Does not apply route as directed. ?  ?metFORMIN 750 MG 24 hr tablet ?Commonly known as: GLUCOPHAGE-XR ?Take 2 tablets (1,500 mg total) by mouth daily. ?  ?ONE TOUCH COMBO PACK Misc ?by Does not apply route. ?  ?onetouch ultrasoft lancets ?Use as instructed to test blood sugar 2 times daily E11.9 ?  ?OneTouch Delica Plus Lancing Misc ?by Does not apply route. ?  ?OneTouch Ultra test strip ?Generic drug: glucose blood ?Use as instructed to test blood sugar 2 times daily E11.9 ?  ?PRE-NATAL PO ?Take by mouth. ?  ? ?  ? ? ? ?OBJECTIVE:  ? ?Vital Signs: BP 120/76 (BP Location: Left Arm, Patient Position: Sitting, Cuff Size: Small)   Pulse 86   Ht 5\' 4"  (1.626 m)   Wt 215 lb 6.4 oz (97.7 kg)   SpO2 99%   BMI 36.97 kg/m?   ?Wt Readings from Last 3 Encounters:  ?10/20/21 215 lb 6.4 oz (97.7 kg)  ?09/23/21 210 lb (95.3 kg)  ?05/12/21 205 lb 9.6 oz (93.3 kg)  ? ? ? ?Exam: ?General: Pt appears well and is in NAD  ?Lungs: Clear with good BS bilat with no rales, rhonchi, or wheezes  ?Heart: RRR with normal S1 and S2 and no gallops; no murmurs; no rub  ?Abdomen: Normoactive bowel sounds, soft, nontender, without masses or organomegaly palpable  ?Extremities: No pretibial edema.   ?Neuro: MS is good with appropriate affect, pt is alert and Ox3  ? ?  ?  ?  DM foot exam: 01/24/2021 ?   ?The skin of the feet is intact without sores or ulcerations. ?The pedal pulses are 2+ on right and 2+ on left. ?The sensation is intact to a screening 5.07, 10 gram  monofilament bilaterally ? ? ? ?DATA REVIEWED: ? ?Lab Results  ?Component Value Date  ? HGBA1C 6.8 (A) 09/23/2021  ? HGBA1C 6.1 (A) 04/30/2021  ? HGBA1C 7.6 (A) 01/24/2021  ? ?Lab Results  ?Component Value Date  ? MICROALBUR 25.8 (H) 11/12/2020  ? LDLCALC 91 05/12/2021  ? CREATININE 0.56 05/12/2021  ? ?Lab Results  ?Component Value Date  ? MICRALBCREAT 20.3 11/12/2020  ? ? ? ?Lab Results  ?Component Value Date  ? CHOL 155 05/12/2021  ?  HDL 38.20 (L) 05/12/2021  ? LDLCALC 91 05/12/2021  ? LDLDIRECT 111.0 11/12/2020  ? TRIG 128.0 05/12/2021  ? CHOLHDL 4 05/12/2021  ?     ? ? ?ASSESSMENT / PLAN / RECOMMENDATIONS:  ? ?1) Type 2 Diabetes Mellitus, Optimally controlled, Without complications - Most recent A1c of 6.3 %. Goal A1c < 6.5 %.   ? ? ?-Praised the patient improved glycemic control with an A1c at goal at 6.3% ?-In review of her CGM data, the patient has optimal fasting BG's but has postprandial hyperglycemia ?-Will increase prandial dose of insulin as below ? ? ? ?-We have reviewed pregnancy glucose goals as below ?TIMING  BLOOD SUGAR GOALS  ?FASTING (before breakfast)  60 - 95  ?BEFORE MEALS LESS THAN 100  ?2 hours AFTER MEAL LESS THAN 120  ? ? ?MEDICATIONS: ?-Continue metformin 750 mg XR to  2 tabs daily ?-Continue Semglee 50 units daily  ?-Change Humalog 22 units with breakfast, 26 units with lunch, and 28 units with supper ?-Correction factor: Humalog ( BG -110/10)  ? ? ?EDUCATION / INSTRUCTIONS: ?BG monitoring instructions: Patient is instructed to check her blood sugars 7 times a day, before each meal, 2 hours postprandial, and bedtime ?Call [facility] if: BG persistently < 70 ?I reviewed the Rule of 15 for the treatment of hypoglycemia in detail with the patient. Literature supplied. ? ? ? ? ?2) Diabetic complications:  ?Eye: Does not have known diabetic retinopathy.  ?Neuro/ Feet: Does not have known diabetic peripheral neuropathy .  ?Renal: Patient does not have known baseline CKD.   ? ? ? ? ? ?F/U in 8 weeks ? ? ?Signed electronically by: ?Abby Raelyn Mora, MD ? ?[facility]  ?[facility] ?[address] ?[place], [place] [address] ?Phone: [phone]

## 2021-10-20 NOTE — Patient Instructions (Addendum)
-   Please get the "Skin-Tac liquid " from [place] and apply it to the arm before you apply the sensor ? ? ?- Continue Metformin 750 mg, 2 tablets a day ?- Continue  Semglee 50  units daily  ?- Humalog 22 units with Breakfast, 26 units with Lunch and 28 units with Supper  ? ? ? ?-Humalog correctional insulin: Use the scale below to help guide you:  ? ?Blood sugar before meal Number of units to inject  ?Less than 120 0 unit  ?121 - 130 1 units  ?131 - 140 2 units  ?141 - 150 3 units  ?151 - 160 4 units  ?161 - 170 5 units  ?171 - 180 6 units  ?181 - 190 7 units  ?191 - 200 8 units  ?201 - 210 9 units  ?211 - 220 10 units  ?221 - 230 11 units  ?231 - 240 12 units  ?241 - 250 13 units   ? ? ?TIMING  BLOOD SUGAR GOALS  ?FASTING (before breakfast)  60 - 95  ?BEFORE MEALS LESS THAN 100  ?2 hours AFTER MEAL LESS THAN 120  ? ? ? ?HOW TO TREAT LOW BLOOD SUGARS (Blood sugar LESS THAN 60 MG/DL) ?Please follow the RULE OF 15 for the treatment of hypoglycemia treatment (when your (blood sugars are less than 60 mg/dL)  ? ?STEP 1: Take 15 grams of carbohydrates when your blood sugar is low, which includes:  ?3-4 GLUCOSE TABS  OR ?3-4 OZ OF JUICE OR REGULAR SODA OR ?ONE TUBE OF GLUCOSE GEL   ? ?STEP 2: RECHECK blood sugar in 15 MINUTES ?STEP 3: If your blood sugar is still low at the 15 minute recheck --> then, go back to STEP 1 and treat AGAIN with another 15 grams of carbohydrates. ? ? ? ? ? ?

## 2021-10-21 ENCOUNTER — Encounter: Payer: Self-pay | Admitting: Internal Medicine

## 2021-11-04 DIAGNOSIS — Z3482 Encounter for supervision of other normal pregnancy, second trimester: Secondary | ICD-10-CM | POA: Diagnosis not present

## 2021-11-04 DIAGNOSIS — Z369 Encounter for antenatal screening, unspecified: Secondary | ICD-10-CM | POA: Diagnosis not present

## 2021-11-06 ENCOUNTER — Other Ambulatory Visit: Payer: Self-pay

## 2021-11-10 ENCOUNTER — Ambulatory Visit (INDEPENDENT_AMBULATORY_CARE_PROVIDER_SITE_OTHER): Payer: BC Managed Care – PPO | Admitting: Family Medicine

## 2021-11-10 ENCOUNTER — Encounter: Payer: Self-pay | Admitting: Family Medicine

## 2021-11-10 VITALS — BP 126/82 | HR 90 | Temp 98.8°F | Resp 16 | Ht 64.0 in | Wt 211.6 lb

## 2021-11-10 DIAGNOSIS — E119 Type 2 diabetes mellitus without complications: Secondary | ICD-10-CM | POA: Diagnosis not present

## 2021-11-10 NOTE — Assessment & Plan Note (Signed)
Pt has done very well w/ her transition to insulin.  A1C is down to 6.3%.  She is having some symptomatic lows but has a CGM to alert her so she can eat or drink.  UTD on eye exam, microalbumin.  Foot exam done today.  She is absolutely glowing and looks great.  No changes at this time. ?

## 2021-11-10 NOTE — Progress Notes (Signed)
? ?  Subjective:  ? ? Patient ID: Kimberly Diaz, female    DOB: April 10, 1991, 31 y.o.   MRN: [id number] ? ?HPI ?DM- chronic problem, on glargine 50 units daily and humalog sliding scale.  Also on Metformin XR 1500 daily.  Pt is [redacted] weeks pregnant.  UTD on eye exam.  Due foot exam.  Pt reports feeling well.  Pt reports sugars are better controlled now than they were pre-pregnancy.  No CP, SOB, HAs, visual changes.  No abd pain, N/V.  Denies numbness/tingling of feet.  Has intermittent numbness of R hand due to cervical radiculopathy.  Having some symptomatic lows- has CGM at this time.  A1C in March 6.8% and in April 6.3% ? ? ?Review of Systems ?For ROS see HPI  ?   ?Objective:  ? Physical Exam ?Vitals reviewed.  ?Constitutional:   ?   General: She is not in acute distress. ?   Appearance: Normal appearance. She is well-developed. She is obese. She is not ill-appearing.  ?HENT:  ?   Head: Normocephalic and atraumatic.  ?Eyes:  ?   Conjunctiva/sclera: Conjunctivae normal.  ?   Pupils: Pupils are equal, round, and reactive to light.  ?Neck:  ?   Thyroid: No thyromegaly.  ?Cardiovascular:  ?   Rate and Rhythm: Normal rate and regular rhythm.  ?   Pulses: Normal pulses.  ?   Heart sounds: Normal heart sounds. No murmur heard. ?Pulmonary:  ?   Effort: Pulmonary effort is normal. No respiratory distress.  ?   Breath sounds: Normal breath sounds.  ?Abdominal:  ?   General: There is no distension.  ?   Palpations: Abdomen is soft.  ?   Tenderness: There is no abdominal tenderness.  ?Musculoskeletal:  ?   Cervical back: Normal range of motion and neck supple.  ?   Right lower leg: No edema.  ?   Left lower leg: No edema.  ?Lymphadenopathy:  ?   Cervical: No cervical adenopathy.  ?Skin: ?   General: Skin is warm and dry.  ?Neurological:  ?   General: No focal deficit present.  ?   Mental Status: She is alert and oriented to person, place, and time.  ?Psychiatric:     ?   Mood and Affect: Mood normal.     ?   Behavior: Behavior  normal.  ? ? ? ? ? ?   ?Assessment & Plan:  ? ? ?

## 2021-11-10 NOTE — Patient Instructions (Signed)
Schedule your complete physical in November ?No need for labs today- they look great!!! ?[place]!!!! ?Call with any questions or concerns ?CONGRATS AND GOOD LUCK!!! ?

## 2021-11-11 ENCOUNTER — Other Ambulatory Visit: Payer: Self-pay | Admitting: Obstetrics and Gynecology

## 2021-11-11 DIAGNOSIS — Z3689 Encounter for other specified antenatal screening: Secondary | ICD-10-CM

## 2021-11-18 DIAGNOSIS — M25511 Pain in right shoulder: Secondary | ICD-10-CM | POA: Diagnosis not present

## 2021-11-18 DIAGNOSIS — M5411 Radiculopathy, occipito-atlanto-axial region: Secondary | ICD-10-CM | POA: Diagnosis not present

## 2021-11-18 DIAGNOSIS — M9901 Segmental and somatic dysfunction of cervical region: Secondary | ICD-10-CM | POA: Diagnosis not present

## 2021-11-21 ENCOUNTER — Other Ambulatory Visit: Payer: Self-pay | Admitting: Internal Medicine

## 2021-12-02 DIAGNOSIS — Z369 Encounter for antenatal screening, unspecified: Secondary | ICD-10-CM | POA: Diagnosis not present

## 2021-12-09 ENCOUNTER — Ambulatory Visit: Payer: BC Managed Care – PPO | Admitting: *Deleted

## 2021-12-09 ENCOUNTER — Ambulatory Visit: Payer: BC Managed Care – PPO | Attending: Obstetrics and Gynecology

## 2021-12-09 VITALS — BP 127/72 | HR 91

## 2021-12-09 DIAGNOSIS — O24112 Pre-existing diabetes mellitus, type 2, in pregnancy, second trimester: Secondary | ICD-10-CM | POA: Diagnosis not present

## 2021-12-09 DIAGNOSIS — Z3689 Encounter for other specified antenatal screening: Secondary | ICD-10-CM | POA: Diagnosis not present

## 2021-12-10 ENCOUNTER — Other Ambulatory Visit: Payer: Self-pay | Admitting: *Deleted

## 2021-12-10 DIAGNOSIS — Z6835 Body mass index (BMI) 35.0-35.9, adult: Secondary | ICD-10-CM

## 2021-12-10 DIAGNOSIS — O24112 Pre-existing diabetes mellitus, type 2, in pregnancy, second trimester: Secondary | ICD-10-CM

## 2021-12-18 DIAGNOSIS — M9901 Segmental and somatic dysfunction of cervical region: Secondary | ICD-10-CM | POA: Diagnosis not present

## 2021-12-18 DIAGNOSIS — M25511 Pain in right shoulder: Secondary | ICD-10-CM | POA: Diagnosis not present

## 2021-12-18 DIAGNOSIS — M5411 Radiculopathy, occipito-atlanto-axial region: Secondary | ICD-10-CM | POA: Diagnosis not present

## 2021-12-25 DIAGNOSIS — O24112 Pre-existing diabetes mellitus, type 2, in pregnancy, second trimester: Secondary | ICD-10-CM | POA: Diagnosis not present

## 2022-01-02 DIAGNOSIS — Z369 Encounter for antenatal screening, unspecified: Secondary | ICD-10-CM | POA: Diagnosis not present

## 2022-01-02 LAB — HM PAP SMEAR

## 2022-01-07 ENCOUNTER — Encounter: Payer: Self-pay | Admitting: Family Medicine

## 2022-01-08 ENCOUNTER — Ambulatory Visit (INDEPENDENT_AMBULATORY_CARE_PROVIDER_SITE_OTHER): Payer: BC Managed Care – PPO | Admitting: Internal Medicine

## 2022-01-08 ENCOUNTER — Encounter: Payer: Self-pay | Admitting: Internal Medicine

## 2022-01-08 ENCOUNTER — Ambulatory Visit: Payer: BC Managed Care – PPO | Admitting: Internal Medicine

## 2022-01-08 VITALS — BP 118/72 | HR 93 | Ht 64.0 in | Wt 213.0 lb

## 2022-01-08 DIAGNOSIS — O24312 Unspecified pre-existing diabetes mellitus in pregnancy, second trimester: Secondary | ICD-10-CM | POA: Diagnosis not present

## 2022-01-08 DIAGNOSIS — E119 Type 2 diabetes mellitus without complications: Secondary | ICD-10-CM | POA: Diagnosis not present

## 2022-01-08 LAB — POCT GLYCOSYLATED HEMOGLOBIN (HGB A1C): Hemoglobin A1C: 5.4 % (ref 4.0–5.6)

## 2022-01-08 MED ORDER — INSULIN GLARGINE-YFGN 100 UNIT/ML ~~LOC~~ SOLN
55.0000 [IU] | Freq: Every day | SUBCUTANEOUS | 3 refills | Status: DC
Start: 2022-01-08 — End: 2022-02-19

## 2022-01-08 MED ORDER — NOVOLIN R FLEXPEN 100 UNIT/ML IJ SOPN: PEN_INJECTOR | INTRAMUSCULAR | 0 refills | Status: DC

## 2022-01-08 NOTE — Progress Notes (Signed)
Name: Kimberly Diaz  Age/ Sex: 31 y.o., female   MRN/ DOB: [id number], 14-Mar-1991     PCP: Sheliah Hatch, MD   Reason for Endocrinology Evaluation: Type 2 Diabetes Mellitus  Initial Endocrine Consultative Visit: 09/06/2019    PATIENT IDENTIFIER: Kimberly Diaz is a 31 y.o. female with a past medical history of T2DM and PCOS. The patient has followed with Endocrinology clinic since 09/06/2019 for consultative assistance with management of her diabetes.  DIABETIC HISTORY:  Ms. Muscatello was diagnosed with DM in 2016, she has been on metformin since her diagnosis. She has required insulin during 3rd trimester in 2018. Her hemoglobin A1c has ranged from 5.8% in 2018, peaking at 9.0% in 2019.  On her initial visit to our clinic her A1c was 9.8% she was on metformin only, we started her on Glyburide and gradual titration of the dose.   She was switched to MDI regimen based on OB recommendations 07/2020 Ozempic started 01/2021  Pt has a 2 yr old daughter at home, she is planning on conceiving in the near future SUBJECTIVE:   During the last visit (10/20/2021): A1c 6.3 % . We continued metformin and adjusted MDI regimen    Today (01/08/2022): Ms. Bradt is here for a follow up on diabetes..  She has been checking multiple times a day  through the CGM.  The patient has  had hypoglycemic episodes since the last clinic visit.   Denies nausea, vomiting or diarrhea  Denies constipation  LMP 1/6th ,2023 She is currently 25.1 weeks of gestation, expecting a boy  10/18th, 2023     HOME DIABETES REGIMEN:  Metformin XR 750 mg, 2 tabs daily Semglee 50 units daily  Humalog 22/26/28  CF: Humalog (BG-110/10)     CONTINUOUS GLUCOSE MONITORING RECORD INTERPRETATION    Dates of Recording: 6/23-01/08/2022 Sensor description: freestyle libre 3  Results statistics:   CGM use % of time 81  Average and SD 141/30.8  Time in range 83%  % Time Above 180 15  % Time above 250 2  % Time Below  target 0     Glycemic patterns summary:BG's optimal overnight, hyperglycemia noted postprandial   Hyperglycemic episodes  Post prandial   Hypoglycemic episodes occurred n/a  Overnight periods: trends down      DIABETIC COMPLICATIONS: Microvascular complications:    Denies: CKD, neuropathy  Last eye exam: Completed 2022   Macrovascular complications:    Denies: CAD, PVD, CVA   HISTORY:  Past Medical History:  Past Medical History:  Diagnosis Date   Anxiety    Chronic back pain    muscle stiffness   Diabetes mellitus without complication (HCC)    metformin   Fatty liver    Hx of varicella    PCOS (polycystic ovarian syndrome)    possible    Past Surgical History:  Past Surgical History:  Procedure Laterality Date   CESAREAN SECTION N/A 11/06/2016   Procedure: CESAREAN SECTION;  Surgeon: Marlow Baars, MD;  [facility]: [facility];  Service: Obstetrics;  Laterality: N/A;   WISDOM TOOTH EXTRACTION  2012   Social History:  reports that she has never smoked. She has never used smokeless tobacco. She reports that she does not drink alcohol and does not use drugs. Family History:  Family History  Problem Relation Age of Onset   Hypertension Father    Diabetes Father    Cancer Maternal Grandfather    Heart disease Maternal Grandfather    Cancer Paternal Grandfather  Breast cancer Paternal Grandmother      HOME MEDICATIONS: Allergies as of 01/08/2022   No Known Allergies      Medication List        Accurate as of January 08, 2022 11:33 AM. If you have any questions, ask your nurse or doctor.          citalopram 40 MG tablet Commonly known as: CELEXA TAKE 1 TABLET DAILY   FreeStyle Libre 3 Sensor Misc 1 Device by Does not apply route every 14 (fourteen) days.   HumaLOG KwikPen 200 UNIT/ML KwikPen Generic drug: insulin lispro INJECT A MAXIMUM DAILY DOSE OF 100 UNITS   insulin glargine-yfgn 100 UNIT/ML injection Commonly known as: Semglee  (yfgn) Inject 0.4 mLs (40 Units total) into the skin daily. Insulin pen What changed: how much to take   Insulin Pen Needle 31G X 5 MM Misc 1 Device by Does not apply route as directed.   metFORMIN 750 MG 24 hr tablet Commonly known as: GLUCOPHAGE-XR TAKE 2 TABLETS DAILY   ONE TOUCH COMBO PACK Misc by Does not apply route.   onetouch ultrasoft lancets Use as instructed to test blood sugar 2 times daily E11.9   OneTouch Delica Plus Lancing Misc by Does not apply route.   OneTouch Ultra test strip Generic drug: glucose blood Use as instructed to test blood sugar 2 times daily E11.9   PRE-NATAL PO Take by mouth.         OBJECTIVE:   Vital Signs: BP 118/72 (BP Location: Left Arm, Patient Position: Sitting, Cuff Size: Large)   Pulse 93   Ht 5\' 4"  (1.626 m)   Wt 213 lb (96.6 kg)   SpO2 99%   BMI 36.56 kg/m   Wt Readings from Last 3 Encounters:  01/08/22 213 lb (96.6 kg)  11/10/21 211 lb 9.6 oz (96 kg)  10/20/21 215 lb 6.4 oz (97.7 kg)     Exam: General: Pt appears well and is in NAD  Lungs: Clear with good BS bilat with no rales, rhonchi, or wheezes  Heart: RRR with normal S1 and S2 and no gallops; no murmurs; no rub  Abdomen: Normoactive bowel sounds, soft, nontender, without masses or organomegaly palpable  Extremities: No pretibial edema.   Neuro: MS is good with appropriate affect, pt is alert and Ox3         DM foot exam: 01/24/2021    The skin of the feet is intact without sores or ulcerations. The pedal pulses are 2+ on right and 2+ on left. The sensation is intact to a screening 5.07, 10 gram monofilament bilaterally    DATA REVIEWED:  Lab Results  Component Value Date   HGBA1C 5.4 01/08/2022   HGBA1C 6.8 (A) 09/23/2021   HGBA1C 6.1 (A) 04/30/2021   Lab Results  Component Value Date   MICROALBUR 25.8 (H) 11/12/2020   LDLCALC 91 05/12/2021   CREATININE 0.56 05/12/2021   Lab Results  Component Value Date   MICRALBCREAT 20.3 11/12/2020      Lab Results  Component Value Date   CHOL 155 05/12/2021   HDL 38.20 (L) 05/12/2021   LDLCALC 91 05/12/2021   LDLDIRECT 111.0 11/12/2020   TRIG 128.0 05/12/2021   CHOLHDL 4 05/12/2021         ASSESSMENT / PLAN / RECOMMENDATIONS:   1) Type 2 Diabetes Mellitus, Optimally controlled, Without complications - Most recent A1c of 5.4%. Goal A1c < 6.5 %.     -Praised the patient improved glycemic control  with an A1c at goal at 5.4% -In review of her CGM data, the patient has borderline fasting BG's but has postprandial hyperglycemia -She also has noted that with Humalog use her BG trend down short-term but they start to rise within a short period of time, we discussed switching Humalog to regular insulin due to longer half-life, she was advised to take it 20-30 minutes before her meals    -We have reviewed pregnancy glucose goals as below TIMING  BLOOD SUGAR GOALS  FASTING (before breakfast)  60 - 95  BEFORE MEALS LESS THAN 100  2 hours AFTER MEAL LESS THAN 120    MEDICATIONS: -Continue metformin 750 mg XR to  2 tabs daily -Increase Semglee 55 units daily  -Change Humalog to regular insulin 22 units before breakfast, 28 units before lunch, and 28 units before supper -Correction factor: Humalog ( BG -110/10)    EDUCATION / INSTRUCTIONS: BG monitoring instructions: Patient is instructed to check her blood sugars 7 times a day, before each meal, 2 hours postprandial, and bedtime Call [place] [place] [facility] if: BG persistently < 70 I reviewed the Rule of 15 for the treatment of hypoglycemia in detail with the patient. Literature supplied.     2) Diabetic complications:  Eye: Does not have known diabetic retinopathy.  Neuro/ Feet: Does not have known diabetic peripheral neuropathy .  Renal: Patient does not have known baseline CKD.       F/U in 6 weeks   Signed electronically by: Lyndle Herrlich, MD  [facility]  [facility] [address] [place] [place], [place] [address] Phone: [phone] FAX: [phone]   CC: Sheliah Hatch, MD [address] [place] [address] Phone: [phone]  Fax: [phone]  Return to Endocrinology clinic as below: Future Appointments  Date Time Provider Department Center  05/15/2022 10:40 AM Sheliah Hatch, MD [facility]

## 2022-01-08 NOTE — Patient Instructions (Addendum)
-   Keep Up the Good Work ! A1c 5.4 %  - Continue Metformin 750 mg, 2 tablets a day - Increase Semglee 55  units daily  - Switch Humalog to Humulin-R ( Regular insulin )  22 units BEFORE  Breakfast, 28 units Before Lunch and Supper     -Humulin  correctional insulin: Use the scale below to help guide you before each meal   Blood sugar before meal Number of units to inject  Less than 120 0 unit  121 - 130 1 units  131 - 140 2 units  141 - 150 3 units  151 - 160 4 units  161 - 170 5 units  171 - 180 6 units  181 - 190 7 units  191 - 200 8 units  201 - 210 9 units  211 - 220 10 units  221 - 230 11 units  231 - 240 12 units  241 - 250 13 units    HOW TO TREAT LOW BLOOD SUGARS (Blood sugar LESS THAN 60 MG/DL) Please follow the RULE OF 15 for the treatment of hypoglycemia treatment (when your (blood sugars are less than 60 mg/dL)   STEP 1: Take 15 grams of carbohydrates when your blood sugar is low, which includes:  3-4 GLUCOSE TABS  OR 3-4 OZ OF JUICE OR REGULAR SODA OR ONE TUBE OF GLUCOSE GEL    STEP 2: RECHECK blood sugar in 15 MINUTES STEP 3: If your blood sugar is still low at the 15 minute recheck --> then, go back to STEP 1 and treat AGAIN with another 15 grams of carbohydrates.

## 2022-01-20 ENCOUNTER — Ambulatory Visit: Payer: BC Managed Care – PPO

## 2022-01-21 ENCOUNTER — Encounter: Payer: Self-pay | Admitting: Internal Medicine

## 2022-01-21 MED ORDER — NOVOLIN R FLEXPEN 100 UNIT/ML IJ SOPN: PEN_INJECTOR | INTRAMUSCULAR | 0 refills | Status: DC

## 2022-01-21 MED ORDER — ONETOUCH ULTRA VI STRP: 1.0000 | ORAL_STRIP | Freq: Three times a day (TID) | 3 refills | Status: DC

## 2022-01-21 MED ORDER — ONETOUCH ULTRASOFT LANCETS MISC: 1.0000 | Freq: Three times a day (TID) | 3 refills | Status: DC

## 2022-01-26 ENCOUNTER — Other Ambulatory Visit: Payer: Self-pay

## 2022-01-26 MED ORDER — ONETOUCH ULTRA VI STRP: ORAL_STRIP | 3 refills | Status: DC

## 2022-01-26 MED ORDER — ONETOUCH ULTRASOFT LANCETS MISC
1.0000 | Freq: Three times a day (TID) | 3 refills | Status: DC
Start: 2022-01-26 — End: 2022-01-27

## 2022-01-27 ENCOUNTER — Other Ambulatory Visit: Payer: Self-pay

## 2022-01-27 ENCOUNTER — Telehealth: Payer: Self-pay

## 2022-01-27 MED ORDER — ONETOUCH ULTRASOFT LANCETS MISC: 3 refills | Status: DC

## 2022-01-27 MED ORDER — ONETOUCH ULTRA VI STRP: ORAL_STRIP | 3 refills | Status: DC

## 2022-01-27 NOTE — Telephone Encounter (Signed)
Patient advised and verbalized understanding 

## 2022-01-27 NOTE — Telephone Encounter (Signed)
Patient states that her insurance will not cover the Novolin but her sugar have ben controlled on the [place].

## 2022-01-28 DIAGNOSIS — M25511 Pain in right shoulder: Secondary | ICD-10-CM | POA: Diagnosis not present

## 2022-01-28 DIAGNOSIS — M5411 Radiculopathy, occipito-atlanto-axial region: Secondary | ICD-10-CM | POA: Diagnosis not present

## 2022-01-28 DIAGNOSIS — M9901 Segmental and somatic dysfunction of cervical region: Secondary | ICD-10-CM | POA: Diagnosis not present

## 2022-01-30 ENCOUNTER — Encounter: Payer: Self-pay | Admitting: Family Medicine

## 2022-01-30 DIAGNOSIS — O24113 Pre-existing diabetes mellitus, type 2, in pregnancy, third trimester: Secondary | ICD-10-CM | POA: Diagnosis not present

## 2022-01-30 DIAGNOSIS — R030 Elevated blood-pressure reading, without diagnosis of hypertension: Secondary | ICD-10-CM | POA: Diagnosis not present

## 2022-01-30 DIAGNOSIS — Z3A28 28 weeks gestation of pregnancy: Secondary | ICD-10-CM | POA: Diagnosis not present

## 2022-02-03 DIAGNOSIS — Z369 Encounter for antenatal screening, unspecified: Secondary | ICD-10-CM | POA: Diagnosis not present

## 2022-02-04 ENCOUNTER — Other Ambulatory Visit: Payer: Self-pay

## 2022-02-04 DIAGNOSIS — E119 Type 2 diabetes mellitus without complications: Secondary | ICD-10-CM

## 2022-02-04 MED ORDER — ONETOUCH ULTRASOFT LANCETS MISC: 3 refills | Status: AC

## 2022-02-16 DIAGNOSIS — M25511 Pain in right shoulder: Secondary | ICD-10-CM | POA: Diagnosis not present

## 2022-02-16 DIAGNOSIS — M9901 Segmental and somatic dysfunction of cervical region: Secondary | ICD-10-CM | POA: Diagnosis not present

## 2022-02-16 DIAGNOSIS — M5411 Radiculopathy, occipito-atlanto-axial region: Secondary | ICD-10-CM | POA: Diagnosis not present

## 2022-02-17 ENCOUNTER — Encounter: Payer: Self-pay | Admitting: Family Medicine

## 2022-02-17 DIAGNOSIS — Z3480 Encounter for supervision of other normal pregnancy, unspecified trimester: Secondary | ICD-10-CM | POA: Diagnosis not present

## 2022-02-17 DIAGNOSIS — Z369 Encounter for antenatal screening, unspecified: Secondary | ICD-10-CM | POA: Diagnosis not present

## 2022-02-18 ENCOUNTER — Other Ambulatory Visit: Payer: Self-pay | Admitting: Obstetrics and Gynecology

## 2022-02-18 DIAGNOSIS — O24319 Unspecified pre-existing diabetes mellitus in pregnancy, unspecified trimester: Secondary | ICD-10-CM

## 2022-02-19 ENCOUNTER — Encounter: Payer: Self-pay | Admitting: Internal Medicine

## 2022-02-19 ENCOUNTER — Ambulatory Visit (INDEPENDENT_AMBULATORY_CARE_PROVIDER_SITE_OTHER): Payer: BC Managed Care – PPO | Admitting: Internal Medicine

## 2022-02-19 VITALS — BP 120/80 | HR 93 | Ht 64.0 in | Wt 216.0 lb

## 2022-02-19 DIAGNOSIS — O24313 Unspecified pre-existing diabetes mellitus in pregnancy, third trimester: Secondary | ICD-10-CM | POA: Diagnosis not present

## 2022-02-19 MED ORDER — INSULIN GLARGINE-YFGN 100 UNIT/ML ~~LOC~~ SOLN
60.0000 [IU] | Freq: Every day | SUBCUTANEOUS | 3 refills | Status: DC
Start: 2022-02-19 — End: 2022-02-20

## 2022-02-19 MED ORDER — METFORMIN HCL ER 750 MG PO TB24: 1500.0000 mg | ORAL_TABLET | Freq: Every day | ORAL | 3 refills | Status: DC

## 2022-02-19 NOTE — Patient Instructions (Signed)
-   Continue Metformin 750 mg, 2 tablets a day - Increase Semglee 60  units daily  - Increase  Humalog 26 units BEFORE  Breakfast, 32 units Before Lunch and Supper     -Humulin  correctional insulin: Use the scale below to help guide you before each meal   Blood sugar before meal Number of units to inject  Less than 120 0 unit  121 - 130 1 units  131 - 140 2 units  141 - 150 3 units  151 - 160 4 units  161 - 170 5 units  171 - 180 6 units  181 - 190 7 units  191 - 200 8 units  201 - 210 9 units  211 - 220 10 units  221 - 230 11 units  231 - 240 12 units  241 - 250 13 units    HOW TO TREAT LOW BLOOD SUGARS (Blood sugar LESS THAN 60 MG/DL) Please follow the RULE OF 15 for the treatment of hypoglycemia treatment (when your (blood sugars are less than 60 mg/dL)   STEP 1: Take 15 grams of carbohydrates when your blood sugar is low, which includes:  3-4 GLUCOSE TABS  OR 3-4 OZ OF JUICE OR REGULAR SODA OR ONE TUBE OF GLUCOSE GEL    STEP 2: RECHECK blood sugar in 15 MINUTES STEP 3: If your blood sugar is still low at the 15 minute recheck --> then, go back to STEP 1 and treat AGAIN with another 15 grams of carbohydrates.

## 2022-02-19 NOTE — Progress Notes (Signed)
Name: Kimberly Diaz  Age/ Sex: 31 y.o., female   MRN/ DOB: [id number], 1991-03-24     PCP: Sheliah Hatch, MD   Reason for Endocrinology Evaluation: Type 2 Diabetes Mellitus  Initial Endocrine Consultative Visit: 09/06/2019    PATIENT IDENTIFIER: Kimberly Diaz is a 31 y.o. female with a past medical history of T2DM and PCOS. The patient has followed with Endocrinology clinic since 09/06/2019 for consultative assistance with management of her diabetes.  DIABETIC HISTORY:  Kimberly Diaz was diagnosed with DM in 2016, she has been on metformin since her diagnosis. She has required insulin during 3rd trimester in 2018. Her hemoglobin A1c has ranged from 5.8% in 2018, peaking at 9.0% in 2019.  On her initial visit to our clinic her A1c was 9.8% she was on metformin only, we started her on Glyburide and gradual titration of the dose.   She was switched to MDI regimen based on OB recommendations 07/2020 Ozempic started 01/2021  Pt has a 2 yr old daughter at home, she is planning on conceiving in the near future SUBJECTIVE:   During the last visit (10/20/2021): A1c 6.3 % . We continued metformin and adjusted MDI regimen    Today (02/19/2022): Kimberly Diaz is here for a follow up on diabetes..  She has been checking multiple times a day  through the CGM.  The patient has not  had hypoglycemic episodes since the last clinic visit.  She is currently at 31.1 weeks of gestation, expecting a boy She is scheduled for C-section at 68 weeks of gestation She denies any nausea or vomiting Denies any palpitations No constipation or diarrhea     HOME DIABETES REGIMEN:  Metformin XR 750 mg, 2 tabs daily Semglee 55 units daily  Humalog 22/28/28  CF: Humalog (BG-110/10)     CONTINUOUS GLUCOSE MONITORING RECORD INTERPRETATION    Dates of Recording: 8/4 - 02/19/2022 Sensor description: freestyle libre 3  Results statistics:   CGM use % of time 89  Average and SD 138/28.3  Time in range  84  % Time Above 180 16  % Time above 250 0  % Time Below target 0     Glycemic patterns summary:BG's optimal overnight, hyperglycemia noted postprandial   Hyperglycemic episodes  Post prandial   Hypoglycemic episodes occurred n/a  Overnight periods: trends down      DIABETIC COMPLICATIONS: Microvascular complications:    Denies: CKD, neuropathy  Last eye exam: Completed 2022   Macrovascular complications:    Denies: CAD, PVD, CVA   HISTORY:  Past Medical History:  Past Medical History:  Diagnosis Date   Anxiety    Chronic back pain    muscle stiffness   Diabetes mellitus without complication (HCC)    metformin   Fatty liver    Hx of varicella    PCOS (polycystic ovarian syndrome)    possible    Past Surgical History:  Past Surgical History:  Procedure Laterality Date   CESAREAN SECTION N/A 11/06/2016   Procedure: CESAREAN SECTION;  Surgeon: Marlow Baars, MD;  Location: [facility];  Service: Obstetrics;  Laterality: N/A;   WISDOM TOOTH EXTRACTION  2012   Social History:  reports that she has never smoked. She has never used smokeless tobacco. She reports that she does not drink alcohol and does not use drugs. Family History:  Family History  Problem Relation Age of Onset   Hypertension Father    Diabetes Father    Cancer Maternal Grandfather  Heart disease Maternal Grandfather    Cancer Paternal Grandfather    Breast cancer Paternal Grandmother      HOME MEDICATIONS: Allergies as of 02/19/2022   No Known Allergies      Medication List        Accurate as of February 19, 2022  2:28 PM. If you have any questions, ask your nurse or doctor.          STOP taking these medications    NovoLIN R FlexPen ReliOn 100 UNIT/ML KwikPen Generic drug: Insulin Regular Human Stopped by: Scarlette Shorts, MD       TAKE these medications    citalopram 40 MG tablet Commonly known as: CELEXA TAKE 1 TABLET DAILY   FreeStyle Libre 3 Sensor  Misc 1 Device by Does not apply route every 14 (fourteen) days.   insulin glargine-yfgn 100 UNIT/ML injection Commonly known as: Semglee (yfgn) Inject 0.55 mLs (55 Units total) into the skin daily. Insulin pen   Insulin Pen Needle 31G X 5 MM Misc 1 Device by Does not apply route as directed.   metFORMIN 750 MG 24 hr tablet Commonly known as: GLUCOPHAGE-XR TAKE 2 TABLETS DAILY   OneTouch Delica Plus Lancing Misc by Does not apply route.   OneTouch Ultra test strip Generic drug: glucose blood Use as instructed to test blood sugar 2 times daily E11.9   onetouch ultrasoft lancets Use as instructed to test blood sugar 2 times daily E11.9   PRE-NATAL PO Take by mouth.         OBJECTIVE:   Vital Signs: BP 120/80 (BP Location: Left Arm, Patient Position: Sitting, Cuff Size: Large)   Pulse 93   Ht 5\' 4"  (1.626 m)   Wt 216 lb (98 kg)   SpO2 97%   BMI 37.08 kg/m   Wt Readings from Last 3 Encounters:  02/19/22 216 lb (98 kg)  01/08/22 213 lb (96.6 kg)  11/10/21 211 lb 9.6 oz (96 kg)     Exam: General: Pt appears well and is in NAD  Lungs: Clear with good BS bilat with no rales, rhonchi, or wheezes  Heart: RRR with normal S1 and S2 and no gallops; no murmurs; no rub  Abdomen: Gravid uterus  Extremities: No pretibial edema.   Neuro: MS is good with appropriate affect, pt is alert and Ox3         DM foot exam: 01/24/2021    The skin of the feet is intact without sores or ulcerations. The pedal pulses are 2+ on right and 2+ on left. The sensation is intact to a screening 5.07, 10 gram monofilament bilaterally    DATA REVIEWED:  Lab Results  Component Value Date   HGBA1C 5.4 01/08/2022   HGBA1C 6.8 (A) 09/23/2021   HGBA1C 6.1 (A) 04/30/2021   Lab Results  Component Value Date   MICROALBUR 25.8 (H) 11/12/2020   LDLCALC 91 05/12/2021   CREATININE 0.56 05/12/2021   Lab Results  Component Value Date   MICRALBCREAT 20.3 11/12/2020     Lab Results   Component Value Date   CHOL 155 05/12/2021   HDL 38.20 (L) 05/12/2021   LDLCALC 91 05/12/2021   LDLDIRECT 111.0 11/12/2020   TRIG 128.0 05/12/2021   CHOLHDL 4 05/12/2021         ASSESSMENT / PLAN / RECOMMENDATIONS:   1) Type 2 Diabetes Mellitus, Optimally controlled, Without complications - Most recent A1c of 5.4%. Goal A1c < 6.5 %.     -In review  of her CGM data, t her fasting BG's have been trending up, will increase basal insulin -She has also been noted with postprandial hyperglycemia, this is improved since her last visit here -We will increase prandial insulin as below I will continue to use correction scale -We discussed her postpartum care and I will plan in transitioning her to oral glycemic agents when possible   -We have reviewed pregnancy glucose goals as below TIMING  BLOOD SUGAR GOALS  FASTING (before breakfast)  60 - 95  BEFORE MEALS LESS THAN 100  2 hours AFTER MEAL LESS THAN 120    MEDICATIONS: -Continue metformin 750 mg XR to  2 tabs daily -Increase Semglee 60 units daily  -Increase  Humalog to regular insulin 26 units before breakfast, 32 units before lunch, and 32 units before supper -Correction factor: Humalog ( BG -110/10)    EDUCATION / INSTRUCTIONS: BG monitoring instructions: Patient is instructed to check her blood sugars 7 times a day, before each meal, 2 hours postprandial, and bedtime Call [place] [facility] if: BG persistently < 70 I reviewed the Rule of 15 for the treatment of hypoglycemia in detail with the patient. Literature supplied.     2) Diabetic complications:  Eye: Does not have known diabetic retinopathy.  Neuro/ Feet: Does not have known diabetic peripheral neuropathy .  Renal: Patient does not have known baseline CKD.       F/U in 2 weeks   Signed electronically by: Lyndle Herrlich, MD  [facility]  [facility] [address] [place] [place], [place] [address] Phone:  [phone] FAX: [phone]   CC: Sheliah Hatch, MD [address] [place] [address] Phone: [phone]  Fax: [phone]  Return to Endocrinology clinic as below: Future Appointments  Date Time Provider Department Center  03/05/2022 11:50 AM Camary Sosa, Konrad Dolores, MD [facility] None  05/15/2022 10:40 AM Sheliah Hatch, MD [facility]

## 2022-02-20 ENCOUNTER — Encounter: Payer: Self-pay | Admitting: Internal Medicine

## 2022-02-20 MED ORDER — INSULIN GLARGINE-YFGN 100 UNIT/ML ~~LOC~~ SOLN
60.0000 [IU] | Freq: Every day | SUBCUTANEOUS | 3 refills | Status: DC
Start: 2022-02-20 — End: 2022-08-12

## 2022-02-20 MED ORDER — INSULIN PEN NEEDLE 31G X 5 MM MISC: 1.0000 | 3 refills | Status: DC

## 2022-02-20 MED ORDER — INSULIN LISPRO (1 UNIT DIAL) 100 UNIT/ML (KWIKPEN)
PEN_INJECTOR | SUBCUTANEOUS | 3 refills | Status: DC
Start: 2022-02-20 — End: 2022-08-12

## 2022-02-20 NOTE — Addendum Note (Signed)
Addended by: Scarlette Shorts on: 02/20/2022 07:21 AM   Modules accepted: Orders

## 2022-03-03 DIAGNOSIS — M9901 Segmental and somatic dysfunction of cervical region: Secondary | ICD-10-CM | POA: Diagnosis not present

## 2022-03-03 DIAGNOSIS — M5411 Radiculopathy, occipito-atlanto-axial region: Secondary | ICD-10-CM | POA: Diagnosis not present

## 2022-03-03 DIAGNOSIS — M25511 Pain in right shoulder: Secondary | ICD-10-CM | POA: Diagnosis not present

## 2022-03-05 ENCOUNTER — Encounter: Payer: Self-pay | Admitting: Internal Medicine

## 2022-03-05 ENCOUNTER — Encounter: Payer: Self-pay | Admitting: Family Medicine

## 2022-03-05 ENCOUNTER — Ambulatory Visit (INDEPENDENT_AMBULATORY_CARE_PROVIDER_SITE_OTHER): Payer: BC Managed Care – PPO | Admitting: Internal Medicine

## 2022-03-05 VITALS — BP 114/72 | HR 74 | Ht 64.0 in | Wt 221.0 lb

## 2022-03-05 DIAGNOSIS — E119 Type 2 diabetes mellitus without complications: Secondary | ICD-10-CM

## 2022-03-05 DIAGNOSIS — Z23 Encounter for immunization: Secondary | ICD-10-CM | POA: Diagnosis not present

## 2022-03-05 DIAGNOSIS — Z3A33 33 weeks gestation of pregnancy: Secondary | ICD-10-CM | POA: Diagnosis not present

## 2022-03-05 LAB — POCT GLYCOSYLATED HEMOGLOBIN (HGB A1C): Hemoglobin A1C: 5.6 % (ref 4.0–5.6)

## 2022-03-05 NOTE — Patient Instructions (Addendum)
-   Continue Metformin 750 mg, 2 tablets a day - Increase Semglee 62  units daily  - Humalog 26 units BEFORE  Breakfast, 34 units Before Lunch and 32 Supper   -Humulin  correctional insulin: Use the scale below to help guide you before each meal   Blood sugar before meal Number of units to inject  Less than 110 0 unit  111 - 120 1 units  121 - 130 2 units  131 - 140 3 units  141 - 150 4 units  151 - 160 5 units  161 - 170 6 units  171 - 180 7 units  181 - 190 8 units  191 - 200 9 units   HOW TO TREAT LOW BLOOD SUGARS (Blood sugar LESS THAN 60 MG/DL) Please follow the RULE OF 15 for the treatment of hypoglycemia treatment (when your (blood sugars are less than 60 mg/dL)   STEP 1: Take 15 grams of carbohydrates when your blood sugar is low, which includes:  3-4 GLUCOSE TABS  OR 3-4 OZ OF JUICE OR REGULAR SODA OR ONE TUBE OF GLUCOSE GEL    STEP 2: RECHECK blood sugar in 15 MINUTES STEP 3: If your blood sugar is still low at the 15 minute recheck --> then, go back to STEP 1 and treat AGAIN with another 15 grams of carbohydrates.

## 2022-03-05 NOTE — Progress Notes (Signed)
Name: Kimberly Diaz  Age/ Sex: 31 y.o., female   MRN/ DOB: [id number], 1991/06/27     PCP: Sheliah Hatch, MD   Reason for Endocrinology Evaluation: Type 2 Diabetes Mellitus  Initial Endocrine Consultative Visit: 09/06/2019    PATIENT IDENTIFIER: Kimberly Diaz is a 31 y.o. female with a past medical history of T2DM and PCOS. The patient has followed with Endocrinology clinic since 09/06/2019 for consultative assistance with management of her diabetes.  DIABETIC HISTORY:  Kimberly Diaz was diagnosed with DM in 2016, she has been on metformin since her diagnosis. She has required insulin during 3rd trimester in 2018. Her hemoglobin A1c has ranged from 5.8% in 2018, peaking at 9.0% in 2019.  On her initial visit to our clinic her A1c was 9.8% she was on metformin only, we started her on Glyburide and gradual titration of the dose.   She was switched to MDI regimen based on OB recommendations 07/2020 Ozempic started 01/2021  Kimberly Diaz has a 2 yr old daughter at home, she is planning on conceiving in the near future SUBJECTIVE:   During the last visit (02/19/2022): A1c 6.3 % . We continued metformin and adjusted MDI regimen    Today (03/05/2022): Kimberly Diaz is here for a follow up on diabetes..  She has been checking multiple times a day  through the CGM.  The patient has not  had hypoglycemic episodes since the last clinic visit.  She is currently at 33.1 weeks of gestation, expecting a boy, he is large  She is scheduled for C-section at 51 weeks of gestation 04/15/2022 She denies any nausea or vomiting Denies any palpitations No constipation or diarrhea     HOME DIABETES REGIMEN:  Metformin XR 750 mg, 2 tabs daily Semglee 60 units daily  Humalog 26/32/32 CF: Humalog (BG-110/10)     CONTINUOUS GLUCOSE MONITORING RECORD INTERPRETATION    Dates of Recording: 8/18-8/31/2023 Sensor description: freestyle libre 3  Results statistics:   CGM use % of time 96  Average and SD  126/32.0  Time in range 87  % Time Above 180 11  % Time above 250 0  % Time Below target 2     Glycemic patterns summary:BG's optimal overnight, hyperglycemia noted postprandial   Hyperglycemic episodes  Post prandial   Hypoglycemic episodes occurred after supper  Overnight periods: trends down      DIABETIC COMPLICATIONS: Microvascular complications:    Denies: CKD, neuropathy  Last eye exam: Completed 2022   Macrovascular complications:    Denies: CAD, PVD, CVA   HISTORY:  Past Medical History:  Past Medical History:  Diagnosis Date   Anxiety    Chronic back pain    muscle stiffness   Diabetes mellitus without complication (HCC)    metformin   Fatty liver    Hx of varicella    PCOS (polycystic ovarian syndrome)    possible    Past Surgical History:  Past Surgical History:  Procedure Laterality Date   CESAREAN SECTION N/A 11/06/2016   Procedure: CESAREAN SECTION;  Surgeon: Marlow Baars, MD;  [facility]: [facility];  Service: Obstetrics;  Laterality: N/A;   WISDOM TOOTH EXTRACTION  2012   Social History:  reports that she has never smoked. She has never used smokeless tobacco. She reports that she does not drink alcohol and does not use drugs. Family History:  Family History  Problem Relation Age of Onset   Hypertension Father    Diabetes Father    Cancer Maternal Grandfather  Heart disease Maternal Grandfather    Cancer Paternal Grandfather    Breast cancer Paternal Grandmother      HOME MEDICATIONS: Allergies as of 03/05/2022   No Known Allergies      Medication List        Accurate as of March 05, 2022 11:59 AM. If you have any questions, ask your nurse or doctor.          citalopram 40 MG tablet Commonly known as: CELEXA TAKE 1 TABLET DAILY   FreeStyle Libre 3 Sensor Misc 1 Device by Does not apply route every 14 (fourteen) days.   insulin glargine-yfgn 100 UNIT/ML injection Commonly known as: Semglee (yfgn) Inject 0.6  mLs (60 Units total) into the skin daily. Insulin pen   insulin lispro 100 UNIT/ML KwikPen Commonly known as: HumaLOG KwikPen Max daily 110 units   Insulin Pen Needle 31G X 5 MM Misc 1 Device by Does not apply route as directed.   metFORMIN 750 MG 24 hr tablet Commonly known as: GLUCOPHAGE-XR Take 2 tablets (1,500 mg total) by mouth daily.   OneTouch Delica Plus Lancing Misc by Does not apply route.   OneTouch Ultra test strip Generic drug: glucose blood Use as instructed to test blood sugar 2 times daily E11.9   onetouch ultrasoft lancets Use as instructed to test blood sugar 2 times daily E11.9   PRE-NATAL PO Take by mouth.         OBJECTIVE:   Vital Signs: BP 114/72 (BP Location: Left Arm, Patient Position: Sitting, Cuff Size: Small)   Pulse 74   Ht 5\' 4"  (1.626 m)   Wt 221 lb (100.2 kg)   SpO2 99%   BMI 37.93 kg/m   Wt Readings from Last 3 Encounters:  03/05/22 221 lb (100.2 kg)  02/19/22 216 lb (98 kg)  01/08/22 213 lb (96.6 kg)     Exam: General: Kimberly Diaz appears well and is in NAD  Lungs: Clear with good BS bilat with no rales, rhonchi, or wheezes  Heart: RRR   Extremities: No pretibial edema.   Neuro: MS is good with appropriate affect, Kimberly Diaz is alert and Ox3         DM foot exam: 03/05/2022    The skin of the feet is intact without sores or ulcerations. The pedal pulses are 2+ on right and 2+ on left. The sensation is intact to a screening 5.07, 10 gram monofilament bilaterally    DATA REVIEWED:  Lab Results  Component Value Date   HGBA1C 5.4 01/08/2022   HGBA1C 6.8 (A) 09/23/2021   HGBA1C 6.1 (A) 04/30/2021   Lab Results  Component Value Date   MICROALBUR 25.8 (H) 11/12/2020   LDLCALC 91 05/12/2021   CREATININE 0.56 05/12/2021   Lab Results  Component Value Date   MICRALBCREAT 20.3 11/12/2020     Lab Results  Component Value Date   CHOL 155 05/12/2021   HDL 38.20 (L) 05/12/2021   LDLCALC 91 05/12/2021   LDLDIRECT 111.0  11/12/2020   TRIG 128.0 05/12/2021   CHOLHDL 4 05/12/2021         ASSESSMENT / PLAN / RECOMMENDATIONS:   1) Type 2 Diabetes Mellitus, Optimally controlled, Without complications - Most recent A1c of 5.6%. Goal A1c < 6.5 %.    -A1c remains at goal -Patient endorses hypoglycemia at the end of the day due to decreased appetite, but in reviewing the past week this happened once, but she has been noted with hypoglycemia at lunchtime, this is  due to insulin carbohydrate mismatch.  She was advised to stay consistent with CHO intake per meal -I will slightly adjust her basal insulin, as well as her prandial dose of insulin with lunch as well as correction scale -Unfortunately insurance company did not cover regular insulin   -We have reviewed pregnancy glucose goals as below TIMING  BLOOD SUGAR GOALS  FASTING (before breakfast)  60 - 95  BEFORE MEALS LESS THAN 100  2 hours AFTER MEAL LESS THAN 120    MEDICATIONS: -Continue metformin 750 mg XR to  2 tabs daily -Increase Semglee 62 units daily  -Increase  Humalog 26 units before breakfast, 34 units before lunch, and 32 units before supper -Correction factor: Humalog ( BG -100/10)    EDUCATION / INSTRUCTIONS: BG monitoring instructions: Patient is instructed to check her blood sugars 7 times a day, before each meal, 2 hours postprandial, and bedtime Call [facility] if: BG persistently < 70 I reviewed the Rule of 15 for the treatment of hypoglycemia in detail with the patient. Literature supplied.     2) Diabetic complications:  Eye: Does not have known diabetic retinopathy.  Neuro/ Feet: Does not have known diabetic peripheral neuropathy .  Renal: Patient does not have known baseline CKD.       F/U in 1 week   Signed electronically by: Lyndle Herrlich, MD  [facility] Endocrinology  [facility] [address] [place] [place], [place] [address] Phone: [phone] FAX:  [phone]   CC: Sheliah Hatch, MD [address] [place] [address] Phone: [phone]  Fax: [phone]  Return to Endocrinology clinic as below: Future Appointments  Date Time Provider Department Center  05/15/2022 10:40 AM Sheliah Hatch, MD [facility]

## 2022-03-06 ENCOUNTER — Encounter: Payer: Self-pay | Admitting: Internal Medicine

## 2022-03-10 DIAGNOSIS — Z3A33 33 weeks gestation of pregnancy: Secondary | ICD-10-CM | POA: Diagnosis not present

## 2022-03-10 DIAGNOSIS — E119 Type 2 diabetes mellitus without complications: Secondary | ICD-10-CM | POA: Diagnosis not present

## 2022-03-11 NOTE — Progress Notes (Signed)
Name: Kimberly Diaz  Age/ Sex: 31 y.o., female   MRN/ DOB: [id number], June 27, 1991     PCP: Sheliah Hatch, MD   Reason for Endocrinology Evaluation: Type 2 Diabetes Mellitus  Initial Endocrine Consultative Visit: 09/06/2019    PATIENT IDENTIFIER: Kimberly Diaz is a 31 y.o. female with a past medical history of T2DM and PCOS. The patient has followed with Endocrinology clinic since 09/06/2019 for consultative assistance with management of her diabetes.  DIABETIC HISTORY:  Kimberly Diaz was diagnosed with DM in 2016, she has been on metformin since her diagnosis. She has required insulin during 3rd trimester in 2018. Her hemoglobin A1c has ranged from 5.8% in 2018, peaking at 9.0% in 2019.  On her initial visit to our clinic her A1c was 9.8% she was on metformin only, we started her on Glyburide and gradual titration of the dose.   She was switched to MDI regimen based on OB recommendations 07/2020 Ozempic started 01/2021  Pt has a 2 yr old daughter at home, she is planning on conceiving in the near future SUBJECTIVE:   During the last visit (03/05/2022): A1c 5.6 % . We continued metformin and adjusted MDI regimen    Today (03/12/2022): Kimberly Diaz is here for a follow up on diabetes..  She has been checking multiple times a day  through the CGM.  The patient has not  had hypoglycemic episodes since the last clinic visit.  She is currently at 34.1 weeks of gestation, expecting a boy, large  in size  She is scheduled for C-section at 56 weeks of gestation 04/15/2022 She denies any nausea or vomiting Denies any palpitations No constipation or diarrhea Denies LE edema      HOME DIABETES REGIMEN:  Metformin XR 750 mg, 2 tabs daily Semglee 62 units daily  Humalog 26/34/32 CF: Humalog (BG-100/10)     CONTINUOUS GLUCOSE MONITORING RECORD INTERPRETATION    Dates of Recording:8/25-03/12/2022 Sensor description: freestyle libre 3  Results statistics:   CGM use % of time 95   Average and SD 126/31.9  Time in range 86  % Time Above 180 11  % Time above 250 0  % Time Below target 3     Glycemic patterns summary:BG's optimal overnight, hyperglycemia noted postprandial   Hyperglycemic episodes  Post prandial   Hypoglycemic episodes occurred  n/a  Overnight periods: trends down      DIABETIC COMPLICATIONS: Microvascular complications:    Denies: CKD, neuropathy  Last eye exam: Completed 2022   Macrovascular complications:    Denies: CAD, PVD, CVA   HISTORY:  Past Medical History:  Past Medical History:  Diagnosis Date   Anxiety    Chronic back pain    muscle stiffness   Diabetes mellitus without complication (HCC)    metformin   Fatty liver    Hx of varicella    PCOS (polycystic ovarian syndrome)    possible    Past Surgical History:  Past Surgical History:  Procedure Laterality Date   CESAREAN SECTION N/A 11/06/2016   Procedure: CESAREAN SECTION;  Surgeon: Marlow Baars, MD;  Location: [facility];  Service: Obstetrics;  Laterality: N/A;   WISDOM TOOTH EXTRACTION  2012   Social History:  reports that she has never smoked. She has never used smokeless tobacco. She reports that she does not drink alcohol and does not use drugs. Family History:  Family History  Problem Relation Age of Onset   Hypertension Father    Diabetes Father  Cancer Maternal Grandfather    Heart disease Maternal Grandfather    Cancer Paternal Grandfather    Breast cancer Paternal Grandmother      HOME MEDICATIONS: Allergies as of 03/12/2022   No Known Allergies      Medication List        Accurate as of March 12, 2022 12:14 PM. If you have any questions, ask your nurse or doctor.          citalopram 40 MG tablet Commonly known as: CELEXA TAKE 1 TABLET DAILY   FreeStyle Libre 3 Sensor Misc 1 Device by Does not apply route every 14 (fourteen) days.   insulin glargine-yfgn 100 UNIT/ML injection Commonly known as: Semglee  (yfgn) Inject 0.6 mLs (60 Units total) into the skin daily. Insulin pen   insulin lispro 100 UNIT/ML KwikPen Commonly known as: HumaLOG KwikPen Max daily 110 units   Insulin Pen Needle 31G X 5 MM Misc 1 Device by Does not apply route as directed.   metFORMIN 750 MG 24 hr tablet Commonly known as: GLUCOPHAGE-XR Take 2 tablets (1,500 mg total) by mouth daily.   OneTouch Delica Plus Lancing Misc by Does not apply route.   OneTouch Ultra test strip Generic drug: glucose blood Use as instructed to test blood sugar 2 times daily E11.9   onetouch ultrasoft lancets Use as instructed to test blood sugar 2 times daily E11.9   PRE-NATAL PO Take by mouth.         OBJECTIVE:   Vital Signs: BP (!) 138/92 (BP Location: Left Arm, Patient Position: Sitting, Cuff Size: Normal)   Pulse 91   Ht 5\' 4"  (1.626 m)   Wt 224 lb 3.2 oz (101.7 kg)   SpO2 98%   BMI 38.48 kg/m   Wt Readings from Last 3 Encounters:  03/12/22 224 lb 3.2 oz (101.7 kg)  03/05/22 221 lb (100.2 kg)  02/19/22 216 lb (98 kg)     Exam: General: Pt appears well and is in NAD  Lungs: Clear with good BS bilat with no rales, rhonchi, or wheezes  Heart: RRR   Extremities: No pretibial edema.   Neuro: MS is good with appropriate affect, pt is alert and Ox3         DM foot exam: 03/05/2022    The skin of the feet is intact without sores or ulcerations. The pedal pulses are 2+ on right and 2+ on left. The sensation is intact to a screening 5.07, 10 gram monofilament bilaterally    DATA REVIEWED:  Lab Results  Component Value Date   HGBA1C 5.6 03/05/2022   HGBA1C 5.4 01/08/2022   HGBA1C 6.8 (A) 09/23/2021   Lab Results  Component Value Date   MICROALBUR 25.8 (H) 11/12/2020   LDLCALC 91 05/12/2021   CREATININE 0.56 05/12/2021   Lab Results  Component Value Date   MICRALBCREAT 20.3 11/12/2020     Lab Results  Component Value Date   CHOL 155 05/12/2021   HDL 38.20 (L) 05/12/2021   LDLCALC 91  05/12/2021   LDLDIRECT 111.0 11/12/2020   TRIG 128.0 05/12/2021   CHOLHDL 4 05/12/2021         ASSESSMENT / PLAN / RECOMMENDATIONS:   1) Type 2 Diabetes Mellitus, Optimally controlled, Without complications - Most recent A1c of 5.6%. Goal A1c < 6.5 %.    -A1c remains at goal, but in reviewing CGM download the patient has been noted with postprandial hyperglycemia, her fasting BG's are trending down to goal -Patient has  been noted with imperfect adherence to prandial insulin intake, she has been noted with occasional snacking, that results in slight increase in her BG readings -I am going to increase her basal insulin, prandial dose with lunch -I am also going to adjust her sensitivity factor -She will also be given a Humalog snack dose if she is going to eat something with starch   -We have reviewed pregnancy glucose goals as below TIMING  BLOOD SUGAR GOALS  FASTING (before breakfast)  60 - 95  BEFORE MEALS LESS THAN 100  2 hours AFTER MEAL LESS THAN 120    MEDICATIONS: -Continue metformin 750 mg XR to  2 tabs daily -Increase Semglee 64 units daily  -Increase  Humalog 26 units before breakfast, 36 units before lunch, and 32 units before supper -Take Humalog 4-6 units with a snack -Correction factor: Humalog ( BG -90/10)    EDUCATION / INSTRUCTIONS: BG monitoring instructions: Patient is instructed to check her blood sugars 7 times a day, before each meal, 2 hours postprandial, and bedtime Call [facility] if: BG persistently < 70 I reviewed the Rule of 15 for the treatment of hypoglycemia in detail with the patient. Literature supplied.     2) Diabetic complications:  Eye: Does not have known diabetic retinopathy.  Neuro/ Feet: Does not have known diabetic peripheral neuropathy .  Renal: Patient does not have known baseline CKD.       F/U in 2week   Signed electronically by: Mack Guise, MD  [facility] Endocrinology  [facility] [address] [place], [place] [address] Phone: [phone] FAX: [phone]   CC: Midge Minium, MD [address] [place] [address] Phone: [phone]  Fax: [phone]  Return to Endocrinology clinic as below: Future Appointments  Date Time Provider [place]  05/15/2022 10:40 AM Midge Minium, MD [facility]

## 2022-03-12 ENCOUNTER — Encounter: Payer: Self-pay | Admitting: Internal Medicine

## 2022-03-12 ENCOUNTER — Ambulatory Visit (INDEPENDENT_AMBULATORY_CARE_PROVIDER_SITE_OTHER): Payer: BC Managed Care – PPO | Admitting: Internal Medicine

## 2022-03-12 VITALS — BP 138/92 | HR 91 | Ht 64.0 in | Wt 224.2 lb

## 2022-03-12 DIAGNOSIS — E119 Type 2 diabetes mellitus without complications: Secondary | ICD-10-CM

## 2022-03-12 DIAGNOSIS — O24313 Unspecified pre-existing diabetes mellitus in pregnancy, third trimester: Secondary | ICD-10-CM

## 2022-03-12 NOTE — Patient Instructions (Addendum)
-   Continue Metformin 750 mg, 2 tablets a day - Increase Semglee 64 units daily  - Humalog 26 units BEFORE  Breakfast, 36 units Before Lunch and 32 Supper  - Humalog 4-6 units with a starchy snack   -Humulin  correctional insulin: Use the scale below to help guide you before each meal   Blood sugar before meal Number of units to inject  Less than 100 0 unit  101 - 110 1 units  111 - 120 2 units  121 - 130 3 units  131 - 140 4 units  141 - 150 5 units  151 - 160 6 units  161 - 170 7 units  171 - 180 8 units  191 - 200 9 units   HOW TO TREAT LOW BLOOD SUGARS (Blood sugar LESS THAN 60 MG/DL) Please follow the RULE OF 15 for the treatment of hypoglycemia treatment (when your (blood sugars are less than 60 mg/dL)   STEP 1: Take 15 grams of carbohydrates when your blood sugar is low, which includes:  3-4 GLUCOSE TABS  OR 3-4 OZ OF JUICE OR REGULAR SODA OR ONE TUBE OF GLUCOSE GEL    STEP 2: RECHECK blood sugar in 15 MINUTES STEP 3: If your blood sugar is still low at the 15 minute recheck --> then, go back to STEP 1 and treat AGAIN with another 15 grams of carbohydrates.

## 2022-03-17 DIAGNOSIS — M5411 Radiculopathy, occipito-atlanto-axial region: Secondary | ICD-10-CM | POA: Diagnosis not present

## 2022-03-17 DIAGNOSIS — M25511 Pain in right shoulder: Secondary | ICD-10-CM | POA: Diagnosis not present

## 2022-03-17 DIAGNOSIS — M9901 Segmental and somatic dysfunction of cervical region: Secondary | ICD-10-CM | POA: Diagnosis not present

## 2022-03-20 DIAGNOSIS — Z3A35 35 weeks gestation of pregnancy: Secondary | ICD-10-CM | POA: Diagnosis not present

## 2022-03-20 DIAGNOSIS — E119 Type 2 diabetes mellitus without complications: Secondary | ICD-10-CM | POA: Diagnosis not present

## 2022-03-24 DIAGNOSIS — O288 Other abnormal findings on antenatal screening of mother: Secondary | ICD-10-CM | POA: Diagnosis not present

## 2022-03-24 DIAGNOSIS — Z3A35 35 weeks gestation of pregnancy: Secondary | ICD-10-CM | POA: Diagnosis not present

## 2022-03-26 DIAGNOSIS — Z3A36 36 weeks gestation of pregnancy: Secondary | ICD-10-CM | POA: Diagnosis not present

## 2022-03-26 DIAGNOSIS — E119 Type 2 diabetes mellitus without complications: Secondary | ICD-10-CM | POA: Diagnosis not present

## 2022-03-26 DIAGNOSIS — Z3480 Encounter for supervision of other normal pregnancy, unspecified trimester: Secondary | ICD-10-CM | POA: Diagnosis not present

## 2022-03-26 DIAGNOSIS — Z369 Encounter for antenatal screening, unspecified: Secondary | ICD-10-CM | POA: Diagnosis not present

## 2022-03-26 LAB — OB RESULTS CONSOLE GBS: GBS: NEGATIVE

## 2022-03-27 ENCOUNTER — Encounter: Payer: Self-pay | Admitting: Internal Medicine

## 2022-03-27 ENCOUNTER — Ambulatory Visit (INDEPENDENT_AMBULATORY_CARE_PROVIDER_SITE_OTHER): Payer: BC Managed Care – PPO | Admitting: Internal Medicine

## 2022-03-27 VITALS — BP 132/84 | HR 80 | Ht 64.0 in | Wt 231.0 lb

## 2022-03-27 DIAGNOSIS — E119 Type 2 diabetes mellitus without complications: Secondary | ICD-10-CM

## 2022-03-27 DIAGNOSIS — O24313 Unspecified pre-existing diabetes mellitus in pregnancy, third trimester: Secondary | ICD-10-CM

## 2022-03-27 LAB — POCT GLYCOSYLATED HEMOGLOBIN (HGB A1C): Hemoglobin A1C: 5.5 % (ref 4.0–5.6)

## 2022-03-27 NOTE — Patient Instructions (Addendum)
-   Continue Metformin 750 mg, 2 tablets a day - Continue Semglee 64 units daily  - Humalog 28 units before Breakfast, 38 units Before Lunch and 34 Supper  - Humalog 4-6 units with a starchy snack   -Humulin  correctional insulin: Use the scale below to help guide you before each meal   Blood sugar before meal Number of units to inject  Less than 100 0 unit  101 - 110 1 units  111 - 120 2 units  121 - 130 3 units  131 - 140 4 units  141 - 150 5 units  151 - 160 6 units  161 - 170 7 units  171 - 180 8 units  191 - 200 9 units    AFTER DELIVERY :  Goals ( Fasting between 70-130) , less than 180 after the meals   Continue Metformin 750 mg , 2 tablets daily  Decrease Semglee 32 units once daily  NO MORE standing humalog dose with each meal but use the scale below if needed  Humalog correctional insulin:  Use the scale below to help guide you before each meal   Blood sugar before meal Number of units to inject  Less than 155 0 unit  156 -  180 1 units  181 -  205 2 units  206 -  230 3 units  231 -  255 4 units  256 -  280 5 units  281 -  305 6 units  306 -  330 7 units  331 -  355 8 units       HOW TO TREAT LOW BLOOD SUGARS (Blood sugar LESS THAN 60 MG/DL) Please follow the RULE OF 15 for the treatment of hypoglycemia treatment (when your (blood sugars are less than 60 mg/dL)   STEP 1: Take 15 grams of carbohydrates when your blood sugar is low, which includes:  3-4 GLUCOSE TABS  OR 3-4 OZ OF JUICE OR REGULAR SODA OR ONE TUBE OF GLUCOSE GEL    STEP 2: RECHECK blood sugar in 15 MINUTES STEP 3: If your blood sugar is still low at the 15 minute recheck --> then, go back to STEP 1 and treat AGAIN with another 15 grams of carbohydrates.

## 2022-03-27 NOTE — Progress Notes (Unsigned)
Name: Kimberly Diaz  Age/ Sex: 31 y.o., female   MRN/ DOB: [id number], Aug 17, 1990     PCP: Sheliah Hatch, MD   Reason for Endocrinology Evaluation: Type 2 Diabetes Mellitus  Initial Endocrine Consultative Visit: 09/06/2019    PATIENT IDENTIFIER: Kimberly Diaz is a 31 y.o. female with a past medical history of T2DM and PCOS. The patient has followed with Endocrinology clinic since 09/06/2019 for consultative assistance with management of her diabetes.  DIABETIC HISTORY:  Kimberly Diaz was diagnosed with DM in 2016, she has been on metformin since her diagnosis. She has required insulin during 3rd trimester in 2018. Her hemoglobin A1c has ranged from 5.8% in 2018, peaking at 9.0% in 2019.  On her initial visit to our clinic her A1c was 9.8% she was on metformin only, we started her on Glyburide and gradual titration of the dose.   She was switched to MDI regimen based on OB recommendations 07/2020 Ozempic started 01/2021 but was discontinued in preparation for pregnancy  Pt has a 57 yr old daughter at home    SUBJECTIVE:   During the last visit (03/12/2022): A1c 5.6 % . We continued metformin and adjusted MDI regimen    Today (03/27/2022): Kimberly Diaz is here for a follow up on diabetes. She has been checking  glucose multiple times a day  through the CGM.  The patient has not  had hypoglycemic episodes since the last clinic visit.  She is currently at 36.2 weeks of gestation, expecting a boy, large  in size  She is scheduled for C-section at 55 weeks of gestation 04/15/2022 She denies any nausea  Denies any palpitations Has occasional abdominal discomfort  No constipation or diarrhea Denies LE edema      HOME DIABETES REGIMEN:  Metformin XR 750 mg, 2 tabs daily Semglee 64 units daily  Humalog 26 units before breakfast, 36 units before lunch, and 32 units before supper Humalog 4-6 units with a snack Correction factor: Humalog ( BG -90/10)      CONTINUOUS GLUCOSE  MONITORING RECORD INTERPRETATION    Dates of Recording:9/9-9/22/2023 Sensor description: freestyle libre 3  Results statistics:   CGM use % of time 96  Average and SD 111/29.6  Time in range 93  % Time Above 180 13  % Time above 250 0  % Time Below target 4     Glycemic patterns summary:BG's optimal overnight, hyperglycemia noted postprandial   Hyperglycemic episodes  Post prandial   Hypoglycemic episodes occurred  n/a  Overnight periods: trends down      DIABETIC COMPLICATIONS: Microvascular complications:    Denies: CKD, neuropathy  Last eye exam: Completed 2022   Macrovascular complications:    Denies: CAD, PVD, CVA   HISTORY:  Past Medical History:  Past Medical History:  Diagnosis Date   Anxiety    Chronic back pain    muscle stiffness   Diabetes mellitus without complication (HCC)    metformin   Fatty liver    Hx of varicella    PCOS (polycystic ovarian syndrome)    possible    Past Surgical History:  Past Surgical History:  Procedure Laterality Date   CESAREAN SECTION N/A 11/06/2016   Procedure: CESAREAN SECTION;  Surgeon: Marlow Baars, MD;  [facility]: [facility];  Service: Obstetrics;  Laterality: N/A;   WISDOM TOOTH EXTRACTION  2012   Social History:  reports that she has never smoked. She has never used smokeless tobacco. She reports that she does not drink  alcohol and does not use drugs. Family History:  Family History  Problem Relation Age of Onset   Hypertension Father    Diabetes Father    Cancer Maternal Grandfather    Heart disease Maternal Grandfather    Cancer Paternal Grandfather    Breast cancer Paternal Grandmother      HOME MEDICATIONS: Allergies as of 03/27/2022   No Known Allergies      Medication List        Accurate as of March 27, 2022  2:13 PM. If you have any questions, ask your nurse or doctor.          citalopram 40 MG tablet Commonly known as: CELEXA TAKE 1 TABLET DAILY   FreeStyle  Libre 3 Sensor Misc 1 Device by Does not apply route every 14 (fourteen) days.   insulin glargine-yfgn 100 UNIT/ML injection Commonly known as: Semglee (yfgn) Inject 0.6 mLs (60 Units total) into the skin daily. Insulin pen   insulin lispro 100 UNIT/ML KwikPen Commonly known as: HumaLOG KwikPen Max daily 110 units   Insulin Pen Needle 31G X 5 MM Misc 1 Device by Does not apply route as directed.   metFORMIN 750 MG 24 hr tablet Commonly known as: GLUCOPHAGE-XR Take 2 tablets (1,500 mg total) by mouth daily.   OneTouch Delica Plus Lancing Misc by Does not apply route.   OneTouch Ultra test strip Generic drug: glucose blood Use as instructed to test blood sugar 2 times daily E11.9   onetouch ultrasoft lancets Use as instructed to test blood sugar 2 times daily E11.9   PRE-NATAL PO Take by mouth.         OBJECTIVE:   Vital Signs:BP 132/84 (BP Location: Left Arm, Patient Position: Sitting, Cuff Size: Small)   Pulse 80   Ht 5\' 4"  (1.626 m)   Wt 231 lb (104.8 kg)   SpO2 95%   BMI 39.65 kg/m   Wt Readings from Last 3 Encounters:  03/12/22 224 lb 3.2 oz (101.7 kg)  03/05/22 221 lb (100.2 kg)  02/19/22 216 lb (98 kg)     Exam: General: Pt appears well and is in NAD  Lungs: Clear with good BS bilat with no rales, rhonchi, or wheezes  Heart: RRR   Extremities: No pretibial edema.   Neuro: MS is good with appropriate affect, pt is alert and Ox3         DM foot exam: 03/05/2022    The skin of the feet is intact without sores or ulcerations. The pedal pulses are 2+ on right and 2+ on left. The sensation is intact to a screening 5.07, 10 gram monofilament bilaterally    DATA REVIEWED:  Lab Results  Component Value Date   HGBA1C 5.6 03/05/2022   HGBA1C 5.4 01/08/2022   HGBA1C 6.8 (A) 09/23/2021   Lab Results  Component Value Date   MICROALBUR 25.8 (H) 11/12/2020   LDLCALC 91 05/12/2021   CREATININE 0.56 05/12/2021   Lab Results  Component Value Date    MICRALBCREAT 20.3 11/12/2020     Lab Results  Component Value Date   CHOL 155 05/12/2021   HDL 38.20 (L) 05/12/2021   LDLCALC 91 05/12/2021   LDLDIRECT 111.0 11/12/2020   TRIG 128.0 05/12/2021   CHOLHDL 4 05/12/2021         ASSESSMENT / PLAN / RECOMMENDATIONS:   1) Type 2 Diabetes Mellitus, Optimally controlled, Without complications - Most recent A1c of 5.5%. Goal A1c < 6.5 %.    -A1c  remains at goal - In reviewing CGM download, fasting BG's are optimal with occasional postprandial hyperglycemia ' - Will make the following changes  - She will also be provided with postpartum regimen as below      MEDICATIONS: -Continue metformin 750 mg XR to  2 tabs daily -Continue Semglee 64 units daily  -Increase  Humalog 28 units before breakfast, 38 units before lunch, and 34 units before supper -Take Humalog 4-6 units with a snack -Correction factor: Humalog ( BG -90/10)    POSTPARTUM REGIMEN: Continue Metformin 750 mg, 2 tablets a day Decrease Semglee 32 units daily  - Humalog per correction scale ( NO more standing dose of humalog with each meal ) ( BG-130/ 25)      EDUCATION / INSTRUCTIONS: BG monitoring instructions: Patient is instructed to check her blood sugars 7 times a day, before each meal, 2 hours postprandial, and bedtime Call [facility] if: BG persistently < 70 I reviewed the Rule of 15 for the treatment of hypoglycemia in detail with the patient. Literature supplied.     2) Diabetic complications:  Eye: Does not have known diabetic retinopathy.  Neuro/ Feet: Does not have known diabetic peripheral neuropathy .  Renal: Patient does not have known baseline CKD.       F/U in 4 months    Signed electronically by: Mack Guise, MD  [facility]  [facility] [address] [place], [place] [address] Phone: [phone] FAX: [phone]   CC: Midge Minium, MD [address]  [place]  [address] Phone: [phone]  Fax: [phone]  Return to Endocrinology clinic as below: Future Appointments  Date Time Provider [place]  03/27/2022  3:00 PM Andalyn Heckstall, Melanie Crazier, MD [facility] None  05/15/2022 10:40 AM Midge Minium, MD [facility]

## 2022-03-30 ENCOUNTER — Encounter: Payer: Self-pay | Admitting: Internal Medicine

## 2022-03-31 DIAGNOSIS — E119 Type 2 diabetes mellitus without complications: Secondary | ICD-10-CM | POA: Diagnosis not present

## 2022-03-31 DIAGNOSIS — Z3A36 36 weeks gestation of pregnancy: Secondary | ICD-10-CM | POA: Diagnosis not present

## 2022-04-01 ENCOUNTER — Encounter (HOSPITAL_COMMUNITY): Payer: Self-pay

## 2022-04-01 NOTE — Patient Instructions (Addendum)
Porshia Blizzard  04/01/2022   Your procedure is scheduled on:  04/15/2022  Arrive at [facility] at Entrance C on [address] at [facility]  and [address]. You are invited to use the FREE valet parking or use the Visitor's parking deck.  Pick up the phone at the desk and dial [phone].  Call this number if you have problems the morning of surgery: [phone]  Remember:   Do not eat food:(After Midnight) Desps de medianoche.  Do not drink clear liquids: (After Midnight) Desps de medianoche.  Take these medicines the morning of surgery with A SIP OF WATER:  Cut bedtime insulin dose in half the nighte before surgery and no metformin the night before or day of surgery   Do not wear jewelry, make-up or nail polish.  Do not wear lotions, powders, or perfumes. Do not wear deodorant.  Do not shave 48 hours prior to surgery.  Do not bring valuables to the hospital.  [facility] is not   responsible for any belongings or valuables brought to the hospital.  Contacts, dentures or bridgework may not be worn into surgery.  Leave suitcase in the car. After surgery it may be brought to your room.  For patients admitted to the hospital, checkout time is 11:00 AM the day of              discharge.      Please read over the following fact sheets that you were given:     Preparing for Surgery

## 2022-04-02 DIAGNOSIS — M25511 Pain in right shoulder: Secondary | ICD-10-CM | POA: Diagnosis not present

## 2022-04-02 DIAGNOSIS — Z3A37 37 weeks gestation of pregnancy: Secondary | ICD-10-CM | POA: Diagnosis not present

## 2022-04-02 DIAGNOSIS — M5411 Radiculopathy, occipito-atlanto-axial region: Secondary | ICD-10-CM | POA: Diagnosis not present

## 2022-04-02 DIAGNOSIS — M9901 Segmental and somatic dysfunction of cervical region: Secondary | ICD-10-CM | POA: Diagnosis not present

## 2022-04-02 DIAGNOSIS — E119 Type 2 diabetes mellitus without complications: Secondary | ICD-10-CM | POA: Diagnosis not present

## 2022-04-09 DIAGNOSIS — Z369 Encounter for antenatal screening, unspecified: Secondary | ICD-10-CM | POA: Diagnosis not present

## 2022-04-10 ENCOUNTER — Ambulatory Visit: Payer: BC Managed Care – PPO | Admitting: Internal Medicine

## 2022-04-12 ENCOUNTER — Encounter (HOSPITAL_COMMUNITY): Payer: Self-pay | Admitting: Obstetrics

## 2022-04-12 ENCOUNTER — Other Ambulatory Visit: Payer: Self-pay

## 2022-04-12 ENCOUNTER — Inpatient Hospital Stay (HOSPITAL_COMMUNITY)
Admission: AD | Admit: 2022-04-12 | Discharge: 2022-04-15 | DRG: 786 | Disposition: A | Discharge status: Home / Self Care | Payer: BC Managed Care – PPO | Attending: Obstetrics and Gynecology | Admitting: Obstetrics and Gynecology

## 2022-04-12 DIAGNOSIS — O368130 Decreased fetal movements, third trimester, not applicable or unspecified: Secondary | ICD-10-CM | POA: Diagnosis not present

## 2022-04-12 DIAGNOSIS — F419 Anxiety disorder, unspecified: Secondary | ICD-10-CM | POA: Diagnosis not present

## 2022-04-12 DIAGNOSIS — O2412 Pre-existing diabetes mellitus, type 2, in childbirth: Secondary | ICD-10-CM | POA: Diagnosis not present

## 2022-04-12 DIAGNOSIS — O34211 Maternal care for low transverse scar from previous cesarean delivery: Secondary | ICD-10-CM | POA: Diagnosis present

## 2022-04-12 DIAGNOSIS — O99344 Other mental disorders complicating childbirth: Secondary | ICD-10-CM | POA: Diagnosis not present

## 2022-04-12 DIAGNOSIS — Z7984 Long term (current) use of oral hypoglycemic drugs: Secondary | ICD-10-CM

## 2022-04-12 DIAGNOSIS — O133 Gestational [pregnancy-induced] hypertension without significant proteinuria, third trimester: Secondary | ICD-10-CM | POA: Diagnosis present

## 2022-04-12 DIAGNOSIS — Z3689 Encounter for other specified antenatal screening: Secondary | ICD-10-CM | POA: Diagnosis not present

## 2022-04-12 DIAGNOSIS — Z794 Long term (current) use of insulin: Secondary | ICD-10-CM

## 2022-04-12 DIAGNOSIS — Z3A38 38 weeks gestation of pregnancy: Secondary | ICD-10-CM | POA: Diagnosis not present

## 2022-04-12 DIAGNOSIS — O24319 Unspecified pre-existing diabetes mellitus in pregnancy, unspecified trimester: Secondary | ICD-10-CM | POA: Diagnosis present

## 2022-04-12 DIAGNOSIS — O2492 Unspecified diabetes mellitus in childbirth: Secondary | ICD-10-CM | POA: Diagnosis not present

## 2022-04-12 DIAGNOSIS — O134 Gestational [pregnancy-induced] hypertension without significant proteinuria, complicating childbirth: Secondary | ICD-10-CM | POA: Diagnosis not present

## 2022-04-12 DIAGNOSIS — E119 Type 2 diabetes mellitus without complications: Secondary | ICD-10-CM | POA: Diagnosis present

## 2022-04-12 LAB — CBC
HCT: 33.5 % — ABNORMAL LOW (ref 36.0–46.0)
Hemoglobin: 11.9 g/dL — ABNORMAL LOW (ref 12.0–15.0)
MCH: 29.5 pg (ref 26.0–34.0)
MCHC: 35.5 g/dL (ref 30.0–36.0)
MCV: 83.1 fL (ref 80.0–100.0)
Platelets: 259 10*3/uL (ref 150–400)
RBC: 4.03 MIL/uL (ref 3.87–5.11)
RDW: 14.2 % (ref 11.5–15.5)
WBC: 9.6 10*3/uL (ref 4.0–10.5)
nRBC: 0 % (ref 0.0–0.2)

## 2022-04-12 LAB — URINALYSIS, ROUTINE W REFLEX MICROSCOPIC
Bilirubin Urine: NEGATIVE
Glucose, UA: NEGATIVE mg/dL
Hgb urine dipstick: NEGATIVE
Ketones, ur: NEGATIVE mg/dL
Leukocytes,Ua: NEGATIVE
Nitrite: NEGATIVE
Protein, ur: NEGATIVE mg/dL
Specific Gravity, Urine: 1.003 — ABNORMAL LOW (ref 1.005–1.030)
pH: 7 (ref 5.0–8.0)

## 2022-04-12 LAB — TYPE AND SCREEN
ABO/RH(D): B POS
Antibody Screen: NEGATIVE

## 2022-04-12 LAB — COMPREHENSIVE METABOLIC PANEL
ALT: 11 U/L (ref 0–44)
AST: 19 U/L (ref 15–41)
Albumin: 2.6 g/dL — ABNORMAL LOW (ref 3.5–5.0)
Alkaline Phosphatase: 91 U/L (ref 38–126)
Anion gap: 8 (ref 5–15)
BUN: 7 mg/dL (ref 6–20)
CO2: 21 mmol/L — ABNORMAL LOW (ref 22–32)
Calcium: 9.1 mg/dL (ref 8.9–10.3)
Chloride: 105 mmol/L (ref 98–111)
Creatinine, Ser: 0.45 mg/dL (ref 0.44–1.00)
GFR, Estimated: >60 mL/min (ref >60)
Glucose, Bld: 106 mg/dL — ABNORMAL HIGH (ref 70–99)
Potassium: 3.9 mmol/L (ref 3.5–5.1)
Sodium: 134 mmol/L — ABNORMAL LOW (ref 135–145)
Total Bilirubin: 0.4 mg/dL (ref 0.3–1.2)
Total Protein: 6.1 g/dL — ABNORMAL LOW (ref 6.5–8.1)

## 2022-04-12 LAB — PROTEIN / CREATININE RATIO, URINE
Creatinine, Urine: 28 mg/dL
Total Protein, Urine: <6 mg/dL

## 2022-04-12 NOTE — MAU Note (Signed)
.  Kimberly Diaz is a 31 y.o. at [redacted]w[redacted]d here in MAU reporting: decreased fetal movement. Reports movement was normal yesterday, but has not felt movement today. Reports some ctx throughout the day today. Denies VB, LOF, HA, visual disturbances, or epigastric pain.   Onset of complaint: today  Pain score: 4 Vitals:   04/12/22 2146  BP: (!) 148/95  Pulse: 83  Resp: 18  Temp: 98.3 F (36.8 C)  SpO2: 98%     FHT:138 Lab orders placed from triage:  UA

## 2022-04-12 NOTE — MAU Provider Note (Signed)
History     [id number]  Arrival date and time: 04/12/22 2137    Chief Complaint  Patient presents with   Decreased Fetal Movement     HPI Kimberly Diaz is a 31 y.o. at [redacted]w[redacted]d who presents for decreased fetal movement. Reports not feeling any fetal movement today prior to coming to MAU. Since arriving to MAU she has felt movement. Had some irregular contractions earlier today. Denies leaking of fluid or vaginal bleeding.  Other than having an elevated BP at 28 weeks she denies any history of hypertension. Denies headache, visual disturbance, or epigastric pain. She's scheduled for a repeat c/section on Wednesday. Last ate at 630 pm (pasta with meat sauce).    OB History     Gravida  2   Para  1   Term  1   Preterm      AB      Living  1      SAB      IAB      Ectopic      Multiple      Live Births              Past Medical History:  Diagnosis Date   Anxiety    Chronic back pain    muscle stiffness   Diabetes mellitus without complication (HCC)    metformin   Fatty liver    Hx of varicella    PCOS (polycystic ovarian syndrome)    possible     Past Surgical History:  Procedure Laterality Date   CESAREAN SECTION N/A 11/06/2016   Procedure: CESAREAN SECTION;  Surgeon: Marlow Baars, MD;  Location: [facility];  Service: Obstetrics;  Laterality: N/A;   EYE SURGERY     WISDOM TOOTH EXTRACTION  07/06/2010    Family History  Problem Relation Age of Onset   Hypertension Father    Diabetes Father    Cancer Maternal Grandfather    Heart disease Maternal Grandfather    Cancer Paternal Grandfather    Breast cancer Paternal Grandmother     No Known Allergies  No current facility-administered medications on file prior to encounter.   Current Outpatient Medications on File Prior to Encounter  Medication Sig Dispense Refill   citalopram (CELEXA) 40 MG tablet TAKE 1 TABLET DAILY (Patient taking differently: Take 40 mg by mouth every evening.)  90 tablet 3   insulin glargine-yfgn (SEMGLEE, YFGN,) 100 UNIT/ML injection Inject 0.6 mLs (60 Units total) into the skin daily. Insulin pen (Patient taking differently: Inject 64 Units into the skin at bedtime. Insulin pen) 60 mL 3   insulin lispro (HUMALOG KWIKPEN) 100 UNIT/ML KwikPen Max daily 110 units (Patient taking differently: 26-36 Units with breakfast, with lunch, and with evening meal.) 60 mL 3   metFORMIN (GLUCOPHAGE-XR) 750 MG 24 hr tablet Take 2 tablets (1,500 mg total) by mouth daily. (Patient taking differently: Take 1,500 mg by mouth every evening.) 180 tablet 3   Prenatal Multivit-Min-Fe-FA (PRE-NATAL PO) Take 1 tablet by mouth every evening.     aspirin EC 81 MG tablet Take 81 mg by mouth every evening. Swallow whole.     calcium carbonate (TUMS EX) 750 MG chewable tablet Chew 2 tablets by mouth 3 (three) times daily as needed for heartburn.     Continuous Blood Gluc Sensor (FREESTYLE LIBRE 3 SENSOR) MISC 1 Device by Does not apply route every 14 (fourteen) days. 6 each 3   glucose blood (ONETOUCH ULTRA) test strip Use as instructed to  test blood sugar 2 times daily E11.9 300 each 3   Insulin Pen Needle 31G X 5 MM MISC 1 Device by Does not apply route as directed. 400 each 3   Lancet Devices (ONETOUCH DELICA PLUS LANCING) MISC by Does not apply route.     Lancets (ONETOUCH ULTRASOFT) lancets Use as instructed to test blood sugar 2 times daily E11.9 300 each 3     ROS Pertinent positives and negative per HPI, all others reviewed and negative  Physical Exam   BP 130/74 (BP Location: Right Arm)   Pulse 81   Temp 98.3 F (36.8 C) (Oral)   Resp 18   Ht 5\' 5"  (1.651 m)   Wt 105.1 kg   SpO2 96%   BMI 38.57 kg/m   Patient Vitals for the past 24 hrs:  BP Temp Temp src Pulse Resp SpO2 Height Weight  04/12/22 2355 -- -- -- -- -- 96 % -- --  04/12/22 2350 -- -- -- -- -- 96 % -- --  04/12/22 2345 130/74 -- -- 81 -- 96 % -- --  04/12/22 2340 (!) 157/87 -- -- 80 -- -- -- --   04/12/22 2335 -- -- -- -- -- 96 % -- --  04/12/22 2330 124/82 -- -- 81 -- 96 % -- --  04/12/22 2325 -- -- -- -- -- 96 % -- --  04/12/22 2320 -- -- -- -- -- 97 % -- --  04/12/22 2315 126/83 -- -- 84 -- 96 % -- --  04/12/22 2310 -- -- -- -- -- 95 % -- --  04/12/22 2305 -- -- -- -- -- 96 % -- --  04/12/22 2300 136/85 -- -- 87 -- 95 % -- --  04/12/22 2255 -- -- -- -- -- 96 % -- --  04/12/22 2250 -- -- -- -- -- 96 % -- --  04/12/22 2245 (!) 141/81 -- -- 76 -- 97 % -- --  04/12/22 2240 -- -- -- -- -- 96 % -- --  04/12/22 2235 -- -- -- -- -- 96 % -- --  04/12/22 2230 (!) 146/84 -- -- 84 -- 96 % -- --  04/12/22 2225 -- -- -- -- -- 95 % -- --  04/12/22 2220 -- -- -- -- -- 95 % -- --  04/12/22 2215 132/86 -- -- 89 -- 96 % -- --  04/12/22 2210 -- -- -- -- -- 95 % -- --  04/12/22 2208 (!) 143/91 -- -- 90 -- -- -- --  04/12/22 2205 -- -- -- -- -- 96 % -- --  04/12/22 2146 (!) 148/95 98.3 F (36.8 C) Oral 83 18 98 % 5\' 5"  (1.651 m) 105.1 kg    Physical Exam Vitals and nursing note reviewed.  Constitutional:      General: She is not in acute distress.    Appearance: Normal appearance.  HENT:     Head: Normocephalic and atraumatic.  Pulmonary:     Effort: Pulmonary effort is normal.  Musculoskeletal:     Right lower leg: 1+ Pitting Edema present.     Left lower leg: 1+ Pitting Edema present.  Skin:    General: Skin is warm and dry.  Neurological:     Mental Status: She is alert.     Deep Tendon Reflexes:     Reflex Scores:      Patellar reflexes are 2+ on the right side and 2+ on the left side.    Comments: No clonus  Psychiatric:  Mood and Affect: Mood normal.        Behavior: Behavior normal.     FHT Baseline 130, moderate variability, 15x15 accels, no decels Toco: Q 10-20 minutes Cat: 1  Labs Results for orders placed or performed during the hospital encounter of 04/12/22 (from the past 24 hour(s))  Urinalysis, Routine w reflex microscopic Urine, Clean Catch      Status: Abnormal   Collection Time: 04/12/22  9:58 PM  Result Value Ref Range   Color, Urine STRAW (A) YELLOW   APPearance CLEAR CLEAR   Specific Gravity, Urine 1.003 (L) 1.005 - 1.030   pH 7.0 5.0 - 8.0   Glucose, UA NEGATIVE NEGATIVE mg/dL   Hgb urine dipstick NEGATIVE NEGATIVE   Bilirubin Urine NEGATIVE NEGATIVE   Ketones, ur NEGATIVE NEGATIVE mg/dL   Protein, ur NEGATIVE NEGATIVE mg/dL   Nitrite NEGATIVE NEGATIVE   Leukocytes,Ua NEGATIVE NEGATIVE  Protein / creatinine ratio, urine     Status: None   Collection Time: 04/12/22  9:58 PM  Result Value Ref Range   Creatinine, Urine 28 mg/dL   Total Protein, Urine <6 mg/dL   Protein Creatinine Ratio        0.00 - 0.15 mg/mg[Cre]  CBC     Status: Abnormal   Collection Time: 04/12/22 10:31 PM  Result Value Ref Range   WBC 9.6 4.0 - 10.5 K/uL   RBC 4.03 3.87 - 5.11 MIL/uL   Hemoglobin 11.9 (L) 12.0 - 15.0 g/dL   HCT 86.3 (L) 81.7 - 71.1 %   MCV 83.1 80.0 - 100.0 fL   MCH 29.5 26.0 - 34.0 pg   MCHC 35.5 30.0 - 36.0 g/dL   RDW 65.7 90.3 - 83.3 %   Platelets 259 150 - 400 K/uL   nRBC 0.0 0.0 - 0.2 %  Comprehensive metabolic panel     Status: Abnormal   Collection Time: 04/12/22 10:31 PM  Result Value Ref Range   Sodium 134 (L) 135 - 145 mmol/L   Potassium 3.9 3.5 - 5.1 mmol/L   Chloride 105 98 - 111 mmol/L   CO2 21 (L) 22 - 32 mmol/L   Glucose, Bld 106 (H) 70 - 99 mg/dL   BUN 7 6 - 20 mg/dL   Creatinine, Ser 3.83 0.44 - 1.00 mg/dL   Calcium 9.1 8.9 - 29.1 mg/dL   Total Protein 6.1 (L) 6.5 - 8.1 g/dL   Albumin 2.6 (L) 3.5 - 5.0 g/dL   AST 19 15 - 41 U/L   ALT 11 0 - 44 U/L   Alkaline Phosphatase 91 38 - 126 U/L   Total Bilirubin 0.4 0.3 - 1.2 mg/dL   GFR, Estimated >91 >66 mL/min   Anion gap 8 5 - 15  Type and screen     Status: None   Collection Time: 04/12/22 10:31 PM  Result Value Ref Range   ABO/RH(D) B POS    Antibody Screen NEG    Sample Expiration      04/15/2022,2359 Performed at [facility], [address], [place], [place] [address]     Imaging No results found.  MAU Course  Procedures Lab Orders         Urinalysis, Routine w reflex microscopic Urine, Clean Catch         CBC         Comprehensive metabolic panel         Protein / creatinine ratio, urine    No orders of the defined  types were placed in this encounter.  Imaging Orders  No imaging studies ordered today    MDM Patient presents for decreased fetal movement. Reports normal movement since arriving to MAU & has a reactive NST  Reviewed prenatal records scanned in media. Elevated BP at 28 week visit. Other vitals during prenatal care were normal.   Preeclampsia labs negative, no severe range BPs, & patient is asymptomatic.  Notified Dr. Carlis Abbott of patient's new onset hypertension. Will come see patient after calling the OR. Assessment and Plan   1. Gestational hypertension, third trimester  -New onset hypertension at term. No evidence of preeclampsia at this time. Dr. Carlis Abbott will come see patient to discuss delivery  2. Decreased fetal movements in third trimester, single or unspecified fetus  -Return of movement & reactive tracing  3. NST (non-stress test) reactive   4. [redacted] weeks gestation of pregnancy      Jorje Guild, NP 04/12/22 11:57 PM

## 2022-04-13 ENCOUNTER — Inpatient Hospital Stay (HOSPITAL_COMMUNITY)
Admission: RE | Admit: 2022-04-13 | Payer: BC Managed Care – PPO | Source: Home / Self Care | Admitting: Obstetrics and Gynecology

## 2022-04-13 ENCOUNTER — Encounter (HOSPITAL_COMMUNITY): Payer: Self-pay | Admitting: Obstetrics and Gynecology

## 2022-04-13 ENCOUNTER — Inpatient Hospital Stay (HOSPITAL_COMMUNITY): Payer: BC Managed Care – PPO | Admitting: Anesthesiology

## 2022-04-13 ENCOUNTER — Other Ambulatory Visit (HOSPITAL_COMMUNITY)
Admission: RE | Admit: 2022-04-13 | Discharge: 2022-04-13 | Disposition: A | Discharge status: Home / Self Care | Payer: BC Managed Care – PPO | Source: Ambulatory Visit

## 2022-04-13 ENCOUNTER — Encounter (HOSPITAL_COMMUNITY)
Admission: AD | Disposition: A | Discharge status: Home / Self Care | Payer: Self-pay | Source: Home / Self Care | Attending: Obstetrics and Gynecology

## 2022-04-13 DIAGNOSIS — F419 Anxiety disorder, unspecified: Secondary | ICD-10-CM | POA: Diagnosis present

## 2022-04-13 DIAGNOSIS — Z3A38 38 weeks gestation of pregnancy: Secondary | ICD-10-CM | POA: Diagnosis not present

## 2022-04-13 DIAGNOSIS — O2412 Pre-existing diabetes mellitus, type 2, in childbirth: Secondary | ICD-10-CM | POA: Diagnosis present

## 2022-04-13 DIAGNOSIS — O368130 Decreased fetal movements, third trimester, not applicable or unspecified: Secondary | ICD-10-CM | POA: Diagnosis present

## 2022-04-13 DIAGNOSIS — O134 Gestational [pregnancy-induced] hypertension without significant proteinuria, complicating childbirth: Secondary | ICD-10-CM | POA: Diagnosis present

## 2022-04-13 DIAGNOSIS — O133 Gestational [pregnancy-induced] hypertension without significant proteinuria, third trimester: Secondary | ICD-10-CM | POA: Diagnosis present

## 2022-04-13 DIAGNOSIS — E119 Type 2 diabetes mellitus without complications: Secondary | ICD-10-CM | POA: Diagnosis present

## 2022-04-13 DIAGNOSIS — Z7984 Long term (current) use of oral hypoglycemic drugs: Secondary | ICD-10-CM | POA: Diagnosis not present

## 2022-04-13 DIAGNOSIS — O99344 Other mental disorders complicating childbirth: Secondary | ICD-10-CM | POA: Diagnosis present

## 2022-04-13 DIAGNOSIS — O34211 Maternal care for low transverse scar from previous cesarean delivery: Secondary | ICD-10-CM | POA: Diagnosis present

## 2022-04-13 DIAGNOSIS — O24319 Unspecified pre-existing diabetes mellitus in pregnancy, unspecified trimester: Secondary | ICD-10-CM | POA: Diagnosis present

## 2022-04-13 DIAGNOSIS — Z794 Long term (current) use of insulin: Secondary | ICD-10-CM | POA: Diagnosis not present

## 2022-04-13 LAB — RPR: RPR Ser Ql: NONREACTIVE

## 2022-04-13 LAB — GLUCOSE, CAPILLARY
Glucose-Capillary: 80 mg/dL (ref 70–99)
Glucose-Capillary: 98 mg/dL (ref 70–99)

## 2022-04-13 SURGERY — Surgical Case
Anesthesia: Spinal

## 2022-04-13 MED ORDER — KETOROLAC TROMETHAMINE 30 MG/ML IJ SOLN
INTRAMUSCULAR | Status: AC
  Filled 2022-04-13: qty 1

## 2022-04-13 MED ORDER — CITALOPRAM HYDROBROMIDE 20 MG PO TABS
40.0000 mg | ORAL_TABLET | Freq: Every evening | ORAL | Status: DC
  Administered 2022-04-13 – 2022-04-14 (×2): 40 mg via ORAL
  Filled 2022-04-13 (×2): qty 2

## 2022-04-13 MED ORDER — POVIDONE-IODINE 10 % EX SWAB
2.0000 | Freq: Once | CUTANEOUS | Status: AC
  Administered 2022-04-13: 2 via TOPICAL

## 2022-04-13 MED ORDER — OXYCODONE HCL 5 MG PO TABS: 5.0000 mg | ORAL_TABLET | ORAL | Status: DC | PRN

## 2022-04-13 MED ORDER — OXYCODONE HCL 5 MG PO TABS: 5.0000 mg | ORAL_TABLET | Freq: Once | ORAL | Status: DC | PRN

## 2022-04-13 MED ORDER — IBUPROFEN 600 MG PO TABS
600.0000 mg | ORAL_TABLET | Freq: Four times a day (QID) | ORAL | Status: DC
  Administered 2022-04-14 – 2022-04-15 (×5): 600 mg via ORAL
  Filled 2022-04-13 (×5): qty 1

## 2022-04-13 MED ORDER — WITCH HAZEL-GLYCERIN EX PADS: 1.0000 | MEDICATED_PAD | CUTANEOUS | Status: DC | PRN

## 2022-04-13 MED ORDER — MENTHOL 3 MG MT LOZG: 1.0000 | LOZENGE | OROMUCOSAL | Status: DC | PRN

## 2022-04-13 MED ORDER — PHENYLEPHRINE HCL-NACL 20-0.9 MG/250ML-% IV SOLN
INTRAVENOUS | Status: DC | PRN
  Administered 2022-04-13: 60 ug/min via INTRAVENOUS

## 2022-04-13 MED ORDER — DIPHENHYDRAMINE HCL 50 MG/ML IJ SOLN: 12.5000 mg | INTRAMUSCULAR | Status: DC | PRN

## 2022-04-13 MED ORDER — CEFAZOLIN SODIUM-DEXTROSE 2-4 GM/100ML-% IV SOLN
INTRAVENOUS | Status: AC
  Filled 2022-04-13: qty 100

## 2022-04-13 MED ORDER — DIPHENHYDRAMINE HCL 25 MG PO CAPS: 25.0000 mg | ORAL_CAPSULE | ORAL | Status: DC | PRN

## 2022-04-13 MED ORDER — FAMOTIDINE IN NACL 20-0.9 MG/50ML-% IV SOLN
20.0000 mg | Freq: Once | INTRAVENOUS | Status: DC
  Filled 2022-04-13: qty 50

## 2022-04-13 MED ORDER — ONDANSETRON HCL 4 MG/2ML IJ SOLN: 4.0000 mg | Freq: Once | INTRAMUSCULAR | Status: DC | PRN

## 2022-04-13 MED ORDER — FENTANYL CITRATE (PF) 100 MCG/2ML IJ SOLN
INTRAMUSCULAR | Status: AC
  Filled 2022-04-13: qty 2

## 2022-04-13 MED ORDER — SOD CITRATE-CITRIC ACID 500-334 MG/5ML PO SOLN
30.0000 mL | Freq: Once | ORAL | Status: AC
  Administered 2022-04-13: 30 mL via ORAL
  Filled 2022-04-13: qty 30

## 2022-04-13 MED ORDER — KETOROLAC TROMETHAMINE 30 MG/ML IJ SOLN
30.0000 mg | Freq: Four times a day (QID) | INTRAMUSCULAR | Status: AC
  Administered 2022-04-13 – 2022-04-14 (×3): 30 mg via INTRAVENOUS
  Filled 2022-04-13 (×4): qty 1

## 2022-04-13 MED ORDER — HYDROMORPHONE HCL 1 MG/ML IJ SOLN: 0.2500 mg | INTRAMUSCULAR | Status: DC | PRN

## 2022-04-13 MED ORDER — SCOPOLAMINE 1 MG/3DAYS TD PT72
MEDICATED_PATCH | TRANSDERMAL | Status: AC
  Filled 2022-04-13: qty 1

## 2022-04-13 MED ORDER — ACETAMINOPHEN 500 MG PO TABS
1000.0000 mg | ORAL_TABLET | Freq: Four times a day (QID) | ORAL | Status: DC
  Administered 2022-04-13 – 2022-04-15 (×8): 1000 mg via ORAL
  Filled 2022-04-13 (×8): qty 2

## 2022-04-13 MED ORDER — COCONUT OIL OIL: 1.0000 | TOPICAL_OIL | Status: DC | PRN

## 2022-04-13 MED ORDER — NALOXONE HCL 0.4 MG/ML IJ SOLN: 0.4000 mg | INTRAMUSCULAR | Status: DC | PRN

## 2022-04-13 MED ORDER — SENNOSIDES-DOCUSATE SODIUM 8.6-50 MG PO TABS
2.0000 | ORAL_TABLET | ORAL | Status: DC
  Administered 2022-04-13 – 2022-04-15 (×3): 2 via ORAL
  Filled 2022-04-13 (×3): qty 2

## 2022-04-13 MED ORDER — SCOPOLAMINE 1 MG/3DAYS TD PT72
1.0000 | MEDICATED_PATCH | Freq: Once | TRANSDERMAL | Status: DC
  Administered 2022-04-13: 1.5 mg via TRANSDERMAL

## 2022-04-13 MED ORDER — DIPHENHYDRAMINE HCL 25 MG PO CAPS: 25.0000 mg | ORAL_CAPSULE | Freq: Four times a day (QID) | ORAL | Status: DC | PRN

## 2022-04-13 MED ORDER — OXYTOCIN-SODIUM CHLORIDE 30-0.9 UT/500ML-% IV SOLN: 2.5000 [IU]/h | INTRAVENOUS | Status: DC

## 2022-04-13 MED ORDER — LACTATED RINGERS IV SOLN: INTRAVENOUS | Status: DC

## 2022-04-13 MED ORDER — LACTATED RINGERS IV SOLN: INTRAVENOUS | Status: DC | PRN

## 2022-04-13 MED ORDER — MORPHINE SULFATE (PF) 0.5 MG/ML IJ SOLN
INTRAMUSCULAR | Status: DC | PRN
  Administered 2022-04-13: 150 ug via INTRATHECAL

## 2022-04-13 MED ORDER — SIMETHICONE 80 MG PO CHEW
80.0000 mg | CHEWABLE_TABLET | Freq: Three times a day (TID) | ORAL | Status: DC
  Administered 2022-04-13 – 2022-04-15 (×6): 80 mg via ORAL
  Filled 2022-04-13 (×6): qty 1

## 2022-04-13 MED ORDER — INSULIN GLARGINE-YFGN 100 UNIT/ML ~~LOC~~ SOLN
32.0000 [IU] | Freq: Every day | SUBCUTANEOUS | Status: DC
  Administered 2022-04-13: 32 [IU] via SUBCUTANEOUS
  Filled 2022-04-13 (×2): qty 0.32

## 2022-04-13 MED ORDER — FENTANYL CITRATE (PF) 100 MCG/2ML IJ SOLN
INTRAMUSCULAR | Status: DC | PRN
  Administered 2022-04-13: 15 ug via INTRATHECAL

## 2022-04-13 MED ORDER — BUPIVACAINE IN DEXTROSE 0.75-8.25 % IT SOLN
INTRATHECAL | Status: DC | PRN
  Administered 2022-04-13: 12 mg via INTRATHECAL

## 2022-04-13 MED ORDER — METFORMIN HCL ER 750 MG PO TB24
750.0000 mg | ORAL_TABLET | Freq: Two times a day (BID) | ORAL | Status: DC
  Administered 2022-04-14 – 2022-04-15 (×3): 750 mg via ORAL
  Filled 2022-04-13 (×5): qty 1

## 2022-04-13 MED ORDER — SODIUM CHLORIDE 0.9% FLUSH: 3.0000 mL | INTRAVENOUS | Status: DC | PRN

## 2022-04-13 MED ORDER — PHENYLEPHRINE 80 MCG/ML (10ML) SYRINGE FOR IV PUSH (FOR BLOOD PRESSURE SUPPORT)
PREFILLED_SYRINGE | INTRAVENOUS | Status: DC | PRN
  Administered 2022-04-13: 160 ug via INTRAVENOUS

## 2022-04-13 MED ORDER — ONDANSETRON HCL 4 MG/2ML IJ SOLN: 4.0000 mg | Freq: Three times a day (TID) | INTRAMUSCULAR | Status: DC | PRN

## 2022-04-13 MED ORDER — DEXAMETHASONE SODIUM PHOSPHATE 10 MG/ML IJ SOLN
INTRAMUSCULAR | Status: DC | PRN
  Administered 2022-04-13: 10 mg via INTRAVENOUS

## 2022-04-13 MED ORDER — INSULIN GLARGINE-YFGN 100 UNIT/ML ~~LOC~~ SOLN
32.0000 [IU] | Freq: Every day | SUBCUTANEOUS | Status: DC
  Administered 2022-04-14 (×2): 32 [IU] via SUBCUTANEOUS
  Filled 2022-04-13 (×3): qty 0.32

## 2022-04-13 MED ORDER — CEFAZOLIN SODIUM-DEXTROSE 2-4 GM/100ML-% IV SOLN
2.0000 g | Freq: Once | INTRAVENOUS | Status: AC
  Administered 2022-04-13: 2 g via INTRAVENOUS
  Filled 2022-04-13: qty 100

## 2022-04-13 MED ORDER — KETOROLAC TROMETHAMINE 30 MG/ML IJ SOLN
30.0000 mg | Freq: Once | INTRAMUSCULAR | Status: AC | PRN
  Administered 2022-04-13: 30 mg via INTRAVENOUS

## 2022-04-13 MED ORDER — MORPHINE SULFATE (PF) 0.5 MG/ML IJ SOLN
INTRAMUSCULAR | Status: AC
  Filled 2022-04-13: qty 10

## 2022-04-13 MED ORDER — INSULIN ASPART 100 UNIT/ML IJ SOLN
0.0000 [IU] | Freq: Three times a day (TID) | INTRAMUSCULAR | Status: DC
  Administered 2022-04-14: 2 [IU] via SUBCUTANEOUS
  Administered 2022-04-14: 3 [IU] via SUBCUTANEOUS

## 2022-04-13 MED ORDER — PRENATAL MULTIVITAMIN CH
1.0000 | ORAL_TABLET | Freq: Every day | ORAL | Status: DC
  Administered 2022-04-13 – 2022-04-15 (×3): 1 via ORAL
  Filled 2022-04-13 (×3): qty 1

## 2022-04-13 MED ORDER — OXYCODONE HCL 5 MG/5ML PO SOLN: 5.0000 mg | Freq: Once | ORAL | Status: DC | PRN

## 2022-04-13 MED ORDER — ONDANSETRON HCL 4 MG/2ML IJ SOLN
INTRAMUSCULAR | Status: DC | PRN
  Administered 2022-04-13: 4 mg via INTRAVENOUS

## 2022-04-13 MED ORDER — DIBUCAINE (PERIANAL) 1 % EX OINT: 1.0000 | TOPICAL_OINTMENT | CUTANEOUS | Status: DC | PRN

## 2022-04-13 MED ORDER — SIMETHICONE 80 MG PO CHEW: 80.0000 mg | CHEWABLE_TABLET | ORAL | Status: DC | PRN

## 2022-04-13 MED ORDER — NALOXONE HCL 4 MG/10ML IJ SOLN: 1.0000 ug/kg/h | INTRAVENOUS | Status: DC | PRN

## 2022-04-13 MED ORDER — OXYTOCIN-SODIUM CHLORIDE 30-0.9 UT/500ML-% IV SOLN
INTRAVENOUS | Status: DC | PRN
  Administered 2022-04-13 (×2): 30 [IU] via INTRAVENOUS

## 2022-04-13 SURGICAL SUPPLY — 34 items
BENZOIN TINCTURE PRP APPL 2/3 (GAUZE/BANDAGES/DRESSINGS) IMPLANT
CHLORAPREP W/TINT 26ML (MISCELLANEOUS) ×2 IMPLANT
CLAMP UMBILICAL CORD (MISCELLANEOUS) ×1 IMPLANT
CLOTH BEACON ORANGE TIMEOUT ST (SAFETY) ×1 IMPLANT
DERMABOND ADVANCED .7 DNX12 (GAUZE/BANDAGES/DRESSINGS) IMPLANT
DRSG OPSITE POSTOP 4X10 (GAUZE/BANDAGES/DRESSINGS) ×1 IMPLANT
ELECT REM PT RETURN 9FT ADLT (ELECTROSURGICAL) ×1
ELECTRODE REM PT RTRN 9FT ADLT (ELECTROSURGICAL) ×1 IMPLANT
EXTRACTOR VACUUM KIWI (MISCELLANEOUS) IMPLANT
GAUZE SPONGE 4X4 12PLY STRL LF (GAUZE/BANDAGES/DRESSINGS) IMPLANT
GLOVE BIO SURGEON STRL SZ7 (GLOVE) ×1 IMPLANT
GLOVE BIOGEL PI IND STRL 7.0 (GLOVE) ×1 IMPLANT
GOWN STRL REUS W/TWL LRG LVL3 (GOWN DISPOSABLE) ×2 IMPLANT
KIT ABG SYR 3ML LUER SLIP (SYRINGE) IMPLANT
NDL HYPO 25X5/8 SAFETYGLIDE (NEEDLE) IMPLANT
NEEDLE HYPO 22GX1.5 SAFETY (NEEDLE) IMPLANT
NEEDLE HYPO 25X5/8 SAFETYGLIDE (NEEDLE) IMPLANT
NS IRRIG 1000ML POUR BTL (IV SOLUTION) ×1 IMPLANT
PACK C SECTION WH (CUSTOM PROCEDURE TRAY) ×1 IMPLANT
PAD ABD 7.5X8 STRL (GAUZE/BANDAGES/DRESSINGS) IMPLANT
PAD OB MATERNITY 4.3X12.25 (PERSONAL CARE ITEMS) ×1 IMPLANT
RTRCTR C-SECT PINK 25CM LRG (MISCELLANEOUS) ×1 IMPLANT
STRIP CLOSURE SKIN 1/2X4 (GAUZE/BANDAGES/DRESSINGS) IMPLANT
SUT CHROMIC 1 CTX 36 (SUTURE) ×2 IMPLANT
SUT CHROMIC 2 0 CT 1 (SUTURE) ×1 IMPLANT
SUT PDS AB 0 CTX 60 (SUTURE) ×1 IMPLANT
SUT VIC AB 0 CT1 36 (SUTURE) IMPLANT
SUT VIC AB 2-0 CT1 27 (SUTURE) ×1
SUT VIC AB 2-0 CT1 TAPERPNT 27 (SUTURE) ×1 IMPLANT
SUT VIC AB 4-0 KS 27 (SUTURE) IMPLANT
SYR 30ML LL (SYRINGE) IMPLANT
TOWEL OR 17X24 6PK STRL BLUE (TOWEL DISPOSABLE) ×1 IMPLANT
TRAY FOLEY W/BAG SLVR 14FR LF (SET/KITS/TRAYS/PACK) ×1 IMPLANT
WATER STERILE IRR 1000ML POUR (IV SOLUTION) ×1 IMPLANT

## 2022-04-13 NOTE — Anesthesia Postprocedure Evaluation (Signed)
Anesthesia Post Note  Patient: Kimberly Diaz  Procedure(s) Performed: [place]     Patient location during evaluation: PACU Anesthesia Type: Spinal Level of consciousness: awake and alert Pain management: pain level controlled Vital Signs Assessment: post-procedure vital signs reviewed and stable Respiratory status: spontaneous breathing, nonlabored ventilation and respiratory function stable Cardiovascular status: blood pressure returned to baseline and stable Postop Assessment: no apparent nausea or vomiting Anesthetic complications: no   No notable events documented.  Last Vitals:  Vitals:   04/13/22 0818 04/13/22 0832  BP: 137/78   Pulse: 86   Resp:    Temp:  36.8 C  SpO2: 99%     Last Pain:  Vitals:   04/13/22 0832  TempSrc: Oral  PainSc:    Pain Goal:                Epidural/Spinal Function Cutaneous sensation: Tingles (04/13/22 0825), Patient able to flex knees: Yes (04/13/22 0825), Patient able to lift hips off bed: Yes (04/13/22 0825), Back pain beyond tenderness at insertion site: No (04/13/22 0825), Progressively worsening motor and/or sensory loss: No (04/13/22 0825), Bowel and/or bladder incontinence post epidural: No (04/13/22 0825)  Lynda Rainwater

## 2022-04-13 NOTE — Lactation Note (Signed)
This note was copied from a baby's chart. Lactation Consultation Note  Patient Name: Kimberly Diaz [id number] Date: 04/13/2022 Reason for consult: Initial assessment;Early term 37-38.6wks;Maternal endocrine disorder (LGA female infant greater than 9 lbs at birth, C/S delivery.) Age:32 hours Infant had one void and one stool since birth. Birth Parent is experienced at breast feeding she BF 1st child who is 35 years old for 16 months. Birth Parent feels breastfeeding is going well infant is latching without difficulties and recent feeding was 30 minutes in length at 2115 pm. LC did not observe latch and infant is asleep in basinet. Birth Parent will continue to BF infant according to cues, on demand, 8 to 12+ times within 24 hrs, STS. Birth Parent knows to call RN/LC if their are any BF questions, concerns or if Birth Parent needs latch assistance. Mom made aware of O/P services, breastfeeding support groups, community resources, and our phone # for post-discharge questions.   Maternal Data Has patient been taught Hand Expression?: Yes  Feeding Mother's Current Feeding Choice: Breast Milk  LATCH Score                    Lactation Tools Discussed/Used    Interventions Interventions: Breast feeding basics reviewed;Skin to skin;Position options;Education;Hand express;LC Services brochure  Discharge Pump:  (Per Birth Parent she has Medela Freestyle Flex DEBP and Spectra DEBP at home.)  Consult Status Consult Status: Follow-up Date: 04/14/22 Follow-up type: In-patient    Vicente Serene 04/13/2022, 10:08 PM

## 2022-04-13 NOTE — Progress Notes (Signed)
Called Dr Marvel Plan to review orders. Wants cbgs fasting and one hour after meals. Patients husband has currently left to pick up dinner, will check cbg one hour after she eats.

## 2022-04-13 NOTE — Anesthesia Procedure Notes (Signed)
Spinal  Patient location during procedure: OB Start time: 04/13/2022 5:26 AM End time: 04/13/2022 5:30 AM Reason for block: surgical anesthesia Staffing Performed: anesthesiologist  Anesthesiologist: Barnet Glasgow, MD Performed by: Barnet Glasgow, MD Authorized by: Barnet Glasgow, MD   Preanesthetic Checklist Completed: patient identified, IV checked, risks and benefits discussed, surgical consent, monitors and equipment checked, pre-op evaluation and timeout performed Spinal Block Patient position: sitting Prep: DuraPrep and site prepped and draped Patient monitoring: heart rate, cardiac monitor, continuous pulse ox and blood pressure Approach: midline Location: L3-4 Injection technique: single-shot Needle Needle type: Pencan  Needle gauge: 24 G Needle length: 10 cm Needle insertion depth: 6 cm Assessment Sensory level: T4 Events: CSF return Additional Notes  1 Attempt (s). Pt tolerated procedure well.

## 2022-04-13 NOTE — Anesthesia Preprocedure Evaluation (Signed)
Anesthesia Evaluation  Patient identified by MRN, date of birth, ID band Patient awake    Reviewed: Allergy & Precautions, NPO status , Patient's Chart, lab work & pertinent test results  Airway Mallampati: II  TM Distance: >3 FB Neck ROM: Full    Dental no notable dental hx. (+) Teeth Intact, Dental Advisory Given   Pulmonary neg pulmonary ROS,    Pulmonary exam normal breath sounds clear to auscultation       Cardiovascular Normal cardiovascular exam Rhythm:Regular Rate:Normal     Neuro/Psych negative neurological ROS  negative psych ROS   GI/Hepatic   Endo/Other  diabetes, Type 2  Renal/GU      Musculoskeletal   Abdominal   Peds  Hematology   Anesthesia Other Findings   Reproductive/Obstetrics (+) Pregnancy                             Anesthesia Physical Anesthesia Plan  ASA: 3  Anesthesia Plan: Spinal   Post-op Pain Management: Toradol IV (intra-op)* and Regional block*   Induction:   PONV Risk Score and Plan: Treatment may vary due to age or medical condition and Ondansetron  Airway Management Planned: Natural Airway and Nasal Cannula  Additional Equipment: None  Intra-op Plan:   Post-operative Plan:   Informed Consent: I have reviewed the patients History and Physical, chart, labs and discussed the procedure including the risks, benefits and alternatives for the proposed anesthesia with the patient or authorized representative who has indicated his/her understanding and acceptance.       Plan Discussed with:   Anesthesia Plan Comments: (38.5 wk G2P1 w DM2 and BMI 38.5 for Rpt  c/s under spinal)        Anesthesia Quick Evaluation

## 2022-04-13 NOTE — H&P (Signed)
31 y.o. G2P1001 @ [redacted]w[redacted]d presents for to MAU with complaints of decreased fetal movement.  Upon arriving to MAU, fetal monitoring was reassuring and movement increased.  She was noted to have new onset elevated blood pressures.  She had elevated BP of 140/80s at 28 weeks, but otherwise had normal blood pressure.  In MAU, BP 126-157/82-96.  She otherwise feels well, denies HA, RUQ or epigastric pain or visual changes.    Pregnancy complicated by:  Type 2 DM:  Has been managed by endocrinology this pregnancy.  Currently on metformin 750mg  BID, Humalog 28 / 38 / 34U with meals and glargine 64U qhs.  Normal fetal ECHO. Most recent growth on 9/28 with EFW 8lb 12oz (>90%) History of cesarean section for arrest of dilation: planned RCS scheduled 10/11 Pre-pregnancy BMI 35 Anxiety: on celexa 40mg  daily  Past Medical History:  Diagnosis Date   Anxiety    Chronic back pain    muscle stiffness   Diabetes mellitus without complication (HCC)    metformin   Fatty liver    Hx of varicella    PCOS (polycystic ovarian syndrome)    possible     Past Surgical History:  Procedure Laterality Date   CESAREAN SECTION N/A 11/06/2016   Procedure: CESAREAN SECTION;  Surgeon: , MD;  Location: [facility];  Service: Obstetrics;  Laterality: N/A;   EYE SURGERY     WISDOM TOOTH EXTRACTION  07/06/2010    OB History  Gravida Para Term Preterm AB Living  2 1 1     1   SAB IAB Ectopic Multiple Live Births               # Outcome Date GA Lbr Len/2nd Weight Sex Delivery Anes PTL Lv  2 Current           1 Term      CS-LTranv       Social History   Socioeconomic History   Marital status: Married    Spouse name: Not on file   Number of children: Not on file   Years of education: Not on file   Highest education level: Not on file  Occupational History   Not on file  Tobacco Use   Smoking status: Never   Smokeless tobacco: Never  Vaping Use   Vaping Use: Never used  Substance and  Sexual Activity   Alcohol use: No   Drug use: No   Sexual activity: Yes    Birth control/protection: Other-see comments    Comment: will discuss with MD   Other Topics Concern   Not on file  Social History Narrative   Not on file   Social Determinants of Health   Financial Resource Strain: Not on file  Food Insecurity: Not on file  Transportation Needs: Not on file  Physical Activity: Not on file  Stress: Not on file  Social Connections: Not on file  Intimate Partner Violence: Not on file   Patient has no known allergies.    Prenatal Transfer Tool  Maternal Diabetes: Yes:  Diabetes Type:  Pre-pregnancy Genetic Screening: Declined Maternal Ultrasounds/Referrals: Normal Fetal Ultrasounds or other Referrals:  Referred to Materal Fetal Medicine  for DM2 Maternal Substance Abuse:  No Significant Maternal Medications:  None Significant Maternal Lab Results: Group B Strep negative  ABO, Rh: --/--/B POS (10/08 2231) Antibody: NEG (10/08 2231) Rubella: Immune (04/05 0000) RPR: Nonreactive (04/05 0000)  HBsAg: Negative (04/05 0000)  HIV: Non-reactive (04/05 0000)  GBS: Negative/-- (09/21  0000)      Vitals:   04/12/22 2350 04/12/22 2355  BP:    Pulse:    Resp:    Temp:    SpO2: 96% 96%     General:  NAD Abdomen:  soft, gravid Ex:  trace edema FHTs:  120s, moderate variability, category 1    A/P   31 y.o. G2P1001 [redacted]w[redacted]d presents with decreased fetal movement and found to have gestational hypertension at term GHTN:  labile BPs in MAU.  Given gestational age > [redacted] weeks along with DM2, I recommend proceeding with delivery at this time.  2. History of cesarean section, desires repeat.  Last ate at 1830, will plan to proceed at 0500.  NPO until that time.  We discussed the risks to cesarean section to include infection, bleeding, damage to surrounding structures (including but not limited to bowel, bladder, tubes, ovaries, nerves, vessels, baby), need for blood transfusion,  venous thromboembolism, need for additional procedures.  Consent signed.  Ancef on call to OR 3. DM 2 on insulin.  Plan of care following delivery per her endocrinologist is to restart her glargine at 1/2 dose, SSI only for meals and continue metformin 4. Anxiety: continue celexa   [place]

## 2022-04-13 NOTE — Op Note (Signed)
Cesarean Section Procedure Note  Pre-operative Diagnosis: 1. Intrauterine pregnancy at [redacted]w[redacted]d  2. History of cesarean section, desires repeat 3. Gestational hypertension  Post-operative Diagnosis: same as above  Surgeon: Marlow Baars, MD  Assistants: Sheppard Evens, MD  Procedure: Repeat low transverse cesarean section   Anesthesia: Spinal anesthesia  Estimated Blood Loss: 550 mL         Drains: Foley catheter         Specimens: placenta to pathology              Complications:  None; patient tolerated the procedure well.         Disposition: PACU - hemodynamically stable.  Findings:  Normal uterus, tubes and ovaries bilaterally.  Viable female infant, 4610g (10lb 2.6oz) Apgars 7, 8, 9.    Procedure Details   The patient was taken to the OR for repeat cesarean section for new onset gestational hypertension at term. After spinal  anesthesia was administered,  the patient was placed in the dorsal supine position with a leftward tilt.  She was noted to quickly became nauseous and significant hypotension was noted.  The single IV in place was infiltrated and a new IV was quickly placed to allow for administration of phenylephrine to return blood pressure to baseline.  With the hypotension, fetal response was noted with a bradycardia sustained to the 90s.  The vaginal prep had already been performed and the foley catheter was in place.  The abdomen was then quickly prepped with betadine to allow for an urgent start to the cesarean section due to fetal bradycardia.   A Pfannenstiel incision was made and carried down through the subcutaneous tissue to the fascia.  The fascia was incised in the midline and the fascial incision was extended laterally with Mayo scissors. The superior aspect of the fascial incision was grasped with two Kocher clamps, tented up and the rectus muscles dissected off sharply. The rectus was then dissected off with blunt dissection and Mayo scissors inferiorly. The  rectus muscles were separated in the midline. The abdominal peritoneum was identified, tented up, entered bluntly, and the incision was extended superiorly and inferiorly with good visualization of the bladder. The Alexis retractor was deployed. The bladder was noted well below the level of the lower uterine segment. A scalpel was then used to make a low transverse incision on the uterus which was extended in the cephalad-caudad direction with blunt dissection. The fluid was clear. The fetal vertex was identified, elevated out of the pelvis and brought to the hysterotomy.  The head was delivered easily followed by the shoulders and body.  Tone was poor and baby did not respond to initial stimulation so cord was clamped and cut at 30 seconds and the infant was passed to the waiting neonatologist.  The placenta was then delivered spontaneously, intact and appear normal, the uterus was cleared of all clot and debris   The hysterotomy was repaired with #0 Monocryl in running locked fashion.  A figure of eight suture was placed at the left of the hysterotomy, and excellent hemostasis was noted.  The Alexis retractor was removed from the abdomen. The peritoneum was examined and all vessels noted to be hemostatic. The abdominal cavity was cleared of all clot and debris.  The peritoneum was closed with 2-0 vicryl in a running fashion.  The rectus muscles were then closed with 2-0 Vicryl. The fascia and rectus muscles were inspected and were hemostatic. The fascia was closed with 0 Vicryl in a  running fashion. The subcutaneous layer was irrigated and all bleeders cauterized. The subcutaneous layer was closed with interrupted plain gut. The skin was closed with 4-0 monocryl in a subcuticular fashion. The incision was dressed with benzoine, steri strips and honeycomb dressing. All sponge lap and needle counts were correct x3. Patient tolerated the procedure well and recovered in stable condition following the  procedure.  An experienced assistant was required given the standard of surgical care given the complexity of the case.  This assistant was needed for exposure, dissection, suctioning, retraction, instrument exchange, urgent nature of procedure, assisting with delivery with administration of fundal pressure, and for overall help during the procedure.

## 2022-04-13 NOTE — Transfer of Care (Signed)
Immediate Anesthesia Transfer of Care Note  Patient: Kimberly Diaz  Procedure(s) Performed: CESAREAN SECTION  Patient Location: PACU  Anesthesia Type:Spinal  Level of Consciousness: awake, alert  and patient cooperative  Airway & Oxygen Therapy: Patient Spontanous Breathing  Post-op Assessment: Report given to RN and Post -op Vital signs reviewed and stable  Post vital signs: Reviewed and stable  Last Vitals:  Vitals Value Taken Time  BP 123/69 04/13/22 0645  Temp    Pulse 81 04/13/22 0646  Resp 21 04/13/22 0646  SpO2 99 % 04/13/22 0646  Vitals shown include unvalidated device data.  Last Pain:  Vitals:   04/12/22 2148  TempSrc:   PainSc: 4          Complications: No notable events documented.

## 2022-04-13 NOTE — Progress Notes (Signed)
LDA's removed in error, patient has iv and foley catheter intact per order.

## 2022-04-14 LAB — CBC
HCT: 32.3 % — ABNORMAL LOW (ref 36.0–46.0)
Hemoglobin: 10.8 g/dL — ABNORMAL LOW (ref 12.0–15.0)
MCH: 28.9 pg (ref 26.0–34.0)
MCHC: 33.4 g/dL (ref 30.0–36.0)
MCV: 86.4 fL (ref 80.0–100.0)
Platelets: 245 10*3/uL (ref 150–400)
RBC: 3.74 MIL/uL — ABNORMAL LOW (ref 3.87–5.11)
RDW: 14.5 % (ref 11.5–15.5)
WBC: 10.9 10*3/uL — ABNORMAL HIGH (ref 4.0–10.5)
nRBC: 0 % (ref 0.0–0.2)

## 2022-04-14 LAB — GLUCOSE, CAPILLARY
Glucose-Capillary: 103 mg/dL — ABNORMAL HIGH (ref 70–99)
Glucose-Capillary: 130 mg/dL — ABNORMAL HIGH (ref 70–99)
Glucose-Capillary: 139 mg/dL — ABNORMAL HIGH (ref 70–99)
Glucose-Capillary: 166 mg/dL — ABNORMAL HIGH (ref 70–99)
Glucose-Capillary: 77 mg/dL (ref 70–99)

## 2022-04-14 NOTE — Progress Notes (Signed)
Subjective: Postpartum Day 1: Cesarean Delivery Patient reports tolerating PO, + flatus, and no problems voiding. She denies CP, SOB, HA or dizziness. She is breastfeeding well and bonding with baby. She has no complaints. She asked about early discharge home. Per pt peds recommends seeing pediatric urologist for circumcision   Objective: Vital signs in last 24 hours: Temp:  [97.8 F (36.6 C)-98.6 F (37 C)] 97.8 F (36.6 C) (10/10 0620) Pulse Rate:  [68-75] 68 (10/10 0620) Resp:  [18] 18 (10/10 0620) BP: (111-130)/(69-75) 111/69 (10/10 0620) SpO2:  [96 %-98 %] 98 % (10/10 0620)  Physical Exam:  General: alert, cooperative, and no distress Lochia: appropriate Uterine Fundus: firm Incision: no significant drainage DVT Evaluation: No evidence of DVT seen on physical exam. Calf/Ankle edema is present.  Recent Labs    04/12/22 2231 04/14/22 0457  HGB 11.9* 10.8*  HCT 33.5* 32.3*    Assessment/Plan: Status post Cesarean section. Doing well postoperatively.  Continue current care Discharge home tomorrow.  Isaiah Serge, DO 04/14/2022, 11:52 AM

## 2022-04-15 LAB — GLUCOSE, CAPILLARY: Glucose-Capillary: 107 mg/dL — ABNORMAL HIGH (ref 70–99)

## 2022-04-15 MED ORDER — IBUPROFEN 600 MG PO TABS: 600.0000 mg | ORAL_TABLET | Freq: Four times a day (QID) | ORAL | 0 refills | Status: DC | PRN

## 2022-04-15 MED ORDER — OXYCODONE HCL 5 MG PO TABS: 5.0000 mg | ORAL_TABLET | ORAL | 0 refills | Status: DC | PRN

## 2022-04-15 NOTE — Lactation Note (Signed)
This note was copied from a baby's chart. Lactation Consultation Note  Patient Name: Kimberly Diaz Date: 10/[birth year]23Age 64 hours old  Reason for consult: Follow-up assessment;Early term 37-38.6wks;Nipple pain/trauma;Infant weight loss;Maternal endocrine disorder Baby awake , hungry, and offered to assist with latching. Mom receptive.  LC noted a recessed chin and when baby was crying short stretching skin tag under the mid section of the tongue. Baby able to raise tongue upward a short distance.  Difficult to assess whether baby can extend the tongue over the lips due to crying.  LC's assessment of nipple / areola complex noted edema , and sore nipples bilaterally. Per mom the Lt Nipple more sore than the Rt Nipple. LT nipple noted to be cracked at the base and sides. See the doc flow sheets for details of the latches on both breast.  LC recommended prior to latching on either breast until soreness improves  EBM to nipples liberally . Coconut oil ( a dab ) on the sides of the nipple and areola when pumping.  Shells between feedings while awake. LC stressed the importance of taking a break from the shells if needed.  Prior to every latch until the soreness has improved:  Breast massage, hand express, pre- pump to prime the milk ducts and reverse pressure.  LC showed mom how compressible the areola has to be prior to latching to obtain a deep latch consistently to prevent further soreness.  Due to the 6 % weight loss and the sore nipples , LC recommended after 3-4 feedings a day when the baby isn't cluster feeding post pump with the DEBP 10 mins , save milk to spoon feed back to the baby.  LC reviewed BF D/C teaching and per mom be calling the [place] from the [place] office for [place] F/U.      Maternal Data Has patient been taught Hand Expression?: Yes Does the patient have breastfeeding experience prior to this delivery?: Yes  Feeding Mother's Current Feeding Choice: Breast Milk  LATCH  Score Latch: Grasps breast easily, tongue down, lips flanged, rhythmical sucking.  Audible Swallowing: Spontaneous and intermittent  Type of Nipple: Everted at rest and after stimulation  Comfort (Breast/Nipple): Filling, red/small blisters or bruises, mild/mod discomfort  Hold (Positioning): Assistance needed to correctly position infant at breast and maintain latch.  LATCH Score: 8   Lactation Tools Discussed/Used Tools: Shells;Pump;Flanges;Comfort gels Flange Size: 21;24 Breast pump type: Manual Pump Education: Setup, frequency, and cleaning;Milk Storage Reason for Pumping: LC recommended prior to latch - breast massage, hand express, prepump and reverse pressure to elonagte the nipple / areola complex  Interventions Interventions: Breast feeding basics reviewed;Assisted with latch;Skin to skin;Breast massage;Hand express;Pre-pump if needed;Reverse pressure;Breast compression;Adjust position;Support pillows;Position options;Shells;Comfort gels;Hand pump;Education;LC Services brochure  Discharge Discharge Education: Engorgement and breast care;Warning signs for feeding baby;Other (comment) (private LC at [facility] office) Pump: DEBP;Manual;Hands Free;Personal WIC Program: No  Consult Status Consult Status: Complete Date: 04/15/22 Follow-up type: In-patient    [place] 04/15/2022, 12:25 PM

## 2022-04-15 NOTE — Discharge Summary (Signed)
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 She delivered by repeat C/S. Her postpartum course was uncomplicated  Magnesium Sulfate received: No BMZ received: No Rhophylac:N/A  Physical exam  Vitals:   04/14/22 1630 04/14/22 2024 04/15/22 0534 04/15/22 0640  BP: 132/82 132/84 (!) 147/84 139/75  Pulse: 71 80 66 75  Resp: _0 Temp: 97.7 F (36.5 C) 98.4 F (36.9 C) 98.2 F (36.8 C)   TempSrc: Oral Oral Oral   SpO2: 99% 99% 98%   Weight:      Height:       General: alert, cooperative, and no distress Lochia: appropriate Uterine Fundus: firm Incision: Healing well with no significant drainage DVT Evaluation: No evidence of DVT seen on physical exam. Labs: Lab Results  Component Value Date   WBC 10.9 (H) 04/14/2022   HGB 10.8 (L) 04/14/2022   HCT 32.3 (L) 04/14/2022   MCV 86.4 04/14/2022   PLT 245 04/14/2022      Latest Ref Rng & Units 04/12/2022   10:31 PM  CMP  Glucose 70 - 99 mg/dL 106   BUN 6 - 20 mg/dL 7   Creatinine 0.44 - 1.00 mg/dL 0.45   Sodium 135 - 145 mmol/L 134    Potassium 3.5 - 5.1 mmol/L 3.9   Chloride 98 - 111 mmol/L 105   CO2 22 - 32 mmol/L 21   Calcium 8.9 - 10.3 mg/dL 9.1   Total Protein 6.5 - 8.1 g/dL 6.1   Total Bilirubin 0.3 - 1.2 mg/dL 0.4   Alkaline Phos 38 - 126 U/L 91   AST 15 - 41 U/L 19   ALT 0 - 44 U/L 11    Edinburgh Score:    04/13/2022    8:44 AM  Edinburgh Postnatal Depression Scale Screening Tool  I have been able to laugh and see the funny side of things. 0  I have looked forward with enjoyment to things. 0  I have blamed myself unnecessarily when things went wrong. 2  I have been anxious or worried for no good reason. 1  I have felt scared or panicky for no good reason. 1  Things have been getting on top of me. 1  I have been so unhappy that I have had difficulty sleeping. 0  I have felt sad or miserable. 0  I have been so unhappy that I have been crying. 0  The thought of harming myself has occurred to me. 0  Edinburgh Postnatal Depression Scale Total 5  After visit meds:  Allergies as of 04/15/2022   No Known Allergies      Medication List     STOP taking these medications    aspirin EC 81 MG tablet   calcium carbonate 750 MG chewable tablet Commonly known as: TUMS EX       TAKE these medications    citalopram 40 MG tablet Commonly known as: CELEXA TAKE 1 TABLET DAILY What changed: when to take this   FreeStyle Libre 3 Sensor Misc 1 Device by Does not apply route every 14 (fourteen) days.   ibuprofen 600 MG tablet Commonly known as: ADVIL Take 1 tablet (600 mg total) by mouth every 6 (six) hours as needed.   insulin glargine-yfgn 100 UNIT/ML injection Commonly known as: Semglee (yfgn) Inject 0.6 mLs (60 Units total) into the skin daily. Insulin pen What changed:  how much to take when to take this   insulin lispro 100 UNIT/ML KwikPen Commonly known as: HumaLOG KwikPen Max daily 110 units What changed:  how much to take when to take this additional instructions    Insulin Pen Needle 31G X 5 MM Misc 1 Device by Does not apply route as directed.   metFORMIN 750 MG 24 hr tablet Commonly known as: GLUCOPHAGE-XR Take 2 tablets (1,500 mg total) by mouth daily. What changed: when to take this   [facility] by Does not apply route.   OneTouch Ultra test strip Generic drug: glucose blood Use as instructed to test blood sugar 2 times daily E11.9   onetouch ultrasoft lancets Use as instructed to test blood sugar 2 times daily E11.9   oxyCODONE 5 MG immediate release tablet Commonly known as: Roxicodone Take 1 tablet (5 mg total) by mouth every 4 (four) hours as needed for severe pain.   PRE-NATAL PO Take 1 tablet by mouth every evening.               Discharge Care Instructions  (From admission, onward)           Start     Ordered   04/15/22 0000  Discharge wound care:       Comments: For a cesarean delivery: You may wash incision with soap and water.  Do not soak or submerge the incision for 2 weeks. Keep incision dry. You may need to keep a sanitary pad or panty liner between the incision and your clothing for comfort and to keep the incision dry. If you note drainage, increased pain, or increased redness of the incision, then please notify your physician.   04/15/22 1038   04/15/22 0000  If the dressing is still on your incision site when you go home, remove it on the third day after your surgery date. Remove dressing if it begins to fall off, or if it is dirty or damaged before the third day.       Comments: For a cesarean delivery   04/15/22 1038             Discharge home in stable condition Infant Feeding: Breast Infant Disposition:home with mother Discharge instruction: per After Visit Summary and Postpartum booklet. Activity: Advance as tolerated. Pelvic rest for 6 weeks.  Diet: carb modified diet Anticipated Birth Control: Unsure Postpartum Appointment:1 week Future Appointments: Future  Appointments  Date Time Provider [place]  05/15/2022 10:40 AM Midge Minium, MD [facility]  08/12/2022 10:10 AM Shamleffer, Melanie Crazier, MD [facility] None   Follow up Visit:  Follow-up Information  Vanessa Kick, MD Follow up on 04/21/2022.   Specialty: Obstetrics and Gynecology Why: at 1:45 for a BP check Contact information: [address] [place] [address] [phone]                     04/15/2022 Vanessa Kick, MD

## 2022-04-16 LAB — SURGICAL PATHOLOGY

## 2022-04-24 ENCOUNTER — Telehealth (HOSPITAL_COMMUNITY): Payer: Self-pay | Admitting: *Deleted

## 2022-04-24 NOTE — Telephone Encounter (Signed)
Mom reports feeling good. Incision healing well per mom. No concerns regarding herself at this time. EPDS declined per mom reporting she's feeling emotionally the same as in hospital (hospital score=5). Reports she had postpartum visit with OB this week. Mom reports baby is well. Feeding, peeing, and pooping without difficulty. Pediatrician is guiding mom with tongue-tie issue. Mom has no concerns about baby at present.  Odis Hollingshead, RN 04-24-2022 at 10:30am

## 2022-05-15 ENCOUNTER — Encounter: Payer: BC Managed Care – PPO | Admitting: Family Medicine

## 2022-06-03 ENCOUNTER — Other Ambulatory Visit: Payer: Self-pay

## 2022-06-03 ENCOUNTER — Telehealth: Payer: Self-pay | Admitting: Family Medicine

## 2022-06-03 DIAGNOSIS — F411 Generalized anxiety disorder: Secondary | ICD-10-CM

## 2022-06-03 MED ORDER — CITALOPRAM HYDROBROMIDE 40 MG PO TABS: 40.0000 mg | ORAL_TABLET | Freq: Every day | ORAL | 3 refills | Status: DC

## 2022-06-03 NOTE — Telephone Encounter (Signed)
Informed pt that the refill has been sent in

## 2022-06-03 NOTE — Telephone Encounter (Signed)
Encourage patient to contact the pharmacy for refills or they can request refills through [facility]  (Please schedule appointment if patient has not been seen in over a year)    WHAT PHARMACY WOULD THEY LIKE THIS SENT TO: [id number] - [place], [place] - [address]   MEDICATION NAME & DOSE: citalopram 40 mg  NOTES/COMMENTS FROM PATIENT: pt called stating that she has physical with Dr.Tabori on 06/10/22. Pt also states that she would be out of medication by then, need a refill before physical.      Front office please notify patient: It takes 48-72 hours to process rx refill requests Ask patient to call pharmacy to ensure rx is ready before heading there.

## 2022-06-10 ENCOUNTER — Other Ambulatory Visit: Payer: Self-pay

## 2022-06-10 ENCOUNTER — Ambulatory Visit (INDEPENDENT_AMBULATORY_CARE_PROVIDER_SITE_OTHER): Payer: BC Managed Care – PPO | Admitting: Family Medicine

## 2022-06-10 ENCOUNTER — Encounter: Payer: Self-pay | Admitting: Family Medicine

## 2022-06-10 VITALS — BP 122/82 | HR 63 | Temp 98.4°F | Resp 17 | Ht 65.0 in | Wt 208.4 lb

## 2022-06-10 DIAGNOSIS — Z0000 Encounter for general adult medical examination without abnormal findings: Secondary | ICD-10-CM

## 2022-06-10 DIAGNOSIS — E669 Obesity, unspecified: Secondary | ICD-10-CM | POA: Insufficient documentation

## 2022-06-10 DIAGNOSIS — E119 Type 2 diabetes mellitus without complications: Secondary | ICD-10-CM

## 2022-06-10 DIAGNOSIS — E559 Vitamin D deficiency, unspecified: Secondary | ICD-10-CM

## 2022-06-10 DIAGNOSIS — F411 Generalized anxiety disorder: Secondary | ICD-10-CM

## 2022-06-10 DIAGNOSIS — Z794 Long term (current) use of insulin: Secondary | ICD-10-CM

## 2022-06-10 LAB — COMPREHENSIVE METABOLIC PANEL
ALT: 73 U/L — ABNORMAL HIGH (ref 0–35)
AST: 52 U/L — ABNORMAL HIGH (ref 0–37)
Albumin: 4.5 g/dL (ref 3.5–5.2)
Alkaline Phosphatase: 77 U/L (ref 39–117)
BUN: 10 mg/dL (ref 6–23)
CO2: 33 mEq/L — ABNORMAL HIGH (ref 19–32)
Calcium: 9.5 mg/dL (ref 8.4–10.5)
Chloride: 97 mEq/L (ref 96–112)
Creatinine, Ser: 0.54 mg/dL (ref 0.40–1.20)
GFR: 121.87 mL/min (ref >60.00)
Glucose, Bld: 116 mg/dL — ABNORMAL HIGH (ref 70–99)
Potassium: 4.2 mEq/L (ref 3.5–5.1)
Sodium: 138 mEq/L (ref 135–145)
Total Bilirubin: 0.7 mg/dL (ref 0.2–1.2)
Total Protein: 7.7 g/dL (ref 6.0–8.3)

## 2022-06-10 LAB — LIPID PANEL
Cholesterol: 208 mg/dL — ABNORMAL HIGH (ref 0–200)
HDL: 47.8 mg/dL (ref >39.00)
LDL Cholesterol: 124 mg/dL — ABNORMAL HIGH (ref 0–99)
NonHDL: 160.55
Total CHOL/HDL Ratio: 4
Triglycerides: 182 mg/dL — ABNORMAL HIGH (ref 0.0–149.0)
VLDL: 36.4 mg/dL (ref 0.0–40.0)

## 2022-06-10 LAB — HEMOGLOBIN A1C: Hgb A1c MFr Bld: 7 % — ABNORMAL HIGH (ref 4.6–6.5)

## 2022-06-10 LAB — VITAMIN D 25 HYDROXY (VIT D DEFICIENCY, FRACTURES): VITD: 29.93 ng/mL — ABNORMAL LOW (ref 30.00–100.00)

## 2022-06-10 MED ORDER — CITALOPRAM HYDROBROMIDE 40 MG PO TABS: 40.0000 mg | ORAL_TABLET | Freq: Every day | ORAL | 3 refills | Status: DC

## 2022-06-10 NOTE — Assessment & Plan Note (Signed)
Check labs and replete prn. 

## 2022-06-10 NOTE — Patient Instructions (Addendum)
Follow up in 6 months to recheck blood pressure, cholesterol We'll notify you of your lab results and make any changes if needed Schedule your eye exam at your convenience and have them send me a copy of their report Keep up the good work!  You're doing great!!! Call with any questions or concerns Stay Safe!  Stay Healthy! Happy Holidays!!!

## 2022-06-10 NOTE — Progress Notes (Signed)
   Subjective:    Patient ID: Kimberly Diaz, female    DOB: January 12, 1991, 31 y.o.   MRN: [id number]  HPI CPE- UTD on pap, foot exam, microalbumin, Tdap.  Declines flu.  Due for eye exam.  Patient Care Team    Relationship Specialty Notifications Start End  Sheliah Hatch, MD PCP - General Family Medicine  09/20/14   Waynard Reeds, MD PCP - OBGYN Obstetrics and Gynecology  12/09/21   Magda Kiel, MD  Obstetrics and Gynecology  09/19/14     Health Maintenance  Topic Date Due   OPHTHALMOLOGY EXAM  05/13/2022   INFLUENZA VACCINE  10/04/2022 (Originally 02/03/2022)   HEMOGLOBIN A1C  09/25/2022   FOOT EXAM  03/06/2023   Diabetic kidney evaluation - GFR measurement  04/13/2023   Diabetic kidney evaluation - Urine ACR  04/13/2023   PAP SMEAR-Modifier  01/02/2025   DTaP/Tdap/Td (4 - Td or Tdap) 03/05/2032   Hepatitis C Screening  Completed   HIV Screening  Completed   HPV VACCINES  Aged Out   COVID-19 Vaccine  Discontinued      Review of Systems Patient reports no vision/ hearing changes, adenopathy,fever, weight change,  persistant/recurrent hoarseness , swallowing issues, chest pain, palpitations, edema, persistant/recurrent cough, hemoptysis, dyspnea (rest/exertional/paroxysmal nocturnal), gastrointestinal bleeding (melena, rectal bleeding), abdominal pain, significant heartburn, bowel changes, GU symptoms (dysuria, hematuria, incontinence), Gyn symptoms (abnormal  bleeding, pain),  syncope, focal weakness, memory loss, numbness & tingling, skin/hair/nail changes, abnormal bruising or bleeding, anxiety, or depression.     Objective:   Physical Exam General Appearance:    Alert, cooperative, no distress, appears stated age, obese  Head:    Normocephalic, without obvious abnormality, atraumatic  Eyes:    PERRL, conjunctiva/corneas clear, EOM's intact both eyes  Ears:    Normal TM's and external ear canals, both ears  Nose:   Nares normal, septum midline, mucosa normal, no drainage     or sinus tenderness  Throat:   Lips, mucosa, and tongue normal; teeth and gums normal  Neck:   Supple, symmetrical, trachea midline, no adenopathy;    Thyroid: no enlargement/tenderness/nodules  Back:     Symmetric, no curvature, ROM normal, no CVA tenderness  Lungs:     Clear to auscultation bilaterally, respirations unlabored  Chest Wall:    No tenderness or deformity   Heart:    Regular rate and rhythm, S1 and S2 normal, no murmur, rub   or gallop  Breast Exam:    Deferred to GYN  Abdomen:     Soft, non-tender, bowel sounds active all four quadrants,    no masses, no organomegaly  Genitalia:    Deferred to GYN  Rectal:    Extremities:   Extremities normal, atraumatic, no cyanosis or edema  Pulses:   2+ and symmetric all extremities  Skin:   Skin color, texture, turgor normal, no rashes or lesions  Lymph nodes:   Cervical, supraclavicular, and axillary nodes normal  Neurologic:   CNII-XII intact, normal strength, sensation and reflexes    throughout          Assessment & Plan:

## 2022-06-10 NOTE — Assessment & Plan Note (Signed)
Pt had a C-section on 10/9.  Is working on losing weight but told her to give her body time to heal.  Will continue to follow.

## 2022-06-10 NOTE — Assessment & Plan Note (Signed)
Pt's PE WNL w/ exception of BMI.  UTD on pap, Tdap.  Declines flu.  Pt to schedule eye exam at her convenience.  Check labs.  Anticipatory guidance provided.

## 2022-06-11 ENCOUNTER — Other Ambulatory Visit: Payer: Self-pay

## 2022-06-11 ENCOUNTER — Telehealth: Payer: Self-pay

## 2022-06-11 DIAGNOSIS — R748 Abnormal levels of other serum enzymes: Secondary | ICD-10-CM

## 2022-06-11 NOTE — Telephone Encounter (Signed)
-----   Message from Sheliah Hatch, MD sent at 06/11/2022  4:32 PM EST ----- Vit D is just slightly low.  Please make sure you are taking a daily prenatal vitamin that has Vit D in it.  I don't want to do any big dose supplements as you are breastfeeding  A1C has increased from 5.5 -->7%  I will forward your results to Endocrinology to see if they want to make any med changes  Total cholesterol and LDL (bad cholesterol) have both increased.  We will hold on starting a cholesterol medication at this time, but this will improve w/ improved sugar control as well as healthy diet and regular physical activity  Your liver enzymes have gone back up to levels we saw around May of 2022.  Please hold tylenol (and alcohol if any) and we will repeat liver functions at a lab only visit in 2 weeks (dx elevated liver enzymes).  Again, this will likely improves as your sugar control improves

## 2022-06-11 NOTE — Telephone Encounter (Signed)
Informed pt of lab results . Placed repeat liver function order in and pt is coming on 06/24/22 to have drew .

## 2022-06-24 ENCOUNTER — Telehealth: Payer: Self-pay

## 2022-06-24 ENCOUNTER — Other Ambulatory Visit (INDEPENDENT_AMBULATORY_CARE_PROVIDER_SITE_OTHER): Payer: BC Managed Care – PPO

## 2022-06-24 DIAGNOSIS — R748 Abnormal levels of other serum enzymes: Secondary | ICD-10-CM

## 2022-06-24 DIAGNOSIS — M9901 Segmental and somatic dysfunction of cervical region: Secondary | ICD-10-CM | POA: Diagnosis not present

## 2022-06-24 DIAGNOSIS — M5411 Radiculopathy, occipito-atlanto-axial region: Secondary | ICD-10-CM | POA: Diagnosis not present

## 2022-06-24 DIAGNOSIS — M25511 Pain in right shoulder: Secondary | ICD-10-CM | POA: Diagnosis not present

## 2022-06-24 LAB — HEPATIC FUNCTION PANEL
ALT: 52 U/L — ABNORMAL HIGH (ref 0–35)
AST: 39 U/L — ABNORMAL HIGH (ref 0–37)
Albumin: 4.3 g/dL (ref 3.5–5.2)
Alkaline Phosphatase: 66 U/L (ref 39–117)
Bilirubin, Direct: 0.1 mg/dL (ref 0.0–0.3)
Total Bilirubin: 0.7 mg/dL (ref 0.2–1.2)
Total Protein: 7.6 g/dL (ref 6.0–8.3)

## 2022-06-24 NOTE — Telephone Encounter (Signed)
-----   Message from Sheliah Hatch, MD sent at 06/24/2022 12:37 PM EST ----- Liver functions are improving.  Great news!

## 2022-06-24 NOTE — Telephone Encounter (Signed)
Left lab results on VM 

## 2022-08-12 ENCOUNTER — Encounter: Payer: Self-pay | Admitting: Internal Medicine

## 2022-08-12 ENCOUNTER — Ambulatory Visit (INDEPENDENT_AMBULATORY_CARE_PROVIDER_SITE_OTHER): Payer: BC Managed Care – PPO | Admitting: Internal Medicine

## 2022-08-12 ENCOUNTER — Other Ambulatory Visit: Payer: Self-pay

## 2022-08-12 VITALS — BP 136/84 | HR 84 | Ht 65.0 in | Wt 215.0 lb

## 2022-08-12 DIAGNOSIS — Z794 Long term (current) use of insulin: Secondary | ICD-10-CM

## 2022-08-12 DIAGNOSIS — E1165 Type 2 diabetes mellitus with hyperglycemia: Secondary | ICD-10-CM | POA: Diagnosis not present

## 2022-08-12 MED ORDER — GLIPIZIDE 5 MG PO TABS: 5.0000 mg | ORAL_TABLET | Freq: Two times a day (BID) | ORAL | 3 refills | Status: DC

## 2022-08-12 MED ORDER — METFORMIN HCL ER 750 MG PO TB24: 1500.0000 mg | ORAL_TABLET | Freq: Every day | ORAL | 0 refills | Status: DC

## 2022-08-12 MED ORDER — METFORMIN HCL ER 750 MG PO TB24: 1500.0000 mg | ORAL_TABLET | Freq: Every day | ORAL | 3 refills | Status: DC

## 2022-08-12 MED ORDER — INSULIN GLARGINE-YFGN 100 UNIT/ML ~~LOC~~ SOLN: 40.0000 [IU] | Freq: Every day | SUBCUTANEOUS | 3 refills | Status: DC

## 2022-08-12 MED ORDER — GLIPIZIDE 5 MG PO TABS: 5.0000 mg | ORAL_TABLET | Freq: Two times a day (BID) | ORAL | 0 refills | Status: DC

## 2022-08-12 NOTE — Progress Notes (Signed)
Name: Kimberly Diaz  Age/ Sex: 32 y.o., female   MRN/ DOB: [id number], 1990/11/01     PCP: Kimberly Minium, MD   Reason for Endocrinology Evaluation: Type 2 Diabetes Mellitus  Initial Endocrine Consultative Visit: 09/06/2019    PATIENT IDENTIFIER: Kimberly Diaz is a 32 y.o. female with a past medical history of T2DM and PCOS. The patient has followed with Endocrinology clinic since 09/06/2019 for consultative assistance with management of her diabetes.  DIABETIC HISTORY:  Kimberly Diaz was diagnosed with DM in 2016, she has been on metformin since her diagnosis. She has required insulin during 3rd trimester in 2018. Her hemoglobin A1c has ranged from 5.8% in 2018, peaking at 9.0% in 2019.  On her initial visit to our clinic her A1c was 9.8% she was on metformin only, we started her on Glyburide and gradual titration of the dose.   She was switched to MDI regimen based on OB recommendations 07/2020 Ozempic started 01/2021 but was discontinued in preparation for pregnancy  Pt has a 81 yr old daughter at home S/P C-section 04/13/2022- Kimberly Diaz    SUBJECTIVE:   During the last visit (03/27/2022): A1c 5.5 %     Today (08/12/2022): Kimberly Diaz is here for a follow up on diabetes. She has been checking  glucose multiple times a day  through the CGM.  The patient has not  had hypoglycemic episodes since the last clinic visit.  She is S/P c-section 10/9/2023Harmon Diaz  She is nursing  Denies nausea, vomiting or diarrhea  Denies LE edema      HOME DIABETES REGIMEN:  Metformin XR 750 mg, 2 tabs daily Semglee 32 units daily  Correction factor: Humalog ( BG -90/10) - not taking      CONTINUOUS GLUCOSE MONITORING RECORD INTERPRETATION    Dates of Recording:1/22-08/09/2022 Sensor description: freestyle libre 3  Results statistics:   CGM use % of time 57  Average and SD 288/18.4  Time in range 1  % Time Above 180 27  % Time above 250 72  % Time Below target 0     Glycemic  patterns summary: Hyperglycemia noted during the day and night  Hyperglycemic episodes all day and night  Hypoglycemic episodes occurred  n/a  Overnight periods: Remains high     DIABETIC COMPLICATIONS: Microvascular complications:    Denies: CKD, neuropathy  Last eye exam: Completed 2022   Macrovascular complications:    Denies: CAD, PVD, CVA   HISTORY:  Past Medical History:  Past Medical History:  Diagnosis Date   Anxiety    Chronic back pain    muscle stiffness   Diabetes mellitus without complication (HCC)    metformin   Fatty liver    Hx of varicella    PCOS (polycystic ovarian syndrome)    possible    Past Surgical History:  Past Surgical History:  Procedure Laterality Date   CESAREAN SECTION N/A 11/06/2016   Procedure: CESAREAN SECTION;  Surgeon: Jerelyn Charles, MD;  Location: [facility];  Service: Obstetrics;  Laterality: N/A;   CESAREAN SECTION N/A 04/13/2022   Procedure: CESAREAN SECTION;  Surgeon: Jerelyn Charles, MD;  Location: [facility];  Service: Obstetrics;  Laterality: N/A;   EYE SURGERY     WISDOM TOOTH EXTRACTION  07/06/2010   Social History:  reports that she has never smoked. She has never used smokeless tobacco. She reports that she does not drink alcohol and does not use drugs. Family History:  Family History  Problem Relation  Age of Onset   Hypertension Father    Diabetes Father    Cancer Maternal Grandfather    Heart disease Maternal Grandfather    Cancer Paternal Grandfather    Breast cancer Paternal Grandmother      HOME MEDICATIONS: Allergies as of 08/12/2022   No Known Allergies      Medication List        Accurate as of August 12, 2022  3:54 PM. If you have any questions, ask your nurse or doctor.          citalopram 40 MG tablet Commonly known as: CELEXA Take 1 tablet (40 mg total) by mouth daily.   FreeStyle Libre 3 Sensor Misc 1 Device by Does not apply route every 14 (fourteen) days.   glipiZIDE 5 MG  tablet Commonly known as: GLUCOTROL Take 1 tablet (5 mg total) by mouth 2 (two) times daily before a meal. Started by: Cayarel L Hendricks, CMA   insulin glargine-yfgn 100 UNIT/ML injection Commonly known as: Semglee (yfgn) Inject 0.4 mLs (40 Units total) into the skin daily. Insulin pen What changed: how much to take Changed by: Dorita Sciara, MD   insulin lispro 100 UNIT/ML KwikPen Commonly known as: HumaLOG KwikPen Max daily 110 units   Insulin Pen Needle 31G X 5 MM Misc 1 Device by Does not apply route as directed.   metFORMIN 750 MG 24 hr tablet Commonly known as: GLUCOPHAGE-XR Take 2 tablets (1,500 mg total) by mouth daily. What changed: when to take this   [place] by Does not apply route.   OneTouch Ultra test strip Generic drug: glucose blood Use as instructed to test blood sugar 2 times daily E11.9   onetouch ultrasoft lancets Use as instructed to test blood sugar 2 times daily E11.9   PRE-NATAL PO Take 1 tablet by mouth every evening.         OBJECTIVE:   Vital Signs:BP 136/84 (BP Location: Right Arm, Patient Position: Sitting, Cuff Size: Large)   Pulse 84   Ht 5\' 5"  (1.651 m)   Wt 215 lb (97.5 kg)   SpO2 98%   BMI 35.78 kg/m   Wt Readings from Last 3 Encounters:  08/12/22 215 lb (97.5 kg)  06/10/22 208 lb 6 oz (94.5 kg)  04/12/22 231 lb 12.8 oz (105.1 kg)     Exam: General: Pt appears well and is in NAD  Lungs: Clear with good BS bilat with no rales, rhonchi, or wheezes  Heart: RRR   Extremities: No pretibial edema.   Neuro: MS is good with appropriate affect, pt is alert and Ox3         DM foot exam: 03/05/2022    The skin of the feet is intact without sores or ulcerations. The pedal pulses are 2+ on right and 2+ on left. The sensation is intact to a screening 5.07, 10 gram monofilament bilaterally    DATA REVIEWED:  Lab Results  Component Value Date   HGBA1C 7.0 (H) 06/10/2022   HGBA1C 5.5  03/27/2022   HGBA1C 5.6 03/05/2022    Latest Reference Range & Units 06/10/22 12:22 06/24/22 10:01  Sodium 135 - 145 mEq/L 138   Potassium 3.5 - 5.1 mEq/L 4.2   Chloride 96 - 112 mEq/L 97   CO2 19 - 32 mEq/L 33 (H)   Glucose 70 - 99 mg/dL 116 (H)   BUN 6 - 23 mg/dL 10   Creatinine 0.40 - 1.20 mg/dL 0.54  Calcium 8.4 - 10.5 mg/dL 9.5   Alkaline Phosphatase 39 - 117 U/L 77 66  Albumin 3.5 - 5.2 g/dL 4.5 4.3  AST 0 - 37 U/L 52 (H) 39 (H)  ALT 0 - 35 U/L 73 (H) 52 (H)  Total Protein 6.0 - 8.3 g/dL 7.7 7.6  Bilirubin, Direct 0.0 - 0.3 mg/dL  0.1  Total Bilirubin 0.2 - 1.2 mg/dL 0.7 0.7  GFR >60.00 mL/min 121.87     ASSESSMENT / PLAN / RECOMMENDATIONS:   1) Type 2 Diabetes Mellitus, Optimally controlled, Without complications - Most recent A1c of 7.0%. Goal A1c < 7.0 %.    -Patient has been noted with hypoglycemia on CGM download, unfortunately she has been very busy with the new baby and unable to pay attention to self-care -She has not been able to take the Humalog -We discussed increasing Semglee as below and started her on glipizide to be taken before the first and the last meal of the day -We will stop the Humalog as it is causing hardship on the patient at this time   MEDICATIONS: -Continue metformin 750 mg XR to  2 tabs daily -Increase Semglee 40 units daily  -Stop Humalog -Start glipizide 5 mg, 1 tablet before breakfast and 1 tablet before supper    EDUCATION / INSTRUCTIONS: BG monitoring instructions: Patient is instructed to check her blood sugars 7 times a day, before each meal, 2 hours postprandial, and bedtime Call [facility] if: BG persistently < 70 I reviewed the Rule of 15 for the treatment of hypoglycemia in detail with the patient. Literature supplied.     2) Diabetic complications:  Eye: Does not have known diabetic retinopathy.  Neuro/ Feet: Does not have known diabetic peripheral neuropathy .  Renal: Patient does not have known  baseline CKD.       F/U in 4 months    Signed electronically by: Mack Guise, MD  [facility]  [facility] [address] [place], [place] [address] Phone: [phone] FAX: [phone]   CC: Kimberly Minium, MD [address] [place] [address] Phone: [phone]  Fax: [phone]  Return to Endocrinology clinic as below: Future Appointments  Date Time Provider [place]  12/10/2022 10:00 AM Kimberly Minium, MD [facility]  12/28/2022  8:50 AM Larin Depaoli, Melanie Crazier, MD [facility] None

## 2022-08-12 NOTE — Patient Instructions (Signed)
-   Continue Metformin 750 mg, 2 tablets a day - Increase Semglee 40 units daily  - Start Glipizide 5 mg , 1 tablet before Breakfast and 1 tablet before Supper   HOW TO TREAT LOW BLOOD SUGARS (Blood sugar LESS THAN 60 MG/DL) Please follow the RULE OF 15 for the treatment of hypoglycemia treatment (when your (blood sugars are less than 60 mg/dL)   STEP 1: Take 15 grams of carbohydrates when your blood sugar is low, which includes:  3-4 GLUCOSE TABS  OR 3-4 OZ OF JUICE OR REGULAR SODA OR ONE TUBE OF GLUCOSE GEL    STEP 2: RECHECK blood sugar in 15 MINUTES STEP 3: If your blood sugar is still low at the 15 minute recheck --> then, go back to STEP 1 and treat AGAIN with another 15 grams of carbohydrates.

## 2022-08-13 ENCOUNTER — Encounter: Payer: Self-pay | Admitting: Internal Medicine

## 2022-12-10 ENCOUNTER — Telehealth: Payer: Self-pay

## 2022-12-10 ENCOUNTER — Ambulatory Visit (INDEPENDENT_AMBULATORY_CARE_PROVIDER_SITE_OTHER): Payer: BC Managed Care – PPO | Admitting: Family Medicine

## 2022-12-10 ENCOUNTER — Encounter: Payer: Self-pay | Admitting: Family Medicine

## 2022-12-10 ENCOUNTER — Other Ambulatory Visit: Payer: Self-pay

## 2022-12-10 VITALS — BP 126/82 | HR 77 | Temp 97.9°F | Resp 18 | Ht 65.0 in | Wt 206.0 lb

## 2022-12-10 DIAGNOSIS — E781 Pure hyperglyceridemia: Secondary | ICD-10-CM | POA: Diagnosis not present

## 2022-12-10 DIAGNOSIS — R7989 Other specified abnormal findings of blood chemistry: Secondary | ICD-10-CM

## 2022-12-10 DIAGNOSIS — E669 Obesity, unspecified: Secondary | ICD-10-CM | POA: Diagnosis not present

## 2022-12-10 DIAGNOSIS — Z6834 Body mass index (BMI) 34.0-34.9, adult: Secondary | ICD-10-CM

## 2022-12-10 LAB — BASIC METABOLIC PANEL
BUN: 9 mg/dL (ref 6–23)
CO2: 30 mEq/L (ref 19–32)
Calcium: 9.4 mg/dL (ref 8.4–10.5)
Chloride: 100 mEq/L (ref 96–112)
Creatinine, Ser: 0.55 mg/dL (ref 0.40–1.20)
GFR: 120.9 mL/min (ref >60.00)
Glucose, Bld: 148 mg/dL — ABNORMAL HIGH (ref 70–99)
Potassium: 4.4 mEq/L (ref 3.5–5.1)
Sodium: 137 mEq/L (ref 135–145)

## 2022-12-10 LAB — CBC WITH DIFFERENTIAL/PLATELET
Basophils Absolute: 0.1 10*3/uL (ref 0.0–0.1)
Basophils Relative: 0.7 % (ref 0.0–3.0)
Eosinophils Absolute: 0.4 10*3/uL (ref 0.0–0.7)
Eosinophils Relative: 4.5 % (ref 0.0–5.0)
HCT: 42.9 % (ref 36.0–46.0)
Hemoglobin: 14.7 g/dL (ref 12.0–15.0)
Lymphocytes Relative: 38.2 % (ref 12.0–46.0)
Lymphs Abs: 3.4 10*3/uL (ref 0.7–4.0)
MCHC: 34.3 g/dL (ref 30.0–36.0)
MCV: 87.5 fl (ref 78.0–100.0)
Monocytes Absolute: 0.4 10*3/uL (ref 0.1–1.0)
Monocytes Relative: 4.6 % (ref 3.0–12.0)
Neutro Abs: 4.7 10*3/uL (ref 1.4–7.7)
Neutrophils Relative %: 52 % (ref 43.0–77.0)
Platelets: 295 10*3/uL (ref 150.0–400.0)
RBC: 4.91 Mil/uL (ref 3.87–5.11)
RDW: 12.8 % (ref 11.5–15.5)
WBC: 9 10*3/uL (ref 4.0–10.5)

## 2022-12-10 LAB — LIPID PANEL
Cholesterol: 185 mg/dL (ref 0–200)
HDL: 45.2 mg/dL (ref >39.00)
LDL Cholesterol: 115 mg/dL — ABNORMAL HIGH (ref 0–99)
NonHDL: 139.96
Total CHOL/HDL Ratio: 4
Triglycerides: 124 mg/dL (ref 0.0–149.0)
VLDL: 24.8 mg/dL (ref 0.0–40.0)

## 2022-12-10 LAB — HEPATIC FUNCTION PANEL
ALT: 119 U/L — ABNORMAL HIGH (ref 0–35)
AST: 102 U/L — ABNORMAL HIGH (ref 0–37)
Albumin: 4.5 g/dL (ref 3.5–5.2)
Alkaline Phosphatase: 75 U/L (ref 39–117)
Bilirubin, Direct: 0.2 mg/dL (ref 0.0–0.3)
Total Bilirubin: 0.8 mg/dL (ref 0.2–1.2)
Total Protein: 8.1 g/dL (ref 6.0–8.3)

## 2022-12-10 LAB — TSH: TSH: 1.75 u[IU]/mL (ref 0.35–5.50)

## 2022-12-10 NOTE — Assessment & Plan Note (Signed)
Ongoing issue.  Pt is attempting to control w/ diet and exercise.  Given her diabetes, her LDL goal is <70.  Check labs and determine if medication is needed.  Will follow.

## 2022-12-10 NOTE — Telephone Encounter (Signed)
-----   Message from Sheliah Hatch, MD sent at 12/10/2022  2:44 PM EDT ----- Total cholesterol and LDL (bad cholesterol) have both improved- this is great news!  Liver functions (AST/ALT) are up compared to last check.  Please hold tylenol and alcohol x2 weeks and we'll repeat your liver functions at a lab only visit to reassess (dx elevated LFTs)  Remainder of labs look great!

## 2022-12-10 NOTE — Patient Instructions (Signed)
Schedule your complete physical in 6 months We'll notify you of your lab results and make any changes if needed Keep up the good work on healthy diet and regular exercise- you're doing great! Call with any questions or concerns Have a great summer!!! 

## 2022-12-10 NOTE — Assessment & Plan Note (Signed)
Pt is down 10 lbs since last visit.  Applauded her efforts at increased activity.  Will continue to follow.

## 2022-12-10 NOTE — Progress Notes (Signed)
   Subjective:    Patient ID: Kimberly Diaz, female    DOB: [birth year], 32 y.o.   MRN: [id number]  HPI Hyperlipidemia- attempting to control w/ diet and exercise.  Pt reports feeling good.  No CP, SOB, HA's, abd pain, N/V.  Obesity- pt is down 10 lbs since Feb.  BMI now 34.28.  attempting to walk more.     Review of Systems For ROS see HPI     Objective:   Physical Exam Vitals reviewed.  Constitutional:      General: She is not in acute distress.    Appearance: Normal appearance. She is well-developed. She is obese.  HENT:     Head: Normocephalic and atraumatic.  Eyes:     Conjunctiva/sclera: Conjunctivae normal.     Pupils: Pupils are equal, round, and reactive to light.  Neck:     Thyroid: No thyromegaly.  Cardiovascular:     Rate and Rhythm: Normal rate and regular rhythm.     Pulses: Normal pulses.     Heart sounds: Normal heart sounds. No murmur heard. Pulmonary:     Effort: Pulmonary effort is normal. No respiratory distress.     Breath sounds: Normal breath sounds.  Abdominal:     General: There is no distension.     Palpations: Abdomen is soft.     Tenderness: There is no abdominal tenderness.  Musculoskeletal:     Cervical back: Normal range of motion and neck supple.     Right lower leg: No edema.     Left lower leg: No edema.  Lymphadenopathy:     Cervical: No cervical adenopathy.  Skin:    General: Skin is warm and dry.  Neurological:     Mental Status: She is alert and oriented to person, place, and time.  Psychiatric:        Behavior: Behavior normal.           Assessment & Plan:

## 2022-12-16 ENCOUNTER — Other Ambulatory Visit: Payer: Self-pay

## 2022-12-16 ENCOUNTER — Other Ambulatory Visit (INDEPENDENT_AMBULATORY_CARE_PROVIDER_SITE_OTHER): Payer: BC Managed Care – PPO

## 2022-12-16 DIAGNOSIS — R7989 Other specified abnormal findings of blood chemistry: Secondary | ICD-10-CM

## 2022-12-17 ENCOUNTER — Telehealth: Payer: Self-pay

## 2022-12-17 LAB — HEPATIC FUNCTION PANEL
ALT: 88 U/L — ABNORMAL HIGH (ref 0–35)
AST: 73 U/L — ABNORMAL HIGH (ref 0–37)
Albumin: 4.4 g/dL (ref 3.5–5.2)
Alkaline Phosphatase: 76 U/L (ref 39–117)
Bilirubin, Direct: 0.1 mg/dL (ref 0.0–0.3)
Total Bilirubin: 0.6 mg/dL (ref 0.2–1.2)
Total Protein: 8 g/dL (ref 6.0–8.3)

## 2022-12-17 NOTE — Telephone Encounter (Signed)
-----   Message from Sheliah Hatch, MD sent at 12/17/2022  3:32 PM EDT ----- AST and ALT are improving.  Continue to hold tylenol and alcohol.  Work on a low carb/low sugar diet and regular physical activity to improve these numbers and get them back in range.

## 2022-12-18 NOTE — Telephone Encounter (Signed)
Pt has been notified.

## 2022-12-28 ENCOUNTER — Ambulatory Visit: Payer: BC Managed Care – PPO | Admitting: Internal Medicine

## 2022-12-28 IMAGING — US US MFM OB DETAIL+14 WK
1 series · 13 of 28 positions shown · non-contrast
Comparison: none

[Series 1: us mfm ob detail+14 wk · 13 of 119 slices shown]
[im 5/119]
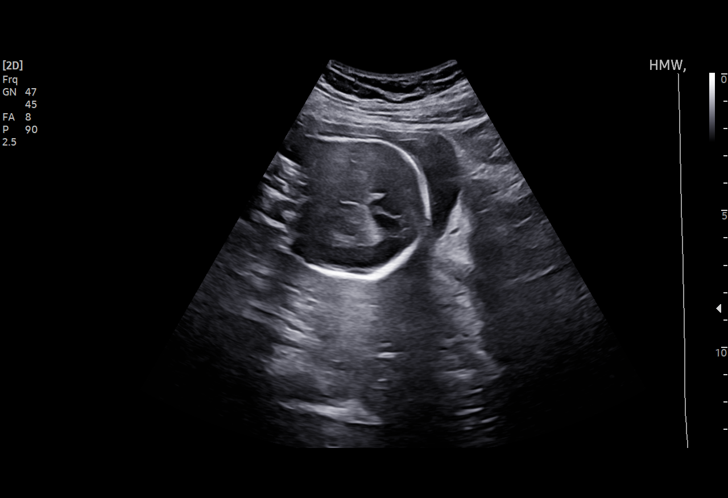
[im 14/119]
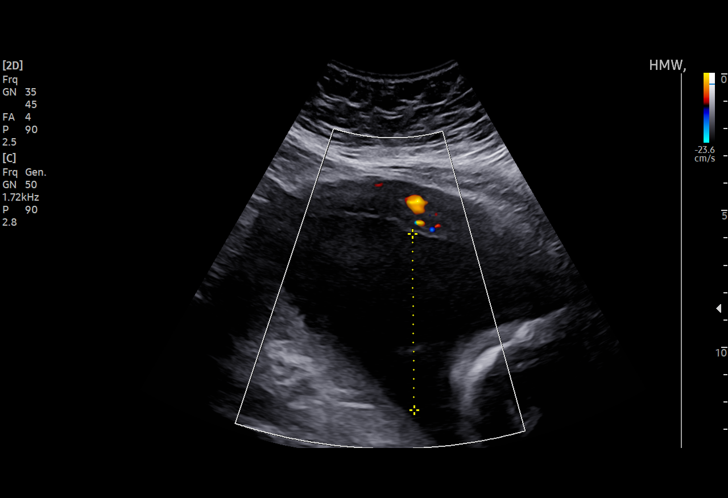
[im 22/119]
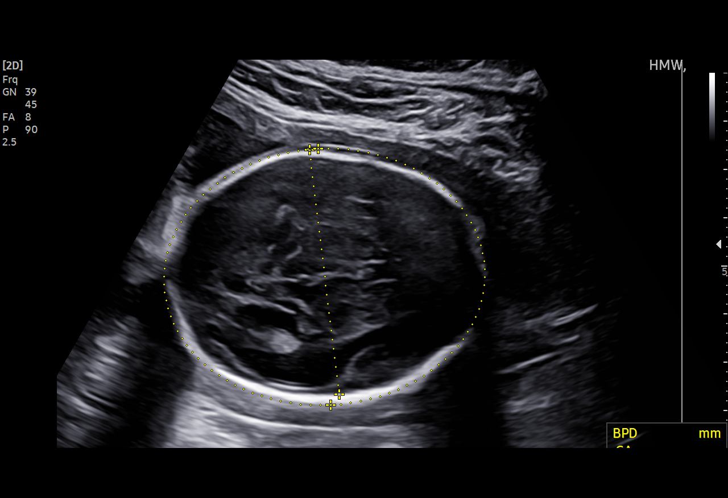
[im 31/119]
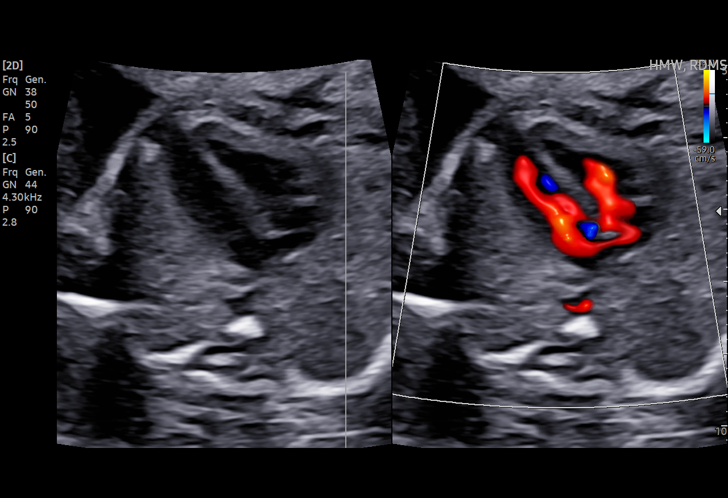
[im 40/119]
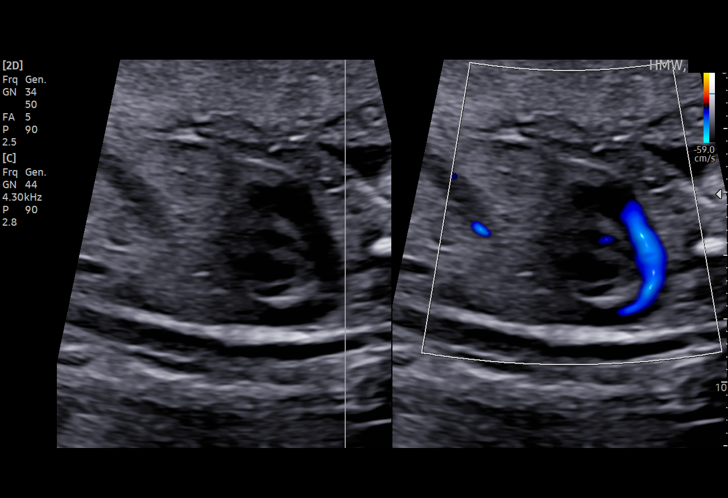
[im 49/119]
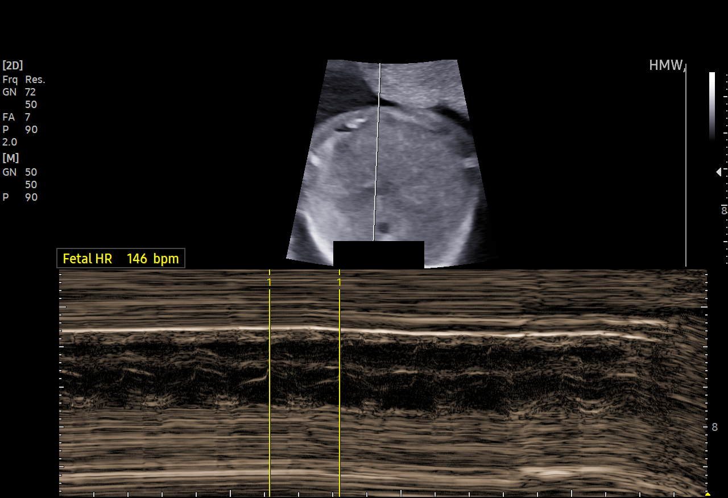
[im 62/119]
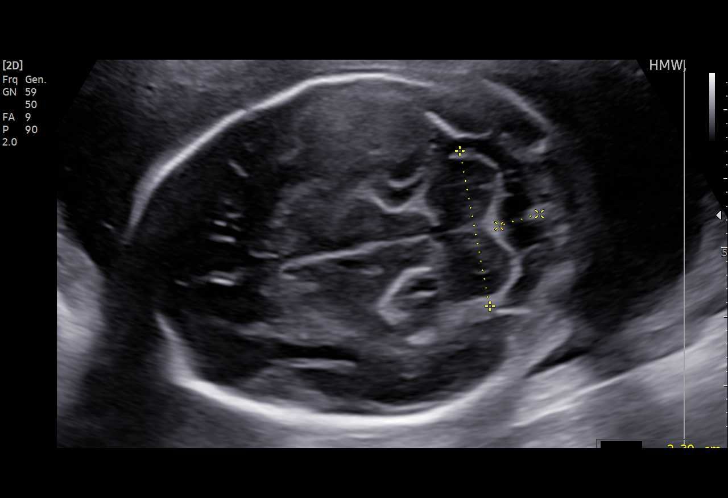
[im 70/119]
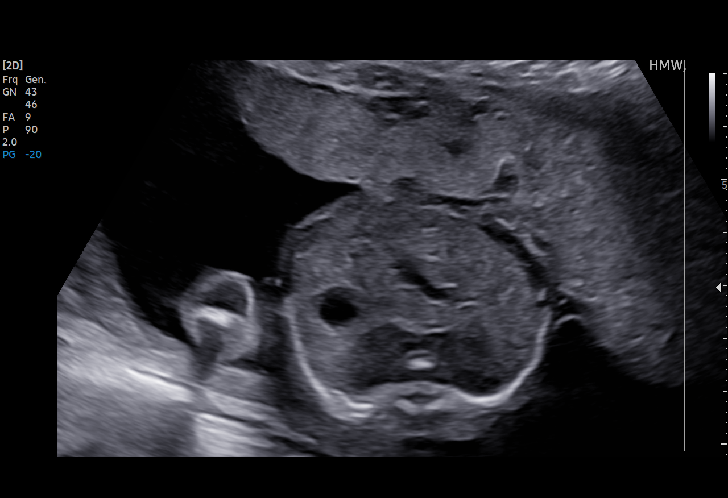
[im 79/119]
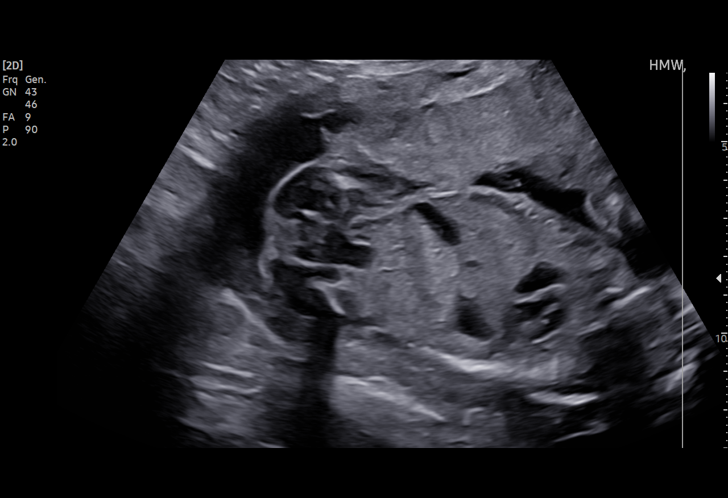
[im 88/119]
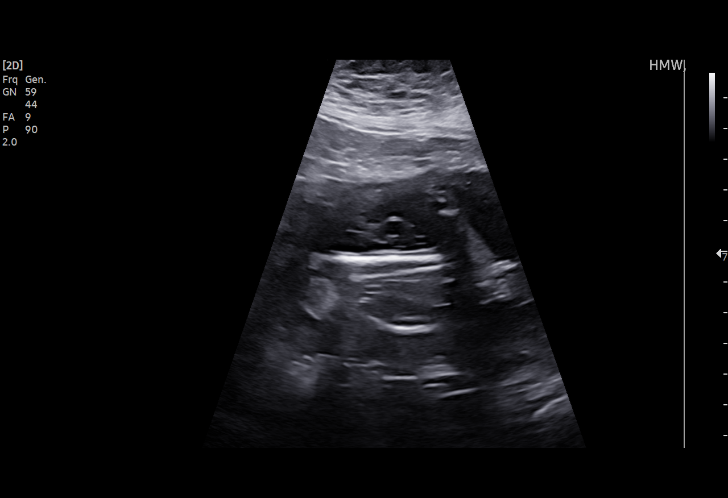
[im 97/119]
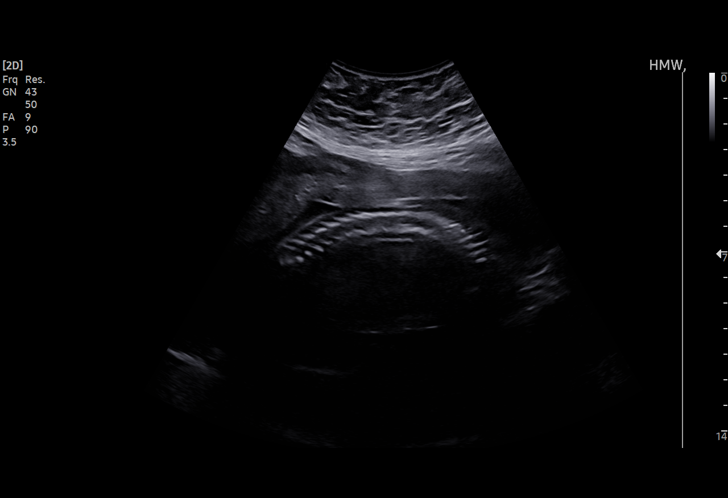
[im 105/119]
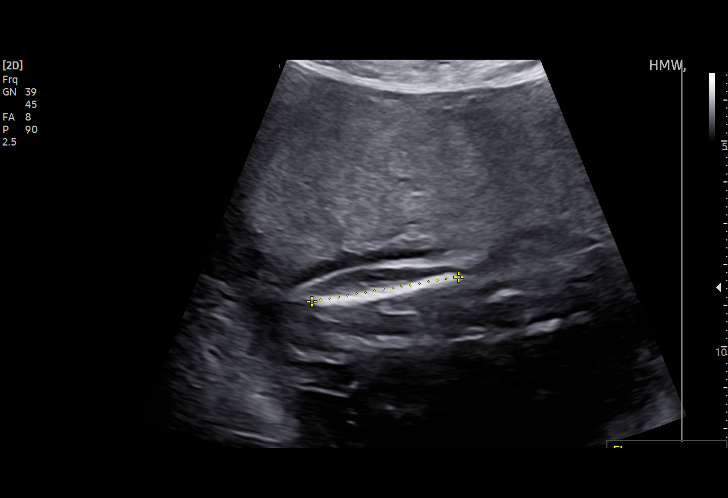
[im 114/119]
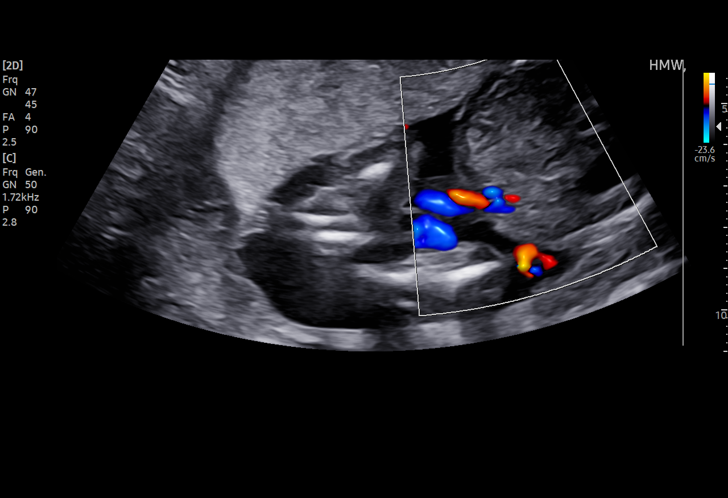

[13 of 28 positions shown; findings below may reference images not displayed]

Indications

 20 weeks gestation of pregnancy
 Pre-existing diabetes, type 2, in pregnancy,
 second trimester
 Obesity complicating pregnancy, second
 trimester BMI 35
 History of cesarean delivery, currently
 pregnant
 Encounter for antenatal screening for
 malformations
Fetal Evaluation

 Num Of Fetuses:         1
 Fetal Heart Rate(bpm):  146
 Cardiac Activity:       Observed
 Presentation:           Cephalic
 Placenta:               Anterior
 P. Cord Insertion:      Visualized

 Amniotic Fluid
 AFI FV:      Within normal limits

                             Largest Pocket(cm)

Biometry
 BPD:     51.15  mm     G. Age:  21w 4d         74  %    CI:        74.16   %    70 - 86
                                                         FL/HC:      19.1   %    15.9 -
 HC:    188.59   mm     G. Age:  21w 1d         55  %    HC/AC:      1.18        1.06 -
 AC:    160.17   mm     G. Age:  21w 1d         52  %    FL/BPD:     70.3   %
 FL:      35.96  mm     G. Age:  21w 3d         60  %    FL/AC:      22.5   %    20 - 24
 HUM:        33  mm     G. Age:  21w 1d         55  %
 CER:      23.1  mm     G. Age:  21w 3d         82  %
 NFT:       3.5  mm

 LV:        6.1  mm
 CM:        5.8  mm

 Est. FW:     410  gm    0 lb 14 oz      67  %
OB History

 Gravidity:    2         Term:   1
 Living:       1
Gestational Age

 LMP:           21w 4d        Date:  07/11/21                   EDD:   04/17/22
 U/S Today:     21w 2d                                        EDD:   04/19/22
 Best:          20w 6d     Det. By:  Early Ultrasound         EDD:   04/22/22
                                     (09/02/21)
Anatomy

 Cranium:               Appears normal         LVOT:                   Appears normal
 Cavum:                 Appears normal         Aortic Arch:            Appears normal
 Ventricles:            Appears normal         Ductal Arch:            Appears normal
 Choroid Plexus:        Appears normal         Diaphragm:              Appears normal
 Cerebellum:            Appears normal         Stomach:                Appears normal, left
                                                                       sided
 Posterior Fossa:       Appears normal         Abdomen:                Appears normal
 Nuchal Fold:           Appears normal         Abdominal Wall:         Appears nml (cord
                                                                       insert, abd wall)
 Face:                  Appears normal         Cord Vessels:           Appears normal (3
                        (orbits and profile)                           vessel cord)
 Lips:                  Appears normal         Kidneys:                Appear normal
 Palate:                Not well visualized    Bladder:                Appears normal
 Thoracic:              Appears normal         Spine:                  Appears normal
 Heart:                 Appears normal         Upper Extremities:      Appears normal
                        (4CH, axis, and
                        situs)
 RVOT:                  Appears normal         Lower Extremities:      Appears normal

 Other:  Fetus appears to be a male. Nasal bone, Lenses,Heels and 5th digit
         visualized. VC, 3VV and 3VTV visualized. Technically difficult due to
         maternal habitus and fetal position.
Cervix Uterus Adnexa

 Cervix
 Length:            3.8  cm.
 Normal appearance by transabdominal scan.
 Uterus
 No abnormality visualized.

 Right Ovary
 Within normal limits.

 Left Ovary
 Within normal limits.

 Cul De Sac
 No free fluid seen.

 Adnexa
 No abnormality visualized.
Impression

 Single intrauterine pregnancy here for a detailed anatomy
 due to type 2 diabetes
 Normal anatomy with measurements consistent with dates
 There is good fetal movement and amniotic fluid volume

 I reviewed today's study and recommend serial growth, fetal
 echocardiogram and initiation of weekly testing at 32 weeks.

 We discussed the increased risk for fetal macrosomia,
 cardiac defects, maternal and fetal birth trauma with
 temporary and/or permenant damage, stillbirth and neonatal
 ICU admission.

 In addition we discussed starting daily low dose ASA for the
 prevention of preeclampsia.

 She has a prior fetal echocardiogram scheduled.
Recommendations

 Follow up growth in 4-6 weeks.
 Fetal echocardiogram referral was previously submitted.

## 2023-01-28 DIAGNOSIS — M9901 Segmental and somatic dysfunction of cervical region: Secondary | ICD-10-CM | POA: Diagnosis not present

## 2023-01-28 DIAGNOSIS — M5411 Radiculopathy, occipito-atlanto-axial region: Secondary | ICD-10-CM | POA: Diagnosis not present

## 2023-01-28 DIAGNOSIS — M25511 Pain in right shoulder: Secondary | ICD-10-CM | POA: Diagnosis not present

## 2023-02-18 ENCOUNTER — Encounter (INDEPENDENT_AMBULATORY_CARE_PROVIDER_SITE_OTHER): Payer: Self-pay

## 2023-02-19 ENCOUNTER — Ambulatory Visit: Payer: BC Managed Care – PPO | Admitting: Internal Medicine

## 2023-03-15 ENCOUNTER — Encounter: Payer: BC Managed Care – PPO | Admitting: Family Medicine

## 2023-03-30 ENCOUNTER — Other Ambulatory Visit: Payer: Self-pay | Admitting: Internal Medicine

## 2023-04-06 ENCOUNTER — Ambulatory Visit: Payer: BC Managed Care – PPO | Admitting: Internal Medicine

## 2023-05-11 ENCOUNTER — Encounter: Payer: Self-pay | Admitting: Internal Medicine

## 2023-05-11 ENCOUNTER — Ambulatory Visit (INDEPENDENT_AMBULATORY_CARE_PROVIDER_SITE_OTHER): Payer: BC Managed Care – PPO | Admitting: Internal Medicine

## 2023-05-11 VITALS — BP 126/74 | HR 84 | Ht 65.0 in | Wt 201.0 lb

## 2023-05-11 DIAGNOSIS — E1165 Type 2 diabetes mellitus with hyperglycemia: Secondary | ICD-10-CM | POA: Diagnosis not present

## 2023-05-11 DIAGNOSIS — Z794 Long term (current) use of insulin: Secondary | ICD-10-CM | POA: Diagnosis not present

## 2023-05-11 LAB — POCT GLYCOSYLATED HEMOGLOBIN (HGB A1C): Hemoglobin A1C: 10.7 % — AB (ref 4.0–5.6)

## 2023-05-11 LAB — POCT GLUCOSE (DEVICE FOR HOME USE): POC Glucose: 350 mg/dL — AB (ref 70–99)

## 2023-05-11 MED ORDER — INSULIN GLARGINE-YFGN 100 UNIT/ML ~~LOC~~ SOLN: 26.0000 [IU] | Freq: Every day | SUBCUTANEOUS | 3 refills | Status: DC

## 2023-05-11 MED ORDER — TIRZEPATIDE 2.5 MG/0.5ML ~~LOC~~ SOAJ: 2.5000 mg | SUBCUTANEOUS | 0 refills | Status: DC

## 2023-05-11 MED ORDER — METFORMIN HCL ER 750 MG PO TB24: 1500.0000 mg | ORAL_TABLET | Freq: Every day | ORAL | 3 refills | Status: DC

## 2023-05-11 MED ORDER — TIRZEPATIDE 5 MG/0.5ML ~~LOC~~ SOAJ: 5.0000 mg | SUBCUTANEOUS | 3 refills | Status: DC

## 2023-05-11 MED ORDER — INSULIN PEN NEEDLE 31G X 5 MM MISC: 1.0000 | Freq: Every day | 3 refills | Status: DC

## 2023-05-11 NOTE — Patient Instructions (Addendum)
-   Continue Metformin 750 mg, 2 tablets a day - Start Mounjaro 2.5 mg once weekly for one month, than increase to 5 mg weekly  - Restart Semglee 26 units daily   HOW TO TREAT LOW BLOOD SUGARS (Blood sugar LESS THAN 60 MG/DL) Please follow the RULE OF 15 for the treatment of hypoglycemia treatment (when your (blood sugars are less than 60 mg/dL)   STEP 1: Take 15 grams of carbohydrates when your blood sugar is low, which includes:  3-4 GLUCOSE TABS  OR 3-4 OZ OF JUICE OR REGULAR SODA OR ONE TUBE OF GLUCOSE GEL    STEP 2: RECHECK blood sugar in 15 MINUTES STEP 3: If your blood sugar is still low at the 15 minute recheck --> then, go back to STEP 1 and treat AGAIN with another 15 grams of carbohydrates.

## 2023-05-11 NOTE — Progress Notes (Signed)
Name: Kimberly Diaz  Age/ Sex: 32 y.o., female   MRN/ DOB: [id number], 08/12/1990     PCP: Sheliah Hatch, MD   Reason for Endocrinology Evaluation: Type 2 Diabetes Mellitus  Initial Endocrine Consultative Visit: 09/06/2019    PATIENT IDENTIFIER: Kimberly Diaz is a 32 y.o. female with a past medical history of T2DM and PCOS. The patient has followed with Endocrinology clinic since 09/06/2019 for consultative assistance with management of her diabetes.  DIABETIC HISTORY:  Kimberly Diaz was diagnosed with DM in 2016, she has been on metformin since her diagnosis. She has required insulin during 3rd trimester in 2018. Her hemoglobin A1c has ranged from 5.8% in 2018, peaking at 9.0% in 2019.  On her initial visit to our clinic her A1c was 9.8% she was on metformin only, we started her on Glyburide and gradual titration of the dose.   She was switched to MDI regimen based on OB recommendations 07/2020 Ozempic started 01/2021 but was discontinued in preparation for pregnancy  Pt has a 34 yr old daughter  S/P C-section 04/13/2022- [place]  SUBJECTIVE:   During the last visit (08/12/2022): A1c 7.0%     Today (05/11/2023): Kimberly Diaz is here for a follow up on diabetes. The pt has NOT been to our clinic in 9 months.  She has been checking  glucose 0 x daily.  The patient has not  had hypoglycemic episodes since the last clinic visit.  She stopped nursing this month , denies depression She has not been on glipizide nor Semglee  Denies nausea or vomiting  Denies constipation or diarrhea   HOME DIABETES REGIMEN:  Metformin XR 750 mg, 2 tabs daily Glipizide 5 mg BID -not taking  Semglee 40 units daily - not taking     CONTINUOUS GLUCOSE MONITORING RECORD INTERPRETATION  N/A    DIABETIC COMPLICATIONS: Microvascular complications:    Denies: CKD, neuropathy  Last eye exam: Completed 2022   Macrovascular complications:    Denies: CAD, PVD, CVA   HISTORY:  Past Medical  History:  Past Medical History:  Diagnosis Date   Anxiety    Chronic back pain    muscle stiffness   Diabetes mellitus without complication (HCC)    metformin   Fatty liver    Hx of varicella    PCOS (polycystic ovarian syndrome)    possible    Past Surgical History:  Past Surgical History:  Procedure Laterality Date   CESAREAN SECTION N/A 11/06/2016   Procedure: CESAREAN SECTION;  Surgeon: Marlow Baars, MD;  [facility];  Service: Obstetrics;  Laterality: N/A;   CESAREAN SECTION N/A 04/13/2022   Procedure: CESAREAN SECTION;  Surgeon: Marlow Baars, MD;  Location: [facility];  Service: Obstetrics;  Laterality: N/A;   EYE SURGERY     WISDOM TOOTH EXTRACTION  07/06/2010   Social History:  reports that she has never smoked. She has never used smokeless tobacco. She reports that she does not drink alcohol and does not use drugs. Family History:  Family History  Problem Relation Age of Onset   Hypertension Father    Diabetes Father    Cancer Maternal Grandfather    Heart disease Maternal Grandfather    Cancer Paternal Grandfather    Breast cancer Paternal Grandmother      HOME MEDICATIONS: Allergies as of 05/11/2023   No Known Allergies      Medication List        Accurate as of May 11, 2023 11:26 AM.  If you have any questions, ask your nurse or doctor.          citalopram 40 MG tablet Commonly known as: CELEXA Take 1 tablet (40 mg total) by mouth daily.   FreeStyle Libre 3 Sensor Misc 1 Device by Does not apply route every 14 (fourteen) days.   glipiZIDE 5 MG tablet Commonly known as: GLUCOTROL Take 1 tablet (5 mg total) by mouth 2 (two) times daily before a meal.   insulin glargine-yfgn 100 UNIT/ML injection Commonly known as: Semglee (yfgn) Inject 0.4 mLs (40 Units total) into the skin daily. Insulin pen   Insulin Pen Needle 31G X 5 MM Misc 1 Device by Does not apply route as directed.   metFORMIN 750 MG 24 hr tablet Commonly  known as: GLUCOPHAGE-XR TAKE 2 TABLETS (1500 MG) DAILY   OneTouch Delica Plus Lancing Misc by Does not apply route.   OneTouch Ultra test strip Generic drug: glucose blood Use as instructed to test blood sugar 2 times daily E11.9   onetouch ultrasoft lancets Use as instructed to test blood sugar 2 times daily E11.9   PRE-NATAL PO Take 1 tablet by mouth every evening.   PRENATAL + DHA PO         OBJECTIVE:   Vital Signs:BP 126/74 (BP Location: Left Arm, Patient Position: Sitting, Cuff Size: Large)   Pulse 84   Ht 5\' 5"  (1.651 m)   Wt 201 lb (91.2 kg)   SpO2 99%   BMI 33.45 kg/m   Wt Readings from Last 3 Encounters:  05/11/23 201 lb (91.2 kg)  12/10/22 206 lb (93.4 kg)  08/12/22 215 lb (97.5 kg)     Exam: General: Pt appears well and is in NAD  Lungs: Clear with good BS bilat   Heart: RRR   Extremities: No pretibial edema.   Neuro: MS is good with appropriate affect, pt is alert and Ox3         DM foot exam: 05/11/2023    The skin of the feet is intact without sores or ulcerations. The pedal pulses are 2+ on right and 2+ on left. The sensation is intact to a screening 5.07, 10 gram monofilament bilaterally    DATA REVIEWED:  Lab Results  Component Value Date   HGBA1C 10.7 (A) 05/11/2023   HGBA1C 7.0 (H) 06/10/2022   HGBA1C 5.5 03/27/2022    Latest Reference Range & Units 06/10/22 12:22 06/24/22 10:01  Sodium 135 - 145 mEq/L 138   Potassium 3.5 - 5.1 mEq/L 4.2   Chloride 96 - 112 mEq/L 97   CO2 19 - 32 mEq/L 33 (H)   Glucose 70 - 99 mg/dL 086 (H)   BUN 6 - 23 mg/dL 10   Creatinine 5.78 - 1.20 mg/dL 4.69   Calcium 8.4 - 62.9 mg/dL 9.5   Alkaline Phosphatase 39 - 117 U/L 77 66  Albumin 3.5 - 5.2 g/dL 4.5 4.3  AST 0 - 37 U/L 52 (H) 39 (H)  ALT 0 - 35 U/L 73 (H) 52 (H)  Total Protein 6.0 - 8.3 g/dL 7.7 7.6  Bilirubin, Direct 0.0 - 0.3 mg/dL  0.1  Total Bilirubin 0.2 - 1.2 mg/dL 0.7 0.7  GFR >52.84 mL/min 121.87    In office BG 350  Mg/DL    ASSESSMENT / PLAN / RECOMMENDATIONS:   1) Type 2 Diabetes Mellitus, Poorly controlled, Without complications - Most recent A1c of 10.7%. Goal A1c < 7.0 %.    -A1c has increased from 7.0%  to 10.7%, this is due to medication nonadherence and dietary indiscretions -It is actually to me what are the barriers to diabetes self-care, but she denies depression -I have recommended Mounjaro, she used to be on Ozempic in the past but we had to discontinue due to pregnancy -I have also recommended restarting insulin as below -Patient prefers to use fingersticks rather than CGM technology at this time  MEDICATIONS: -Continue metformin 750 mg XR to  2 tabs daily -Restart Semglee 26 units daily  -Start Mounjaro 2.5 mg once weekly for a month, then increase to 5 mg weekly    EDUCATION / INSTRUCTIONS: BG monitoring instructions: Patient is instructed to check her blood sugars 7 times a day, before each meal, 2 hours postprandial, and bedtime Call [facility] if: BG persistently < 70 I reviewed the Rule of 15 for the treatment of hypoglycemia in detail with the patient. Literature supplied.     2) Diabetic complications:  Eye: Does not have known diabetic retinopathy.  Neuro/ Feet: Does not have known diabetic peripheral neuropathy .  Renal: Patient does not have known baseline CKD.       F/U in 3 months    Signed electronically by: Lyndle Herrlich, MD  [facility]  [facility] [address] [place] [place], [place] [address] Phone: [phone] FAX: [phone]   CC: Sheliah Hatch, MD [address] [place] [address] Phone: [phone]  Fax: [phone]  Return to Endocrinology clinic as below: Future Appointments  Date Time Provider Department Center  06/11/2023 10:00 AM Sheliah Hatch, MD [id number]

## 2023-05-18 ENCOUNTER — Encounter: Payer: Self-pay | Admitting: Internal Medicine

## 2023-05-18 ENCOUNTER — Telehealth: Payer: Self-pay | Admitting: Pharmacy Technician

## 2023-05-18 NOTE — Telephone Encounter (Signed)
Pharmacy Patient Advocate Encounter   Received notification from Fax that prior authorization for [facility] is required/requested.   Insurance verification completed.   The patient is insured through [employer] .   Per test claim: PA required; PA submitted to above mentioned insurance via Fax Key/confirmation #/EOC [id number] Status is pending

## 2023-05-26 NOTE — Telephone Encounter (Signed)
Pharmacy Patient Advocate Encounter  Received notification from [employer] that Prior Authorization for Mounjaro 5MG /0.5ML Pen Injector has been APPROVED from 04-18-2023 to 05-17-2024   PA #/Case ID/Reference #: [id number]

## 2023-06-01 DIAGNOSIS — Z01419 Encounter for gynecological examination (general) (routine) without abnormal findings: Secondary | ICD-10-CM | POA: Diagnosis not present

## 2023-06-11 ENCOUNTER — Encounter: Payer: BC Managed Care – PPO | Admitting: Family Medicine

## 2023-07-05 ENCOUNTER — Encounter: Payer: BC Managed Care – PPO | Admitting: Family Medicine

## 2023-07-13 ENCOUNTER — Ambulatory Visit: Payer: BC Managed Care – PPO | Admitting: Internal Medicine

## 2023-07-18 ENCOUNTER — Other Ambulatory Visit: Payer: Self-pay | Admitting: Family Medicine

## 2023-07-18 DIAGNOSIS — F411 Generalized anxiety disorder: Secondary | ICD-10-CM

## 2023-08-17 NOTE — Progress Notes (Unsigned)
Name: Kimberly Diaz  Age/ Sex: 33 y.o., female   MRN/ DOB: [id number], [birth year]     PCP: Sheliah Hatch, MD   Reason for Endocrinology Evaluation: Type 2 Diabetes Mellitus  Initial Endocrine Consultative Visit: 09/06/2019    PATIENT IDENTIFIER: Kimberly Diaz is a 33 y.o. female with a past medical history of T2DM and PCOS. The patient has followed with Endocrinology clinic since 09/06/2019 for consultative assistance with management of her diabetes.  DIABETIC HISTORY:  Ms. Epling was diagnosed with DM in 2016, she has been on metformin since her diagnosis. She has required insulin during 3rd trimester in 2018. Her hemoglobin A1c has ranged from 5.8% in 2018, peaking at 9.0% in 2019.  On her initial visit to our clinic her A1c was 9.8% she was on metformin only, we started her on Glyburide and gradual titration of the dose.   She was switched to MDI regimen based on OB recommendations 07/2020 Ozempic started 01/2021 but was discontinued in preparation for pregnancy  Pt has a 60 yr old daughter  S/P C-section 04/13/2022- [place]  SUBJECTIVE:   During the last visit (05/11/2023): A1c 10.7%     Today (08/18/2023): [name] is here for a follow up on diabetes.  She has been checking  glucose occasionally.  The patient has not had hypoglycemic episodes since the last clinic visit.    Denies recent nausea or vomiting  Denies constipation or diarrhea   HOME DIABETES REGIMEN:  Metformin XR 750 mg, 2 tabs daily Mounjaro 5 mg weekly Semglee 26 units daily      METER DOWNLOAD SUMMARY:  1/29-2/06/2024  Average Number Tests/Day = 0.2 Overall Mean FS Glucose = 141   BG Ranges: Low = 123 High = 172    Hypoglycemic Events/30 Days: BG < 50 = 0 Episodes of symptomatic severe hypoglycemia = 0     DIABETIC COMPLICATIONS: Microvascular complications:    Denies: CKD, neuropathy  Last eye exam: Completed 2023   Macrovascular complications:    Denies: CAD, PVD,  CVA   HISTORY:  Past Medical History:  Past Medical History:  Diagnosis Date   Anxiety    Chronic back pain    muscle stiffness   Diabetes mellitus without complication (HCC)    metformin   Fatty liver    Hx of varicella    PCOS (polycystic ovarian syndrome)    possible    Past Surgical History:  Past Surgical History:  Procedure Laterality Date   CESAREAN SECTION N/A 11/06/2016   Procedure: CESAREAN SECTION;  Surgeon: Marlow Baars, MD;  Location: [facility];  Service: Obstetrics;  Laterality: N/A;   CESAREAN SECTION N/A 04/13/2022   Procedure: CESAREAN SECTION;  Surgeon: Marlow Baars, MD;  Location: [facility];  Service: Obstetrics;  Laterality: N/A;   EYE SURGERY     WISDOM TOOTH EXTRACTION  07/06/2010   Social History:  reports that she has never smoked. She has never used smokeless tobacco. She reports that she does not drink alcohol and does not use drugs. Family History:  Family History  Problem Relation Age of Onset   Hypertension Father    Diabetes Father    Cancer Maternal Grandfather    Heart disease Maternal Grandfather    Cancer Paternal Grandfather    Breast cancer Paternal Grandmother      HOME MEDICATIONS: Allergies as of 08/18/2023   No Known Allergies      Medication List        Accurate as  of August 18, 2023  7:57 AM. If you have any questions, ask your nurse or doctor.          citalopram 40 MG tablet Commonly known as: CELEXA TAKE 1 TABLET DAILY   FreeStyle Libre 3 Sensor Misc 1 Device by Does not apply route every 14 (fourteen) days.   [name] 24 Fe 1-20 MG-MCG(24) tablet Generic drug: Norethindrone Acetate-Ethinyl Estrad-FE Take 1 tablet by mouth daily.   insulin glargine-yfgn 100 UNIT/ML injection Commonly known as: Semglee (yfgn) Inject 0.26 mLs (26 Units total) into the skin daily. Insulin pen   Insulin Pen Needle 31G X 5 MM Misc 1 Device by Does not apply route daily in the afternoon.   metFORMIN 750 MG 24 hr  tablet Commonly known as: GLUCOPHAGE-XR Take 2 tablets (1,500 mg total) by mouth daily with breakfast.   OneTouch Delica Plus Lancing Misc by Does not apply route.   OneTouch Ultra test strip Generic drug: glucose blood Use as instructed to test blood sugar 2 times daily E11.9   onetouch ultrasoft lancets Use as instructed to test blood sugar 2 times daily E11.9   PRE-NATAL PO Take 1 tablet by mouth every evening.   PRENATAL + DHA PO   tirzepatide 2.5 MG/0.5ML Pen Commonly known as: MOUNJARO Inject 2.5 mg into the skin once a week.   tirzepatide 5 MG/0.5ML Pen Commonly known as: MOUNJARO Inject 5 mg into the skin once a week.         OBJECTIVE:   Vital Signs:BP 122/70 (BP Location: Left Arm, Patient Position: Sitting, Cuff Size: Normal)   Pulse 84   Ht 5\' 5"  (1.651 m)   Wt 195 lb (88.5 kg)   SpO2 98%   BMI 32.45 kg/m   Wt Readings from Last 3 Encounters:  08/18/23 195 lb (88.5 kg)  05/11/23 201 lb (91.2 kg)  12/10/22 206 lb (93.4 kg)     Exam: General: Pt appears well and is in NAD  Lungs: Clear with good BS bilat   Heart: RRR   Extremities: No pretibial edema.   Neuro: MS is good with appropriate affect, pt is alert and Ox3         DM foot exam: 05/11/2023    The skin of the feet is intact without sores or ulcerations. The pedal pulses are 2+ on right and 2+ on left. The sensation is intact to a screening 5.07, 10 gram monofilament bilaterally    DATA REVIEWED:  Lab Results  Component Value Date   HGBA1C 10.7 (A) 05/11/2023   HGBA1C 7.0 (H) 06/10/2022   HGBA1C 5.5 03/27/2022    Latest Reference Range & Units 12/16/22 15:11  Alkaline Phosphatase 39 - 117 U/L 76  Albumin 3.5 - 5.2 g/dL 4.4  AST 0 - 37 U/L 73 (H)  ALT 0 - 35 U/L 88 (H)  Total Protein 6.0 - 8.3 g/dL 8.0  Bilirubin, Direct 0.0 - 0.3 mg/dL 0.1  Total Bilirubin 0.2 - 1.2 mg/dL 0.6      ASSESSMENT / PLAN / RECOMMENDATIONS:   1) Type 2 Diabetes Mellitus, Optimally   controlled, Without complications - Most recent A1c of 5.6%. Goal A1c < 7.0 %.    -I have praised the patient on improving glycemic control -A1c down from 10.7% -I have recommended increasing Mounjaro -I initially also recommended discontinuing Semglee but she would like to stay on it so we opted to decrease the dose as below for now.  Patient advised to discontinue insulin should she  experience any hypoglycemic episodes -Patient prefers to use fingersticks rather than CGM technology at this time  MEDICATIONS: -Continue metformin 750 mg XR to  2 tabs daily -Decrease Semglee 14 units daily  -Increase Mounjaro 7.5 mg weekly  EDUCATION / INSTRUCTIONS: BG monitoring instructions: Patient is instructed to check her blood sugars 7 times a day, before each meal, 2 hours postprandial, and bedtime Call [facility] if: BG persistently < 70 I reviewed the Rule of 15 for the treatment of hypoglycemia in detail with the patient. Literature supplied.     2) Diabetic complications:  Eye: Does not have known diabetic retinopathy.  Neuro/ Feet: Does not have known diabetic peripheral neuropathy .  Renal: Patient does not have known baseline CKD.       F/U in 4 months    Signed electronically by: Lyndle Herrlich, MD  [facility]  [facility] [address] [place] [place], [place] [address] Phone: [phone] FAX: [phone]   CC: Sheliah Hatch, MD [address] [place] [address] Phone: [phone]  Fax: [phone]  Return to Endocrinology clinic as below: No future appointments.

## 2023-08-18 ENCOUNTER — Ambulatory Visit: Payer: BC Managed Care – PPO | Admitting: Internal Medicine

## 2023-08-18 ENCOUNTER — Encounter: Payer: Self-pay | Admitting: Internal Medicine

## 2023-08-18 VITALS — BP 122/70 | HR 84 | Ht 65.0 in | Wt 195.0 lb

## 2023-08-18 DIAGNOSIS — E119 Type 2 diabetes mellitus without complications: Secondary | ICD-10-CM

## 2023-08-18 DIAGNOSIS — R809 Proteinuria, unspecified: Secondary | ICD-10-CM

## 2023-08-18 DIAGNOSIS — Z794 Long term (current) use of insulin: Secondary | ICD-10-CM | POA: Diagnosis not present

## 2023-08-18 DIAGNOSIS — E1165 Type 2 diabetes mellitus with hyperglycemia: Secondary | ICD-10-CM | POA: Diagnosis not present

## 2023-08-18 DIAGNOSIS — E1129 Type 2 diabetes mellitus with other diabetic kidney complication: Secondary | ICD-10-CM

## 2023-08-18 LAB — MICROALBUMIN / CREATININE URINE RATIO
Creatinine, Urine: 213 mg/dL (ref 20–275)
Microalb Creat Ratio: 44 mg/g{creat} — ABNORMAL HIGH (ref <30)
Microalb, Ur: 9.3 mg/dL

## 2023-08-18 LAB — POCT GLYCOSYLATED HEMOGLOBIN (HGB A1C): Hemoglobin A1C: 5.6 % (ref 4.0–5.6)

## 2023-08-18 MED ORDER — TIRZEPATIDE 7.5 MG/0.5ML ~~LOC~~ SOAJ: 7.5000 mg | SUBCUTANEOUS | 3 refills | Status: DC

## 2023-08-18 MED ORDER — METFORMIN HCL ER 750 MG PO TB24: 1500.0000 mg | ORAL_TABLET | Freq: Every day | ORAL | 3 refills | Status: DC

## 2023-08-18 MED ORDER — INSULIN GLARGINE-YFGN 100 UNIT/ML ~~LOC~~ SOLN: 14.0000 [IU] | Freq: Every day | SUBCUTANEOUS | 3 refills | Status: DC

## 2023-08-18 MED ORDER — INSULIN PEN NEEDLE 31G X 5 MM MISC: 1.0000 | Freq: Every day | 3 refills | Status: DC

## 2023-08-18 NOTE — Patient Instructions (Signed)
-   Continue Metformin 750 mg, 2 tablets a day - Increase  Mounjaro 7.5 mg once weekly  - Decrease  Semglee 14 units daily   HOW TO TREAT LOW BLOOD SUGARS (Blood sugar LESS THAN 60 MG/DL) Please follow the RULE OF 15 for the treatment of hypoglycemia treatment (when your (blood sugars are less than 60 mg/dL)   STEP 1: Take 15 grams of carbohydrates when your blood sugar is low, which includes:  3-4 GLUCOSE TABS  OR 3-4 OZ OF JUICE OR REGULAR SODA OR ONE TUBE OF GLUCOSE GEL    STEP 2: RECHECK blood sugar in 15 MINUTES STEP 3: If your blood sugar is still low at the 15 minute recheck --> then, go back to STEP 1 and treat AGAIN with another 15 grams of carbohydrates.

## 2023-08-19 ENCOUNTER — Encounter: Payer: Self-pay | Admitting: Internal Medicine

## 2023-08-19 DIAGNOSIS — R809 Proteinuria, unspecified: Secondary | ICD-10-CM | POA: Insufficient documentation

## 2023-08-19 DIAGNOSIS — E1129 Type 2 diabetes mellitus with other diabetic kidney complication: Secondary | ICD-10-CM | POA: Insufficient documentation

## 2023-08-24 ENCOUNTER — Encounter: Payer: Self-pay | Admitting: Internal Medicine

## 2023-08-27 MED ORDER — SEMAGLUTIDE(0.25 OR 0.5MG/DOS) 2 MG/3ML ~~LOC~~ SOPN: 0.5000 mg | PEN_INJECTOR | SUBCUTANEOUS | 11 refills | Status: DC

## 2023-09-29 ENCOUNTER — Telehealth: Payer: Self-pay | Admitting: Pharmacy Technician

## 2023-09-29 ENCOUNTER — Other Ambulatory Visit (HOSPITAL_COMMUNITY): Payer: Self-pay

## 2023-09-29 NOTE — Telephone Encounter (Signed)
 Pharmacy Patient Advocate Encounter  Received notification from [employer] that Prior Authorization for Ozempic (0.25 or 0.5 MG/DOSE) 2MG /3ML pen-injectors has been APPROVED from 09/29/2023 to 09/28/2024. Ran test claim, Copay is $187.35. This test claim was processed through [facility]- copay amounts may vary at other pharmacies due to pharmacy/plan contracts, or as the patient moves through the different stages of their insurance plan.   PA #/Case ID/Reference #: [id number]

## 2023-10-13 DIAGNOSIS — M25511 Pain in right shoulder: Secondary | ICD-10-CM | POA: Diagnosis not present

## 2023-10-13 DIAGNOSIS — M9901 Segmental and somatic dysfunction of cervical region: Secondary | ICD-10-CM | POA: Diagnosis not present

## 2023-10-13 DIAGNOSIS — M5411 Radiculopathy, occipito-atlanto-axial region: Secondary | ICD-10-CM | POA: Diagnosis not present

## 2023-11-16 DIAGNOSIS — M79662 Pain in left lower leg: Secondary | ICD-10-CM | POA: Diagnosis not present

## 2023-11-16 DIAGNOSIS — I83893 Varicose veins of bilateral lower extremities with other complications: Secondary | ICD-10-CM | POA: Diagnosis not present

## 2023-11-16 DIAGNOSIS — M7989 Other specified soft tissue disorders: Secondary | ICD-10-CM | POA: Diagnosis not present

## 2023-11-16 DIAGNOSIS — M79661 Pain in right lower leg: Secondary | ICD-10-CM | POA: Diagnosis not present

## 2023-11-16 DIAGNOSIS — I87393 Chronic venous hypertension (idiopathic) with other complications of bilateral lower extremity: Secondary | ICD-10-CM | POA: Diagnosis not present

## 2023-11-16 DIAGNOSIS — M79604 Pain in right leg: Secondary | ICD-10-CM | POA: Diagnosis not present

## 2023-11-30 ENCOUNTER — Encounter: Payer: Self-pay | Admitting: Internal Medicine

## 2023-12-01 ENCOUNTER — Other Ambulatory Visit: Payer: Self-pay

## 2023-12-01 MED ORDER — TIRZEPATIDE 7.5 MG/0.5ML ~~LOC~~ SOAJ: 7.5000 mg | SUBCUTANEOUS | 0 refills | Status: DC

## 2023-12-16 ENCOUNTER — Ambulatory Visit: Payer: BC Managed Care – PPO | Admitting: Internal Medicine

## 2023-12-16 ENCOUNTER — Encounter: Payer: Self-pay | Admitting: Internal Medicine

## 2023-12-16 VITALS — BP 110/78 | HR 71 | Ht 65.0 in | Wt 181.0 lb

## 2023-12-16 DIAGNOSIS — R809 Proteinuria, unspecified: Secondary | ICD-10-CM | POA: Diagnosis not present

## 2023-12-16 DIAGNOSIS — E1129 Type 2 diabetes mellitus with other diabetic kidney complication: Secondary | ICD-10-CM | POA: Diagnosis not present

## 2023-12-16 DIAGNOSIS — Z794 Long term (current) use of insulin: Secondary | ICD-10-CM | POA: Diagnosis not present

## 2023-12-16 LAB — POCT GLYCOSYLATED HEMOGLOBIN (HGB A1C): Hemoglobin A1C: 5.8 % — AB (ref 4.0–5.6)

## 2023-12-16 LAB — POCT GLUCOSE (DEVICE FOR HOME USE): POC Glucose: 184 mg/dL — AB (ref 70–99)

## 2023-12-16 MED ORDER — TIRZEPATIDE 7.5 MG/0.5ML ~~LOC~~ SOAJ: 7.5000 mg | SUBCUTANEOUS | 11 refills | Status: DC

## 2023-12-16 NOTE — Progress Notes (Signed)
 Name: Kimberly Diaz  Age/ Sex: 33 y.o., female   MRN/ DOB: [id number], 07/09/1990     PCP: Jess Morita, MD   Reason for Endocrinology Evaluation: Type 2 Diabetes Mellitus  Initial Endocrine Consultative Visit: 09/06/2019    PATIENT IDENTIFIER: Kimberly Diaz is a 33 y.o. female with a past medical history of T2DM and PCOS. The patient has followed with Endocrinology clinic since 09/06/2019 for consultative assistance with management of her diabetes.  DIABETIC HISTORY:  [name] was diagnosed with DM in 2016, she has been on metformin  since her diagnosis. She has required insulin  during 3rd trimester in 2018. Her hemoglobin A1c has ranged from 5.8% in 2018, peaking at 9.0% in 2019.  On her initial visit to our clinic her A1c was 9.8% she was on metformin  only, we started her on Glyburide  and gradual titration of the dose.   She was switched to MDI regimen based on OB recommendations 07/2020 Ozempic  started 01/2021 but was discontinued in preparation for pregnancy  Pt has a 1 yr old daughter  S/P C-section 04/13/2022- Kimberly Diaz  Started Mounjaro  05/2023 with an A1c of 10.7%, she was off basal insulin  by 08/2023   SUBJECTIVE:   During the last visit (08/18/2023): A1c 5.6%    Today (12/16/2023): [name] is here for a follow up on diabetes.  She has been checking  glucose occasionally.  The patient has not had hypoglycemic episodes since the last clinic visit.   Patient has been noted with weight loss She does have transient nausea within the first 24 of mounjaro  injection  Denies constipation or diarrhea   HOME DIABETES REGIMEN:  Metformin  XR 750 mg, 2 tabs daily Mounjaro  7.5 mg weekly     METER DOWNLOAD SUMMARY:  n/a     DIABETIC COMPLICATIONS: Microvascular complications:    Denies: CKD, neuropathy  Last eye exam: Completed 2023   Macrovascular complications:    Denies: CAD, PVD, CVA   HISTORY:  Past Medical History:  Past Medical History:   Diagnosis Date   Anxiety    Chronic back pain    muscle stiffness   Diabetes mellitus without complication (HCC)    metformin    Fatty liver    Hx of varicella    PCOS (polycystic ovarian syndrome)    possible    Past Surgical History:  Past Surgical History:  Procedure Laterality Date   CESAREAN SECTION N/A 11/06/2016   Procedure: CESAREAN SECTION;  Surgeon: Luan Rumpf, MD;  [facility]: [facility];  Service: Obstetrics;  Laterality: N/A;   CESAREAN SECTION N/A 04/13/2022   Procedure: CESAREAN SECTION;  Surgeon: Luan Rumpf, MD;  Location: [facility];  Service: Obstetrics;  Laterality: N/A;   EYE SURGERY     WISDOM TOOTH EXTRACTION  07/06/2010   Social History:  reports that she has never smoked. She has never used smokeless tobacco. She reports that she does not drink alcohol and does not use drugs. Family History:  Family History  Problem Relation Age of Onset   Hypertension Father    Diabetes Father    Cancer Maternal Grandfather    Heart disease Maternal Grandfather    Cancer Paternal Grandfather    Breast cancer Paternal Grandmother      HOME MEDICATIONS: Allergies as of 12/16/2023   No Known Allergies      Medication List        Accurate as of December 16, 2023  9:01 AM. If you have any questions, ask your nurse or  doctor.          STOP taking these medications    insulin  glargine-yfgn 100 UNIT/ML injection Commonly known as: Semglee  (yfgn) Stopped by: Camilla Cedar Rayhan Groleau   Insulin  Pen Needle 31G X 5 MM Misc Stopped by: Brailyn Killion J Iriel Nason       TAKE these medications    citalopram  40 MG tablet Commonly known as: CELEXA  TAKE 1 TABLET DAILY   [name] 24 Fe 1-20 MG-MCG(24) tablet Generic drug: Norethindrone Acetate-Ethinyl Estrad-FE Take 1 tablet by mouth daily.   metFORMIN  750 MG 24 hr tablet Commonly known as: GLUCOPHAGE -XR Take 2 tablets (1,500 mg total) by mouth daily with breakfast.   OneTouch Delica Plus Lancing Misc by Does  not apply route.   OneTouch Ultra test strip Generic drug: glucose blood Use as instructed to test blood sugar 2 times daily E11.9   onetouch ultrasoft lancets Use as instructed to test blood sugar 2 times daily E11.9   PRE-NATAL PO Take 1 tablet by mouth every evening.   PRENATAL + DHA PO   Semaglutide (0.25 or 0.5MG /DOS) 2 MG/3ML Sopn Inject 0.5 mg into the skin once a week.   tirzepatide  7.5 MG/0.5ML Pen Commonly known as: MOUNJARO  Inject 7.5 mg into the skin once a week.         OBJECTIVE:   Vital Signs:BP 110/78 (BP Location: Left Arm, Patient Position: Sitting, Cuff Size: Normal)   Pulse 71   Ht 5' 5 (1.651 m)   Wt 181 lb (82.1 kg)   SpO2 99%   BMI 30.12 kg/m   Wt Readings from Last 3 Encounters:  12/16/23 181 lb (82.1 kg)  08/18/23 195 lb (88.5 kg)  05/11/23 201 lb (91.2 kg)     Exam: General: Pt appears well and is in NAD  Lungs: Clear with good BS bilat   Heart: RRR   Extremities: No pretibial edema.   Neuro: MS is good with appropriate affect, pt is alert and Ox3         DM foot exam: 05/11/2023    The skin of the feet is intact without sores or ulcerations. The pedal pulses are 2+ on right and 2+ on left. The sensation is intact to a screening 5.07, 10 gram monofilament bilaterally    DATA REVIEWED:  Lab Results  Component Value Date   HGBA1C 5.8 (A) 12/16/2023   HGBA1C 5.6 08/18/2023   HGBA1C 10.7 (A) 05/11/2023    Latest Reference Range & Units 12/16/23 09:34  Sodium 135 - 146 mmol/L 139  Potassium 3.5 - 5.3 mmol/L 4.7  Chloride 98 - 110 mmol/L 103  CO2 20 - 32 mmol/L 27  Glucose 65 - 99 mg/dL 161 (H)  BUN 7 - 25 mg/dL 6 (L)  Creatinine 0.96 - 0.97 mg/dL 0.45  Calcium 8.6 - 40.9 mg/dL 9.3  BUN/Creatinine Ratio 6 - 22 (calc) 12  eGFR > OR = 60 mL/min/1.55m2 126  Total CHOL/HDL Ratio <5.0 (calc) 3.6  Cholesterol <200 mg/dL 811  HDL Cholesterol > OR = 50 mg/dL 41 (L)  LDL Cholesterol (Calc) mg/dL (calc) 84  MICROALB/CREAT  RATIO <30 mg/g creat 34 (H)  Non-HDL Cholesterol (Calc) <130 mg/dL (calc) 914  Triglycerides <150 mg/dL 782    Latest Reference Range & Units 12/16/23 09:34  TSH mIU/L 1.38    Latest Reference Range & Units 12/16/23 09:34  Microalb, Ur mg/dL 4.7  MICROALB/CREAT RATIO <30 mg/g creat 34 (H)  Creatinine, Urine 20 - 275 mg/dL 956  (H): Data is abnormally  high  ASSESSMENT / PLAN / RECOMMENDATIONS:   1) Type 2 Diabetes Mellitus, Optimally  controlled, With Microalbuminuria complications - Most recent A1c of 5.8%. Goal A1c < 7.0 %.    -A1c remains optimal -Patient prefers to use fingersticks rather than CGM technology at this time - Since she is continuing to lose weight, we have opted to remain on current dose of metformin  - She has been off basal insulin  since February, 2025 - GFR, TSH and lipid within normal range - She was advised to scan Mounjaro  coupon to see if that would help reduce the price, she will receive this through a local pharmacy.  I have also encouraged the patient to check for patient assistance options for Mounjaro   MEDICATIONS: -Continue metformin  750 mg XR to  2 tabs daily - Continue Mounjaro  7.5 mg weekly  EDUCATION / INSTRUCTIONS: BG monitoring instructions: Patient is instructed to check her blood sugars 7 times a day, before each meal, 2 hours postprandial, and bedtime Call [facility] if: BG persistently < 70 I reviewed the Rule of 15 for the treatment of hypoglycemia in detail with the patient. Literature supplied.     2) Diabetic complications:  Eye: Does not have known diabetic retinopathy.  Neuro/ Feet: Does not have known diabetic peripheral neuropathy .  Renal: Patient does not have known baseline CKD.     3) Microalbuminuria:  - Trending down, no change    F/U in 6 months    Signed electronically by: Natale Bail, MD  [facility] Endocrinology  [facility] [address] [place] [place], [place] [address] Phone: [phone] FAX: [phone]   CC: Jess Morita, MD [address] [place] [address] Phone: [phone]  Fax: [phone]  Return to Endocrinology clinic as below: No future appointments.

## 2023-12-16 NOTE — Patient Instructions (Signed)
-   Continue Metformin  750 mg, 2 tablets a day - Continue Mounjaro  7.5 mg once weekly      HOW TO TREAT LOW BLOOD SUGARS (Blood sugar LESS THAN 60 MG/DL) Please follow the RULE OF 15 for the treatment of hypoglycemia treatment (when your (blood sugars are less than 60 mg/dL)   STEP 1: Take 15 grams of carbohydrates when your blood sugar is low, which includes:  3-4 GLUCOSE TABS  OR 3-4 OZ OF JUICE OR REGULAR SODA OR ONE TUBE OF GLUCOSE GEL    STEP 2: RECHECK blood sugar in 15 MINUTES STEP 3: If your blood sugar is still low at the 15 minute recheck --> then, go back to STEP 1 and treat AGAIN with another 15 grams of carbohydrates.

## 2023-12-17 ENCOUNTER — Ambulatory Visit: Payer: Self-pay | Admitting: Internal Medicine

## 2024-02-17 DIAGNOSIS — D2261 Melanocytic nevi of right upper limb, including shoulder: Secondary | ICD-10-CM | POA: Diagnosis not present

## 2024-02-17 DIAGNOSIS — D2362 Other benign neoplasm of skin of left upper limb, including shoulder: Secondary | ICD-10-CM | POA: Diagnosis not present

## 2024-02-17 DIAGNOSIS — D235 Other benign neoplasm of skin of trunk: Secondary | ICD-10-CM | POA: Diagnosis not present

## 2024-02-17 DIAGNOSIS — D225 Melanocytic nevi of trunk: Secondary | ICD-10-CM | POA: Diagnosis not present

## 2024-03-22 DIAGNOSIS — M5411 Radiculopathy, occipito-atlanto-axial region: Secondary | ICD-10-CM | POA: Diagnosis not present

## 2024-03-22 DIAGNOSIS — M9901 Segmental and somatic dysfunction of cervical region: Secondary | ICD-10-CM | POA: Diagnosis not present

## 2024-03-22 DIAGNOSIS — M25511 Pain in right shoulder: Secondary | ICD-10-CM | POA: Diagnosis not present

## 2024-05-05 ENCOUNTER — Other Ambulatory Visit (HOSPITAL_COMMUNITY): Payer: Self-pay

## 2024-05-05 ENCOUNTER — Telehealth: Payer: Self-pay | Admitting: Pharmacy Technician

## 2024-05-05 NOTE — Telephone Encounter (Signed)
 Pharmacy Patient Advocate Encounter  Received notification from [employer] that Prior Authorization for Mounjaro  7.5MG /0.5ML auto-injectors  has been APPROVED from 05/05/24 to 05/05/25. Ran test claim, Copay is $25.00. This test claim was processed through [facility]- copay amounts may vary at other pharmacies due to pharmacy/plan contracts, or as the patient moves through the different stages of their insurance plan.   PA #/Case ID/Reference #: [id number]

## 2024-05-05 NOTE — Telephone Encounter (Signed)
 Pharmacy Patient Advocate Encounter   Received notification from CoverMyMeds that prior authorization for Mounjaro  7.5MG /0.5ML auto-injectors  is required/requested.   Insurance verification completed.   The patient is insured through [facility].   Per test claim: PA required; PA submitted to above mentioned insurance via Latent Key/confirmation #/EOC [id number] Status is pending

## 2024-05-15 LAB — LIPID PANEL
Cholesterol: 149 mg/dL (ref <200)
HDL: 41 mg/dL — ABNORMAL LOW (ref >=50)
LDL Cholesterol (Calc): 84 mg/dL
Non-HDL Cholesterol (Calc): 108 mg/dL (ref <130)
Total CHOL/HDL Ratio: 3.6 (calc) (ref <5.0)
Triglycerides: 144 mg/dL (ref <150)

## 2024-05-15 LAB — MICROALBUMIN / CREATININE URINE RATIO
Creatinine, Urine: 137 mg/dL (ref 20–275)
Microalb Creat Ratio: 34 mg/g{creat} — ABNORMAL HIGH (ref <30)
Microalb, Ur: 4.7 mg/dL

## 2024-05-15 LAB — BASIC METABOLIC PANEL WITH GFR
BUN/Creatinine Ratio: 12 (calc) (ref 6–22)
BUN: 6 mg/dL — ABNORMAL LOW (ref 7–25)
CO2: 27 mmol/L (ref 20–32)
Calcium: 9.3 mg/dL (ref 8.6–10.2)
Chloride: 103 mmol/L (ref 98–110)
Creat: 0.51 mg/dL (ref 0.50–0.97)
Glucose, Bld: 144 mg/dL — ABNORMAL HIGH (ref 65–139)
Potassium: 4.7 mmol/L (ref 3.5–5.3)
Sodium: 139 mmol/L (ref 135–146)
eGFR: 126 mL/min/1.73m2 (ref >=60)

## 2024-05-15 LAB — TSH: TSH: 1.38 m[IU]/L

## 2024-07-04 ENCOUNTER — Encounter: Payer: Self-pay | Admitting: Internal Medicine

## 2024-07-04 ENCOUNTER — Ambulatory Visit (INDEPENDENT_AMBULATORY_CARE_PROVIDER_SITE_OTHER): Admitting: Internal Medicine

## 2024-07-04 VITALS — BP 124/84 | Ht 65.0 in | Wt 186.0 lb

## 2024-07-04 DIAGNOSIS — Z794 Long term (current) use of insulin: Secondary | ICD-10-CM

## 2024-07-04 DIAGNOSIS — R809 Proteinuria, unspecified: Secondary | ICD-10-CM | POA: Diagnosis not present

## 2024-07-04 DIAGNOSIS — E1129 Type 2 diabetes mellitus with other diabetic kidney complication: Secondary | ICD-10-CM

## 2024-07-04 LAB — POCT GLYCOSYLATED HEMOGLOBIN (HGB A1C): Hemoglobin A1C: 6.7 % — AB (ref 4.0–5.6)

## 2024-07-04 MED ORDER — METFORMIN HCL ER 750 MG PO TB24: 1500.0000 mg | ORAL_TABLET | Freq: Every day | ORAL | 3 refills | Status: AC

## 2024-07-04 MED ORDER — TIRZEPATIDE 7.5 MG/0.5ML ~~LOC~~ SOAJ: 7.5000 mg | SUBCUTANEOUS | 3 refills | Status: DC

## 2024-07-04 NOTE — Progress Notes (Signed)
 "      Name: Kimberly Diaz  Age/ Sex: 33 y.o., female   MRN/ DOB: [id number], 24-May-1991     PCP: Mahlon Comer BRAVO, MD   Reason for Endocrinology Evaluation: Type 2 Diabetes Mellitus  Initial Endocrine Consultative Visit: 09/06/2019    PATIENT IDENTIFIER: Kimberly Diaz is a 33 y.o. female with a past medical history of T2DM and PCOS. The patient has followed with Endocrinology clinic since 09/06/2019 for consultative assistance with management of her diabetes.  DIABETIC HISTORY:  Kimberly Diaz was diagnosed with DM in 2016, she has been on metformin  since her diagnosis. She has required insulin  during 3rd trimester in 2018. Her hemoglobin A1c has ranged from 5.8% in 2018, peaking at 9.0% in 2019.  On her initial visit to our clinic her A1c was 9.8% she was on metformin  only, we started her on Glyburide  and gradual titration of the dose.   She was switched to MDI regimen based on OB recommendations 07/2020 Ozempic  started 01/2021 but was discontinued in preparation for pregnancy  Kimberly Diaz has a 73 yr old daughter  S/P C-section 04/13/2022- [place]  Started Mounjaro  05/2023 with an A1c of 10.7%, she was off basal insulin  by 08/2023   SUBJECTIVE:   During the last visit (12/16/2023): A1c 5.8%    Today (07/04/2024): Kimberly Diaz is here for a follow up on diabetes.  She has been checking  glucose 0 times daily   She was having occasional nausea a couple days after Mounjaro  injection, but she found essential oils that has helped with this  No constipation or diarrhea  Due to insurance issues, she was having  to rationalize  Mounjaro  intake which has resulted in weight gain  She is interested in conception, she is not on COC's at this time      HOME DIABETES REGIMEN:  Metformin  XR 750 mg, 2 tabs daily Mounjaro  7.5 mg weekly     METER DOWNLOAD SUMMARY:  n/a     DIABETIC COMPLICATIONS: Microvascular complications:    Denies: CKD, neuropathy  Last eye exam: Completed 04/2024    Macrovascular complications:    Denies: CAD, PVD, CVA   HISTORY:  Past Medical History:  Past Medical History:  Diagnosis Date   Anxiety    Chronic back pain    muscle stiffness   Diabetes mellitus without complication (HCC)    metformin    Fatty liver    Hx of varicella    PCOS (polycystic ovarian syndrome)    possible    Past Surgical History:  Past Surgical History:  Procedure Laterality Date   CESAREAN SECTION N/A 11/06/2016   Procedure: CESAREAN SECTION;  Surgeon: Jolene Gaskins, MD;  Location: [facility];  Service: Obstetrics;  Laterality: N/A;   CESAREAN SECTION N/A 04/13/2022   Procedure: CESAREAN SECTION;  Surgeon: Gaskins Jolene, MD;  Location: [facility];  Service: Obstetrics;  Laterality: N/A;   EYE SURGERY     WISDOM TOOTH EXTRACTION  07/06/2010   Social History:  reports that she has never smoked. She has never used smokeless tobacco. She reports that she does not drink alcohol and does not use drugs. Family History:  Family History  Problem Relation Age of Onset   Hypertension Father    Diabetes Father    Cancer Maternal Grandfather    Heart disease Maternal Grandfather    Cancer Paternal Grandfather    Breast cancer Paternal Grandmother      HOME MEDICATIONS: Allergies as of 07/04/2024   No Known  Allergies      Medication List        Accurate as of July 04, 2024  8:57 AM. If you have any questions, ask your nurse or doctor.          citalopram  40 MG tablet Commonly known as: CELEXA  TAKE 1 TABLET DAILY   [name] 24 Fe 1-20 MG-MCG(24) tablet Generic drug: Norethindrone Acetate-Ethinyl Estrad-FE Take 1 tablet by mouth daily.   metFORMIN  750 MG 24 hr tablet Commonly known as: GLUCOPHAGE -XR Take 2 tablets (1,500 mg total) by mouth daily with breakfast.   OneTouch Delica Plus Lancing Misc by Does not apply route.   OneTouch Ultra test strip Generic drug: glucose blood Use as instructed to test blood sugar 2 times daily  E11.9   onetouch ultrasoft lancets Use as instructed to test blood sugar 2 times daily E11.9   PRE-NATAL PO Take 1 tablet by mouth every evening.   PRENATAL + DHA PO   tirzepatide  7.5 MG/0.5ML Pen Commonly known as: MOUNJARO  Inject 7.5 mg into the skin once a week.         OBJECTIVE:   Vital Signs:BP 124/84   Ht 5' 5 (1.651 m)   Wt 186 lb (84.4 kg)   BMI 30.95 kg/m   Wt Readings from Last 3 Encounters:  07/04/24 186 lb (84.4 kg)  12/16/23 181 lb (82.1 kg)  08/18/23 195 lb (88.5 kg)     Exam: General: Kimberly Diaz appears well and is in NAD  Lungs: Clear with good BS bilat   Heart: RRR   Extremities: No pretibial edema.   Neuro: MS is good with appropriate affect, Kimberly Diaz is alert and Ox3         DM foot exam: 07/04/2024    The skin of the feet is intact without sores or ulcerations. The pedal pulses are 2+ on right and 2+ on left. The sensation is intact to a screening 5.07, 10 gram monofilament bilaterally    DATA REVIEWED:  Lab Results  Component Value Date   HGBA1C 5.8 (A) 12/16/2023   HGBA1C 5.6 08/18/2023   HGBA1C 10.7 (A) 05/11/2023    Latest Reference Range & Units 12/16/23 09:34  Sodium 135 - 146 mmol/L 139  Potassium 3.5 - 5.3 mmol/L 4.7  Chloride 98 - 110 mmol/L 103  CO2 20 - 32 mmol/L 27  Glucose 65 - 99 mg/dL 855 (H)  BUN 7 - 25 mg/dL 6 (L)  Creatinine 9.49 - 0.97 mg/dL 9.48  Calcium 8.6 - 89.7 mg/dL 9.3  BUN/Creatinine Ratio 6 - 22 (calc) 12  eGFR > OR = 60 mL/min/1.64m2 126  Total CHOL/HDL Ratio <5.0 (calc) 3.6  Cholesterol <200 mg/dL 850  HDL Cholesterol > OR = 50 mg/dL 41 (L)  LDL Cholesterol (Calc) mg/dL (calc) 84  MICROALB/CREAT RATIO <30 mg/g creat 34 (H)  Non-HDL Cholesterol (Calc) <130 mg/dL (calc) 891  Triglycerides <150 mg/dL 855    Latest Reference Range & Units 12/16/23 09:34  TSH mIU/L 1.38    Latest Reference Range & Units 12/16/23 09:34  Microalb, Ur mg/dL 4.7  MICROALB/CREAT RATIO <30 mg/g creat 34 (H)  Creatinine,  Urine 20 - 275 mg/dL 862    ASSESSMENT / PLAN / RECOMMENDATIONS:   1) Type 2 Diabetes Mellitus, Optimally  controlled, With Microalbuminuria complications - Most recent A1c of 6.7%. Goal A1c < 7.0 %.    -A1c remains optimal -Patient had preferred to use fingersticks rather than CGM technology in the past, recently she has not been  checking glucose, I did encourage the patient to check glucose if not daily at least twice a week - She is interested in conception, she is not using any contraception at this time, I did encourage the patient to consider birth control while on Mounjaro .  Patient may try to conceive 3 months after discontinuation of Mounjaro , but may remain on metformin    MEDICATIONS: - Continue metformin  750 mg XR to  2 tabs daily - Continue Mounjaro  7.5 mg weekly  EDUCATION / INSTRUCTIONS: BG monitoring instructions: Patient is instructed to check her blood sugars 7 times a day, before each meal, 2 hours postprandial, and bedtime Call [facility] if: BG persistently < 70 I reviewed the Rule of 15 for the treatment of hypoglycemia in detail with the patient. Literature supplied.     2) Diabetic complications:  Eye: Does not have known diabetic retinopathy.  Neuro/ Feet: Does not have known diabetic peripheral neuropathy .  Renal: Patient does not have known baseline CKD.     3) Microalbuminuria:  - Trending down, no change    F/U in 6 months    Signed electronically by: Stefano Redgie Butts, MD  [facility] Endocrinology  [facility] [address] [place] [place], [place] [address] Phone: [phone] FAX: [phone]   CC: Mahlon Comer BRAVO, MD [address] [place] [address] Phone: [phone]  Fax: [phone]  Return to Endocrinology clinic as below: No future appointments.     "

## 2024-07-10 ENCOUNTER — Encounter: Payer: Self-pay | Admitting: Family Medicine

## 2024-07-10 DIAGNOSIS — F411 Generalized anxiety disorder: Secondary | ICD-10-CM

## 2024-07-11 MED ORDER — CITALOPRAM HYDROBROMIDE 40 MG PO TABS: 40.0000 mg | ORAL_TABLET | Freq: Every day | ORAL | 3 refills | Status: DC

## 2024-07-11 NOTE — Telephone Encounter (Signed)
 Patient has had to change pharmacies and is wondering if you would be willing to send in her Celexa  to [facility] in [place]?   I have pended this medication below and changed her pharmacy as well.

## 2024-07-18 ENCOUNTER — Encounter: Payer: Self-pay | Admitting: Internal Medicine

## 2024-07-26 ENCOUNTER — Ambulatory Visit: Admitting: Internal Medicine

## 2024-07-26 ENCOUNTER — Other Ambulatory Visit: Payer: Self-pay | Admitting: Internal Medicine

## 2024-07-26 VITALS — BP 128/82 | Ht 65.0 in | Wt 183.0 lb

## 2024-07-26 DIAGNOSIS — Z3A01 Less than 8 weeks gestation of pregnancy: Secondary | ICD-10-CM

## 2024-07-26 DIAGNOSIS — E1129 Type 2 diabetes mellitus with other diabetic kidney complication: Secondary | ICD-10-CM

## 2024-07-26 DIAGNOSIS — Z7984 Long term (current) use of oral hypoglycemic drugs: Secondary | ICD-10-CM

## 2024-07-26 DIAGNOSIS — R809 Proteinuria, unspecified: Secondary | ICD-10-CM

## 2024-07-26 DIAGNOSIS — Z794 Long term (current) use of insulin: Secondary | ICD-10-CM

## 2024-07-26 DIAGNOSIS — O24111 Pre-existing diabetes mellitus, type 2, in pregnancy, first trimester: Secondary | ICD-10-CM

## 2024-07-26 MED ORDER — FREESTYLE LIBRE 3 PLUS SENSOR MISC: 1.0000 | 3 refills | Status: AC

## 2024-07-26 MED ORDER — INSULIN PEN NEEDLE 32G X 4 MM MISC: 1.0000 | Freq: Four times a day (QID) | 3 refills | Status: AC

## 2024-07-26 MED ORDER — NOVOLOG FLEXPEN 100 UNIT/ML ~~LOC~~ SOPN: PEN_INJECTOR | SUBCUTANEOUS | 11 refills | Status: DC

## 2024-07-26 MED ORDER — ACCU-CHEK GUIDE TEST VI STRP: 1.0000 | ORAL_STRIP | Freq: Every day | 12 refills | Status: DC

## 2024-07-26 MED ORDER — ACCU-CHEK GUIDE W/DEVICE KIT: 1.0000 | PACK | Freq: Every day | 0 refills | Status: AC

## 2024-07-26 MED ORDER — LANTUS SOLOSTAR 100 UNIT/ML ~~LOC~~ SOPN: 20.0000 [IU] | PEN_INJECTOR | Freq: Every day | SUBCUTANEOUS | 11 refills | Status: DC

## 2024-07-26 NOTE — Patient Instructions (Addendum)
" °-   Continue Metformin  750 mg, 2 tablets a day - Start Lantus  20 units daily  - Take Novolog /Humalog  4 units before each meal  -Novolog /Humalog   correctional insulin : Use the scale below to help guide you before each meal    Blood sugar before meal Number of units to inject  Less than 135 0 unit  136 - 170 1 units  171 - 205 2 units  206 - 240 3 units  241 - 275 4 units  276 - 310 5 units    Pregnancy Goals : TIMING  BLOOD SUGAR GOALS  FASTING (before breakfast)  60 - 95  BEFORE MEALS LESS THAN 100  2 hours AFTER MEAL LESS THAN 120       HOW TO TREAT LOW BLOOD SUGARS (Blood sugar LESS THAN 60 MG/DL) Please follow the RULE OF 15 for the treatment of hypoglycemia treatment (when your (blood sugars are less than 60 mg/dL)   STEP 1: Take 15 grams of carbohydrates when your blood sugar is low, which includes:  3-4 GLUCOSE TABS  OR 3-4 OZ OF JUICE OR REGULAR SODA OR ONE TUBE OF GLUCOSE GEL    STEP 2: RECHECK blood sugar in 15 MINUTES STEP 3: If your blood sugar is still low at the 15 minute recheck --> then, go back to STEP 1 and treat AGAIN with another 15 grams of carbohydrates.     "

## 2024-07-26 NOTE — Progress Notes (Unsigned)
 "      Name: Kimberly Diaz  Age/ Sex: 34 y.o., female   MRN/ DOB: [id number], September 12, 1990     PCP: Kimberly Comer BRAVO, MD   Reason for Endocrinology Evaluation: Type 2 Diabetes Mellitus  Initial Endocrine Consultative Visit: 09/06/2019    PATIENT IDENTIFIER: [name] is a 34 y.o. female with a past medical history of T2DM and PCOS. The patient has followed with Endocrinology clinic since 09/06/2019 for consultative assistance with management of her diabetes.  DIABETIC HISTORY:  [name] was diagnosed with DM in 2016, she has been on metformin  since her diagnosis. She has required insulin  during 3rd trimester in 2018. Her hemoglobin A1c has ranged from 5.8% in 2018, peaking at 9.0% in 2019.  On her initial visit to our clinic her A1c was 9.8% she was on metformin  only, we started her on Glyburide  and gradual titration of the dose.   She was switched to MDI regimen based on OB recommendations 07/2020 Ozempic  started 01/2021 but was discontinued in preparation for pregnancy  Pt has a 38 yr old daughter  S/P C-section 04/13/2022- Kimberly Diaz  Started Mounjaro  05/2023 with an A1c of 10.7%, she was off basal insulin  by 08/2023   The patient tested positive for pregnancy in January, 2026  SUBJECTIVE:   During the last visit (07/04/2024): A1c 6.7%    Today (07/26/2024): [name] is here for a follow up on diabetes.  She has been checking  glucose  She has tested positive for pregnancy LMP  12/3rd/2025 , she is approximately a 6 weeks.  She was seen by OB but no pulse was detected at the time, she is Scheduled 2/2nd 2026 She has discontinued Mounjaro , and remains on metformin  No nausea  No constipation or diarrhea    HOME DIABETES REGIMEN:  Metformin  XR 750 mg, 2 tabs daily     METER DOWNLOAD SUMMARY:   07/26/2024 160 mg/dL  8/79/7973 3:58 pm  755  07/25/2024 8:14 157 mg/dL        DIABETIC COMPLICATIONS: Microvascular complications:    Denies: CKD, neuropathy  Last  eye exam: Completed 04/2024   Macrovascular complications:    Denies: CAD, PVD, CVA   HISTORY:  Past Medical History:  Past Medical History:  Diagnosis Date   Anxiety    Chronic back pain    muscle stiffness   Diabetes mellitus without complication (HCC)    metformin    Fatty liver    Hx of varicella    PCOS (polycystic ovarian syndrome)    possible    Past Surgical History:  Past Surgical History:  Procedure Laterality Date   CESAREAN SECTION N/A 11/06/2016   Procedure: CESAREAN SECTION;  Surgeon: Jolene Gaskins, MD;  Location: [facility];  Service: Obstetrics;  Laterality: N/A;   CESAREAN SECTION N/A 04/13/2022   Procedure: CESAREAN SECTION;  Surgeon: Gaskins Jolene, MD;  Location: [facility];  Service: Obstetrics;  Laterality: N/A;   EYE SURGERY     WISDOM TOOTH EXTRACTION  07/06/2010   Social History:  reports that she has never smoked. She has never used smokeless tobacco. She reports that she does not drink alcohol and does not use drugs. Family History:  Family History  Problem Relation Age of Onset   Hypertension Father    Diabetes Father    Cancer Maternal Grandfather    Heart disease Maternal Grandfather    Cancer Paternal Grandfather    Breast cancer Paternal Grandmother      HOME MEDICATIONS: Allergies  as of 07/26/2024   No Known Allergies      Medication List        Accurate as of July 26, 2024  7:16 AM. If you have any questions, ask your nurse or doctor.          citalopram  40 MG tablet Commonly known as: CELEXA  Take 1 tablet (40 mg total) by mouth daily.   [name] 24 Fe 1-20 MG-MCG(24) tablet Generic drug: Norethindrone Acetate-Ethinyl Estrad-FE Take 1 tablet by mouth daily.   metFORMIN  750 MG 24 hr tablet Commonly known as: GLUCOPHAGE -XR Take 2 tablets (1,500 mg total) by mouth daily with breakfast.   OneTouch Delica Plus Lancing Misc by Does not apply route.   OneTouch Ultra test strip Generic drug: glucose blood Use as  instructed to test blood sugar 2 times daily E11.9   onetouch ultrasoft lancets Use as instructed to test blood sugar 2 times daily E11.9   PRE-NATAL PO Take 1 tablet by mouth every evening.   PRENATAL + DHA PO         OBJECTIVE:   Vital Signs:There were no vitals taken for this visit.  Wt Readings from Last 3 Encounters:  07/04/24 186 lb (84.4 kg)  12/16/23 181 lb (82.1 kg)  08/18/23 195 lb (88.5 kg)     Exam: General: Pt appears well and is in NAD  Lungs: Clear with good BS bilat   Heart: RRR   Extremities: No pretibial edema.   Neuro: MS is good with appropriate affect, pt is alert and Ox3         DM foot exam: 07/04/2024    The skin of the feet is intact without sores or ulcerations. The pedal pulses are 2+ on right and 2+ on left. The sensation is intact to a screening 5.07, 10 gram monofilament bilaterally    DATA REVIEWED:  Lab Results  Component Value Date   HGBA1C 6.7 (A) 07/04/2024   HGBA1C 5.8 (A) 12/16/2023   HGBA1C 5.6 08/18/2023    Latest Reference Range & Units 12/16/23 09:34  Sodium 135 - 146 mmol/L 139  Potassium 3.5 - 5.3 mmol/L 4.7  Chloride 98 - 110 mmol/L 103  CO2 20 - 32 mmol/L 27  Glucose 65 - 99 mg/dL 855 (H)  BUN 7 - 25 mg/dL 6 (L)  Creatinine 9.49 - 0.97 mg/dL 9.48  Calcium 8.6 - 89.7 mg/dL 9.3  BUN/Creatinine Ratio 6 - 22 (calc) 12  eGFR > OR = 60 mL/min/1.57m2 126  Total CHOL/HDL Ratio <5.0 (calc) 3.6  Cholesterol <200 mg/dL 850  HDL Cholesterol > OR = 50 mg/dL 41 (L)  LDL Cholesterol (Calc) mg/dL (calc) 84  MICROALB/CREAT RATIO <30 mg/g creat 34 (H)  Non-HDL Cholesterol (Calc) <130 mg/dL (calc) 891  Triglycerides <150 mg/dL 855    Latest Reference Range & Units 12/16/23 09:34  TSH mIU/L 1.38    Latest Reference Range & Units 12/16/23 09:34  Microalb, Ur mg/dL 4.7  MICROALB/CREAT RATIO <30 mg/g creat 34 (H)  Creatinine, Urine 20 - 275 mg/dL 862    ASSESSMENT / PLAN / RECOMMENDATIONS:   1) Type 2 Diabetes  Mellitus, Optimally  controlled, With Microalbuminuria complications - Most recent A1c of 6.7%. Goal A1c < 7.0 %.    - I have reviewed glucose meter, patient has been noted with hyperglycemia specially during the day -Patient had preferred to use fingersticks rather than CGM technology in the past, I did encourage her to consider CGM technology if this is covered by  her insurance as the patient would need to check glucose 7 times a day and this will cause hardship with using a regular glucose meter. - I had recommended starting basal/prandial insulin .  Went to turn the idea of glyburide , but her A1c was above goal with glyburide  prior to her last pregnancy, we will also like the flexibility with postprandial hyperglycemia - The patient was advised to start basal insulin  and use NovoLog  per correction scale for the first 4 days, if her BGs are above goal for pregnancy she may start a standing dose of prandial insulin  of 4 units with each meal plus correction scale if needed  -We also discussed pregnancy glucose goals   TIMING  BLOOD SUGAR GOALS  FASTING (before breakfast)  60 - 95  BEFORE MEALS LESS THAN 100  2 hours AFTER MEAL LESS THAN 120     MEDICATIONS: - Continue metformin  750 mg XR to  2 tabs daily - Start Lantus  20 units daily - Take NovoLog  4 units with each meal - CF: NovoLog  (BG -100/35) TIDQAC and QHS  EDUCATION / INSTRUCTIONS: BG monitoring instructions: Patient is instructed to check her blood sugars 7 times a day, before each meal, 2 hours postprandial, and bedtime Call [facility] if: BG persistently < 70 I reviewed the Rule of 15 for the treatment of hypoglycemia in detail with the patient. Literature supplied.     2) Diabetic complications:  Eye: Does not have known diabetic retinopathy.  Neuro/ Feet: Does not have known diabetic peripheral neuropathy .  Renal: Patient does not have known baseline CKD.     3) Microalbuminuria:  - Trending  down, no change    F/U in 2 months    I spent 25 minutes preparing to see the patient by review of recent labs, imaging and procedures, obtaining and reviewing separately obtained history, communicating with the patient/family or caregiver, ordering medications, tests or procedures, and documenting clinical information in the EHR including the differential Dx, treatment, and any further evaluation and other management   Signed electronically by: Stefano Redgie Butts, MD  [facility] End[facility]  [facility] [address] [place] [place], [place] [address] Phone: [phone] FAX: [phone]   CC: Kimberly Comer BRAVO, MD [address] [place] [address] Phone: [phone]  Fax: [phone]  Return to Endocrinology clinic as below: Future Appointments  Date Time Provider Department Center  07/26/2024  9:30 AM Loraine Bhullar, Donell Redgie, MD [facility] None  01/02/2025  8:10 AM Sevanna Ballengee, Donell Redgie, MD [facility] None       "

## 2024-07-27 MED ORDER — INSULIN LISPRO (1 UNIT DIAL) 100 UNIT/ML (KWIKPEN): PEN_INJECTOR | SUBCUTANEOUS | 6 refills | Status: AC

## 2024-08-06 ENCOUNTER — Encounter: Payer: Self-pay | Admitting: Internal Medicine

## 2024-08-08 ENCOUNTER — Other Ambulatory Visit: Payer: Self-pay | Admitting: Internal Medicine

## 2024-08-08 MED ORDER — LANTUS SOLOSTAR 100 UNIT/ML ~~LOC~~ SOPN: 24.0000 [IU] | PEN_INJECTOR | Freq: Every day | SUBCUTANEOUS | 11 refills | Status: AC

## 2024-09-21 ENCOUNTER — Ambulatory Visit: Admitting: Internal Medicine

## 2025-01-02 ENCOUNTER — Ambulatory Visit: Admitting: Internal Medicine
